# Patient Record
Sex: Female | Born: 1965 | Race: White | Hispanic: No | Marital: Single | Smoking: Current every day smoker
Health system: Southern US, Community
[De-identification: ages and names within clinical notes are randomized; demographics above are authoritative.]

## PROBLEM LIST (undated history)

## (undated) DIAGNOSIS — L0390 Cellulitis, unspecified: Secondary | ICD-10-CM

## (undated) DIAGNOSIS — J189 Pneumonia, unspecified organism: Secondary | ICD-10-CM

## (undated) DIAGNOSIS — Z87442 Personal history of urinary calculi: Secondary | ICD-10-CM

## (undated) DIAGNOSIS — J449 Chronic obstructive pulmonary disease, unspecified: Secondary | ICD-10-CM

## (undated) DIAGNOSIS — M1990 Unspecified osteoarthritis, unspecified site: Secondary | ICD-10-CM

## (undated) DIAGNOSIS — F32A Depression, unspecified: Secondary | ICD-10-CM

## (undated) DIAGNOSIS — F329 Major depressive disorder, single episode, unspecified: Secondary | ICD-10-CM

## (undated) DIAGNOSIS — I10 Essential (primary) hypertension: Secondary | ICD-10-CM

## (undated) HISTORY — PX: OTHER SURGICAL HISTORY: SHX169

## (undated) HISTORY — PX: CLEFT LIP REPAIR: SUR1164

## (undated) HISTORY — PX: FRACTURE SURGERY: SHX138

## (undated) HISTORY — PX: TYMPANOSTOMY TUBE PLACEMENT: SHX32

---
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
## 1998-12-09 DIAGNOSIS — Z9889 Other specified postprocedural states: Secondary | ICD-10-CM

## 1998-12-09 HISTORY — DX: Other specified postprocedural states: Z98.890

## 2004-07-14 ENCOUNTER — Other Ambulatory Visit: Payer: Self-pay

## 2009-01-25 ENCOUNTER — Ambulatory Visit: Payer: Self-pay

## 2009-02-02 ENCOUNTER — Ambulatory Visit: Payer: Self-pay

## 2009-08-04 ENCOUNTER — Ambulatory Visit: Payer: Self-pay

## 2010-01-30 ENCOUNTER — Ambulatory Visit: Payer: Self-pay

## 2011-02-06 ENCOUNTER — Emergency Department: Payer: Self-pay | Admitting: Emergency Medicine

## 2013-03-06 ENCOUNTER — Emergency Department: Payer: Self-pay | Admitting: Emergency Medicine

## 2013-03-06 LAB — CBC WITH DIFFERENTIAL/PLATELET
Basophil %: 1 %
Eosinophil #: 0.2 10*3/uL (ref 0.0–0.7)
Lymphocyte #: 1.6 10*3/uL (ref 1.0–3.6)
Lymphocyte %: 9.3 %
MCH: 31.3 pg (ref 26.0–34.0)
MCHC: 33.4 g/dL (ref 32.0–36.0)
Monocyte #: 0.5 x10 3/mm (ref 0.2–0.9)
Monocyte %: 3.2 %
Neutrophil #: 14.3 10*3/uL — ABNORMAL HIGH (ref 1.4–6.5)
Neutrophil %: 85.1 %
RDW: 13 % (ref 11.5–14.5)

## 2013-03-06 LAB — URINALYSIS, COMPLETE
Bacteria: NONE SEEN
Bilirubin,UR: NEGATIVE
Glucose,UR: NEGATIVE mg/dL (ref 0–75)
Ketone: NEGATIVE
Nitrite: NEGATIVE
Protein: 30
Squamous Epithelial: 1
WBC UR: 13 /HPF (ref 0–5)

## 2013-03-06 LAB — COMPREHENSIVE METABOLIC PANEL
Albumin: 4.1 g/dL (ref 3.4–5.0)
Alkaline Phosphatase: 123 U/L (ref 50–136)
Anion Gap: 4 — ABNORMAL LOW (ref 7–16)
BUN: 16 mg/dL (ref 7–18)
Bilirubin,Total: 0.5 mg/dL (ref 0.2–1.0)
Calcium, Total: 9.1 mg/dL (ref 8.5–10.1)
Co2: 30 mmol/L (ref 21–32)
Creatinine: 0.64 mg/dL (ref 0.60–1.30)
EGFR (African American): >60
Glucose: 113 mg/dL — ABNORMAL HIGH (ref 65–99)
Osmolality: 281 (ref 275–301)
SGPT (ALT): 16 U/L (ref 12–78)
Sodium: 140 mmol/L (ref 136–145)
Total Protein: 7.5 g/dL (ref 6.4–8.2)

## 2013-03-06 LAB — LIPASE, BLOOD: Lipase: 212 U/L (ref 73–393)

## 2013-04-06 ENCOUNTER — Emergency Department: Payer: Self-pay | Admitting: Emergency Medicine

## 2013-04-06 LAB — URINALYSIS, COMPLETE
Glucose,UR: NEGATIVE mg/dL (ref 0–75)
Protein: 100
Specific Gravity: 1.02 (ref 1.003–1.030)
Squamous Epithelial: 2

## 2013-04-06 LAB — BASIC METABOLIC PANEL
Calcium, Total: 9.1 mg/dL (ref 8.5–10.1)
Creatinine: 0.54 mg/dL — ABNORMAL LOW (ref 0.60–1.30)
EGFR (African American): >60
EGFR (Non-African Amer.): >60
Glucose: 75 mg/dL (ref 65–99)

## 2013-04-06 LAB — CBC
HCT: 46.3 % (ref 35.0–47.0)
HGB: 15.8 g/dL (ref 12.0–16.0)
MCHC: 34 g/dL (ref 32.0–36.0)
Platelet: 274 10*3/uL (ref 150–440)

## 2013-04-12 DIAGNOSIS — N200 Calculus of kidney: Secondary | ICD-10-CM

## 2013-04-12 HISTORY — DX: Calculus of kidney: N20.0

## 2013-04-21 ENCOUNTER — Ambulatory Visit: Payer: Self-pay | Admitting: Urology

## 2013-05-05 ENCOUNTER — Ambulatory Visit: Payer: Self-pay

## 2013-05-07 ENCOUNTER — Ambulatory Visit: Payer: Self-pay | Admitting: Urology

## 2013-06-07 ENCOUNTER — Ambulatory Visit: Payer: Self-pay | Admitting: Urology

## 2013-06-16 ENCOUNTER — Ambulatory Visit: Payer: Self-pay

## 2014-07-05 ENCOUNTER — Emergency Department: Payer: Self-pay | Admitting: Internal Medicine

## 2014-07-05 LAB — CBC
HCT: 43 % (ref 35.0–47.0)
HGB: 14.2 g/dL (ref 12.0–16.0)
MCH: 31.5 pg (ref 26.0–34.0)
MCHC: 32.9 g/dL (ref 32.0–36.0)
MCV: 96 fL (ref 80–100)
Platelet: 252 10*3/uL (ref 150–440)
RBC: 4.5 10*6/uL (ref 3.80–5.20)
RDW: 13.9 % (ref 11.5–14.5)
WBC: 8 10*3/uL (ref 3.6–11.0)

## 2014-07-05 LAB — COMPREHENSIVE METABOLIC PANEL
ALK PHOS: 80 U/L
AST: 14 U/L — AB (ref 15–37)
Albumin: 4.2 g/dL (ref 3.4–5.0)
Anion Gap: 7 (ref 7–16)
BILIRUBIN TOTAL: 0.5 mg/dL (ref 0.2–1.0)
BUN: 21 mg/dL — AB (ref 7–18)
CREATININE: 0.91 mg/dL (ref 0.60–1.30)
Calcium, Total: 9.3 mg/dL (ref 8.5–10.1)
Chloride: 105 mmol/L (ref 98–107)
Co2: 27 mmol/L (ref 21–32)
EGFR (Non-African Amer.): >60
GLUCOSE: 92 mg/dL (ref 65–99)
Osmolality: 280 (ref 275–301)
POTASSIUM: 3.8 mmol/L (ref 3.5–5.1)
SGPT (ALT): 18 U/L
Sodium: 139 mmol/L (ref 136–145)
TOTAL PROTEIN: 7.3 g/dL (ref 6.4–8.2)

## 2014-07-05 LAB — URINALYSIS, COMPLETE
BILIRUBIN, UR: NEGATIVE
Glucose,UR: NEGATIVE mg/dL (ref 0–75)
Ketone: NEGATIVE
Nitrite: NEGATIVE
Ph: 5 (ref 4.5–8.0)
Protein: 30
Specific Gravity: 1.026 (ref 1.003–1.030)
Squamous Epithelial: 2
WBC UR: 43 /HPF (ref 0–5)

## 2014-09-03 ENCOUNTER — Inpatient Hospital Stay: Payer: Self-pay | Admitting: Internal Medicine

## 2014-09-03 LAB — CBC WITH DIFFERENTIAL/PLATELET
BASOS PCT: 0.6 %
Basophil #: 0.2 10*3/uL — ABNORMAL HIGH (ref 0.0–0.1)
EOS ABS: 0 10*3/uL (ref 0.0–0.7)
Eosinophil %: 0 %
HCT: 43.2 % (ref 35.0–47.0)
HGB: 14.4 g/dL (ref 12.0–16.0)
Lymphocyte #: 0.3 10*3/uL — ABNORMAL LOW (ref 1.0–3.6)
Lymphocyte %: 1.1 %
MCH: 31.7 pg (ref 26.0–34.0)
MCHC: 33.3 g/dL (ref 32.0–36.0)
MCV: 95 fL (ref 80–100)
MONO ABS: 0.6 x10 3/mm (ref 0.2–0.9)
Monocyte %: 1.8 %
NEUTROS ABS: 29.8 10*3/uL — AB (ref 1.4–6.5)
NEUTROS PCT: 96.5 %
PLATELETS: 226 10*3/uL (ref 150–440)
RBC: 4.54 10*6/uL (ref 3.80–5.20)
RDW: 13.1 % (ref 11.5–14.5)
WBC: 30.9 10*3/uL — AB (ref 3.6–11.0)

## 2014-09-03 LAB — URINALYSIS, COMPLETE
BILIRUBIN, UR: NEGATIVE
Bacteria: NONE SEEN
Glucose,UR: NEGATIVE mg/dL (ref 0–75)
Hyaline Cast: 10
Ketone: NEGATIVE
Nitrite: NEGATIVE
PH: 5 (ref 4.5–8.0)
RBC,UR: 31 /HPF (ref 0–5)
Specific Gravity: 1.024 (ref 1.003–1.030)
Squamous Epithelial: 3

## 2014-09-03 LAB — COMPREHENSIVE METABOLIC PANEL
Albumin: 4.1 g/dL (ref 3.4–5.0)
Alkaline Phosphatase: 82 U/L
Anion Gap: 9 (ref 7–16)
BUN: 23 mg/dL — AB (ref 7–18)
Bilirubin,Total: 1.4 mg/dL — ABNORMAL HIGH (ref 0.2–1.0)
CALCIUM: 8.6 mg/dL (ref 8.5–10.1)
Chloride: 100 mmol/L (ref 98–107)
Co2: 26 mmol/L (ref 21–32)
Creatinine: 1.26 mg/dL (ref 0.60–1.30)
EGFR (Non-African Amer.): 48 — ABNORMAL LOW
GFR CALC AF AMER: 58 — AB
GLUCOSE: 136 mg/dL — AB (ref 65–99)
Osmolality: 276 (ref 275–301)
Potassium: 3.8 mmol/L (ref 3.5–5.1)
SGOT(AST): 17 U/L (ref 15–37)
SGPT (ALT): 20 U/L
SODIUM: 135 mmol/L — AB (ref 136–145)
TOTAL PROTEIN: 7.6 g/dL (ref 6.4–8.2)

## 2014-09-03 LAB — TROPONIN I: Troponin-I: <0.02 ng/mL

## 2014-09-04 LAB — CBC WITH DIFFERENTIAL/PLATELET
Basophil #: 0.1 10*3/uL (ref 0.0–0.1)
Basophil %: 0.4 %
Eosinophil #: 0.1 10*3/uL (ref 0.0–0.7)
Eosinophil %: 0.8 %
HCT: 34.5 % — AB (ref 35.0–47.0)
HGB: 11.4 g/dL — ABNORMAL LOW (ref 12.0–16.0)
LYMPHS ABS: 0.8 10*3/uL — AB (ref 1.0–3.6)
Lymphocyte %: 6.6 %
MCH: 32.1 pg (ref 26.0–34.0)
MCHC: 33.2 g/dL (ref 32.0–36.0)
MCV: 97 fL (ref 80–100)
Monocyte #: 0.3 x10 3/mm (ref 0.2–0.9)
Monocyte %: 2.4 %
Neutrophil #: 11.3 10*3/uL — ABNORMAL HIGH (ref 1.4–6.5)
Neutrophil %: 89.8 %
Platelet: 155 10*3/uL (ref 150–440)
RBC: 3.57 10*6/uL — ABNORMAL LOW (ref 3.80–5.20)
RDW: 13.3 % (ref 11.5–14.5)
WBC: 12.6 10*3/uL — ABNORMAL HIGH (ref 3.6–11.0)

## 2014-09-04 LAB — BASIC METABOLIC PANEL
Anion Gap: 4 — ABNORMAL LOW (ref 7–16)
BUN: 12 mg/dL (ref 7–18)
Calcium, Total: 7.8 mg/dL — ABNORMAL LOW (ref 8.5–10.1)
Chloride: 109 mmol/L — ABNORMAL HIGH (ref 98–107)
Co2: 25 mmol/L (ref 21–32)
Creatinine: 0.7 mg/dL (ref 0.60–1.30)
EGFR (African American): >60
Glucose: 90 mg/dL (ref 65–99)
OSMOLALITY: 275 (ref 275–301)
Potassium: 3.9 mmol/L (ref 3.5–5.1)
Sodium: 138 mmol/L (ref 136–145)

## 2014-09-04 LAB — URINE CULTURE

## 2014-09-05 LAB — VANCOMYCIN, TROUGH: Vancomycin, Trough: 2 ug/mL — ABNORMAL LOW (ref 10–20)

## 2014-09-06 LAB — CBC WITH DIFFERENTIAL/PLATELET
BASOS PCT: 1.1 %
Basophil #: 0.1 10*3/uL (ref 0.0–0.1)
Eosinophil #: 0.3 10*3/uL (ref 0.0–0.7)
Eosinophil %: 4 %
HCT: 35.7 % (ref 35.0–47.0)
HGB: 11.7 g/dL — ABNORMAL LOW (ref 12.0–16.0)
LYMPHS ABS: 1.6 10*3/uL (ref 1.0–3.6)
LYMPHS PCT: 25.5 %
MCH: 31.4 pg (ref 26.0–34.0)
MCHC: 32.9 g/dL (ref 32.0–36.0)
MCV: 96 fL (ref 80–100)
MONOS PCT: 8.2 %
Monocyte #: 0.5 x10 3/mm (ref 0.2–0.9)
NEUTROS PCT: 61.2 %
Neutrophil #: 3.9 10*3/uL (ref 1.4–6.5)
PLATELETS: 184 10*3/uL (ref 150–440)
RBC: 3.73 10*6/uL — ABNORMAL LOW (ref 3.80–5.20)
RDW: 13 % (ref 11.5–14.5)
WBC: 6.4 10*3/uL (ref 3.6–11.0)

## 2014-09-06 LAB — BASIC METABOLIC PANEL
ANION GAP: 4 — AB (ref 7–16)
BUN: 15 mg/dL (ref 7–18)
CALCIUM: 8.2 mg/dL — AB (ref 8.5–10.1)
Chloride: 110 mmol/L — ABNORMAL HIGH (ref 98–107)
Co2: 27 mmol/L (ref 21–32)
Creatinine: 0.63 mg/dL (ref 0.60–1.30)
EGFR (African American): >60
EGFR (Non-African Amer.): >60
Glucose: 56 mg/dL — ABNORMAL LOW (ref 65–99)
Osmolality: 280 (ref 275–301)
Potassium: 3.5 mmol/L (ref 3.5–5.1)
SODIUM: 141 mmol/L (ref 136–145)

## 2014-09-08 LAB — CULTURE, BLOOD (SINGLE)

## 2014-10-10 ENCOUNTER — Emergency Department: Payer: Self-pay | Admitting: Emergency Medicine

## 2015-03-31 NOTE — Op Note (Signed)
PATIENT NAME:  Deanna Ramsey, Deanna Ramsey MR#:  [id number] DATE OF BIRTH:  09-02-1966  DATE OF SURGERY:  04/21/2013  PREOPERATIVE DIAGNOSIS: Right renal pelvic calculus.  POSTOPERATIVE DIAGNOSIS: Right renal pelvic calculus.  PROCEDURES: 1.  Right ureteropyeloscopy, with holmium laser lithotripsy.  2.  Placement, right ureteral stent.   SURGEON: John Giovanni, M.D.   ASSISTANT: None.   ANESTHESIA: General.   INDICATIONS: A 49 year old female who presented to the Emergency Department with right renal colic. CT scan showed ab approximately 15 mm right renal pelvic stone. After discussion of our of treatment options, she initially elected lithotripsy, however due to financial constraints has elected to proceed with ureteroscopy.   DESCRIPTION: She was taken to the cystoscopy suite and placed in supine position. General anesthetic was administered via an endotracheal tube. She was placed in the low lithotomy position and her external genitalia were prepped and draped in the usual fashion. A time-out was performed per protocol, with all in agreement. A 21-French cystoscope sheath with obturator was lubricated and passed without difficulty. Panendoscopy was performed, and the bladder mucosa was normal in appearance, without erythema, solid or papillary lesions. The left ureteral orifice was effluxing clear urine. No efflux was seen from the right ureteral orifice.   A 0.035 guidewire was placed through the cystoscope and into the right ureter and passed up into the right renal pelvis past the stone without difficulty. A second 0.035 guidewire was passed and the cystoscope was removed. A ureteral access sheath would not advance beyond the ureteral orifice, and was buckling. The distal ureter subsequently dilated with over-the-wire ureteral dilators from 8- to 14-French. The ureteral access sheath would still not advance. A flexible ureteroscope was then placed over the wire and advanced up the ureter without  difficulty into the renal pelvis. A crystalline stone was easily identified in the renal pelvis. A 276-micron holmium laser fiber was placed through the ureteroscope. The stone was easily fragmented in multiple small fragments at a power setting of 5 watts. Under direct vision and fluoroscopy, no large fragments were identified. The ureteroscope was removed. A 6-French, 24 cm Microvasive Contour ureteral stent was placed. The proximal end of the stent was well-positioned in the renal pelvis. The distal end of the stent was well-positioned in the bladder under direct vision. A B and O suppository was placed per rectum. She was taken to the PACU in stable condition. There were no complications.   EBL minimal.     ____________________________ Ronda Fairly. Bernardo Heater, MD scs:dm D: 04/22/2013 12:40:00 ET T: 04/22/2013 14:35:48 ET JOB#: [id number]  cc: Nicki Reaper C. Bernardo Heater, MD, <Dictator> Abbie Sons MD ELECTRONICALLY SIGNED 04/23/2013 7:40

## 2015-04-01 NOTE — H&P (Signed)
PATIENT NAME:  Deanna Ramsey, MEINERS MR#:  [id number] DATE OF BIRTH:  [birth year]  DATE OF ADMISSION:  09/03/2014  ADMITTING PHYSICIAN: Gladstone Lighter, MD.    PRIMARY CARE PHYSICIAN: None.   CHIEF COMPLAINT: Pain, fever, and chills.   HISTORY OF PRESENT ILLNESS: Deanna Ramsey is a 49 year old Caucasian female with no significant past medical history who presents from home secondary to fever and chills that started yesterday. The patient said she felt like she wore some tight jeans the day before yesterday and had some itching and tenderness in her pubic area. Yesterday morning she woke up with fever, chills, and noted that she could not walk because of painful lymph nodes in her groin. She noticed last night that she had redness and rash in her pubic area extending into the perineum that she was not able to see, and presented to the hospital this morning. She has a fever of 99.6, elevated lactic, tachycardia, and hypertension, is being admitted for sepsis.   PAST MEDICAL HISTORY: None. No chronic medical problems.   PAST SURGICAL HISTORY:  1.  She had right cervical lymph nodes taken out of uncertain etiology.  2.  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   3.  Cleft lip surgery.  4.  Lithotripsies, a few.   MEDICATIONS AT HOME: None.   ALLERGIES TO MEDICATIONS: SULFA.    SOCIAL HISTORY: Lives at home with her brother-in-law. Smokes about 1 pack per day of cigarettes. Denies any alcohol use. Works at an assisted living facility.   FAMILY HISTORY: Both parents with COPD and they have passed away.    REVIEW OF SYSTEMS:  CONSTITUTIONAL: Positive for fever, fatigue, weakness. No weight loss or weight gain.  EYES: No blurred vision, double vision, inflammation, or glaucoma.  ENT: No tinnitus, ear pain, hearing loss, epistaxis, or discharge.  RESPIRATORY: No cough, wheeze, hemoptysis, or COPD.   CARDIOVASCULAR: No chest pain, orthopnea, edema, arrhythmia,  palpitations, or syncope.  GASTROINTESTINAL: Positive for nausea. No vomiting, diarrhea, abdominal pain, hematemesis, or melena.  GENITOURINARY: Positive for dysuria. No nocturia, renal calculus, frequency or incontinence.  ENDOCRINE: No polyuria, nocturia, thyroid problems, heat or cold intolerance.  HEMATOLOGY: No anemia, easy bruising or bleeding.  SKIN: No acne, rash, or lesions.  MUSCULOSKELETAL: No neck, back, shoulder pain, arthritis, or gout.  NEUROLOGIC: No numbness, weakness, CVA, TIA, or seizures.  PSYCHOLOGICAL: No anxiety, insomnia, depression.   PHYSICAL EXAMINATION: VITAL SIGNS: Temperature 99.6 degrees Fahrenheit, pulse 97, respirations 18, blood pressure 134/77, pulse oximetry 99% on room air.  GENERAL: Well-built, well-nourished female lying in bed in mild acute distress secondary to pain in the groin region.  HEENT: Normocephalic, atraumatic. Pupils equal, round, reacting to light. Anicteric sclerae. Extraocular movements intact.  OROPHARYNX: Clear without erythema, mass, or exudates.  NECK: Supple. No thyromegaly, JVD, or carotid bruits. No lymphadenopathy.  LUNGS: Moving air bilaterally. No wheeze or crackles. No use of accessory muscles for breathing.  CARDIOVASCULAR: S1, S2, regular rate and rhythm. No murmurs, rubs, or gallops.  ABDOMEN: Soft, nontender, nondistended. No hepatosplenomegaly. Normal bowel sounds.  EXTREMITIES: No pedal edema. No clubbing or cyanosis, 2+ dorsalis pedis pulses palpable bilaterally.  SKIN: She has well-defined erythematous rash on the pubic area extending into the lower abdomen, but going more towards the perineum involving her labia going posteriorly. No perianal rash noted.  LYMPHATICS: She has tender inguinal lymphadenopathy. No vaginal discharge noted.  NEUROLOGIC: Cranial nerves intact. No focal motor or sensory deficits.  PSYCHOLOGICAL: The patient is awake, alert, oriented x 3.   LABORATORY DATA: WBC 30.9, hemoglobin 14.4,  hematocrit 43.2, platelet count 226,000.  Sodium 135, potassium 3.8, chloride 100, bicarbonate 26, BUN 23, creatinine 1.2, glucose 136, and calcium of 8.6.   ALT 20, AST 17, alkaline phosphatase 82, total bilirubin 1.4, albumin of 4.1. Troponin is negative.   Chest x-ray, clear lung fields. No acute cardiopulmonary disease.   Urinalysis with 37 WBCs, 2+ leukocyte esterase. Lactic acid slightly elevated at 2.4. CT of the abdomen and pelvis showing skin subcutaneous edema in the anterior pelvic region. No acute abnormality otherwise in the abdomen or pelvis. Multiple cysts in the kidneys. Nonobstructive kidney stones noted.   BRIEF ASSESSMENT AND PLAN: A 49 year old female with no chronic medical problems being admitted for sepsis.  1.  Sepsis, likely from cellulitis of the pubic area extending into the perineum. IV fluids. Blood cultures have been ordered and also start her on IV antibiotics with vancomycin and Zosyn. Urine slightly infected. Follow up cultures on antibiotics.  2.  Tobacco use disorder. Counseled against smoking for 3 minutes. Will start on Nicotrol inhaler.  3.  Deep vein thrombosis prophylaxis with subcutaneous heparin.   CODE STATUS: Full code.   TIME SPENT ON ADMISSION: 50 minutes.    ____________________________ Gladstone Lighter, MD rk:at D: 09/03/2014 12:12:15 ET T: 09/03/2014 12:51:28 ET JOB#: [id number]  cc: Gladstone Lighter, MD, <Dictator> Gladstone Lighter MD ELECTRONICALLY SIGNED 09/08/2014 9:51

## 2015-04-01 NOTE — Discharge Summary (Signed)
PATIENT NAME:  Deanna Ramsey, Deanna Ramsey MR#:  [id number] DATE OF BIRTH:  1966/08/12  DATE OF ADMISSION:  09/03/2014 DATE OF DISCHARGE:  09/06/2014  ADMITTING DIAGNOSES:   1.  Sepsis secondary to cellulitis of the perineal area.  2.  Tobacco use disorder.   DISCHARGE DIAGNOSES:  1.  Sepsis from perineal area cellulitis with edema, significantly improved with IV antibiotics, Zosyn and vancomycin.  2.  Tobacco use disorder. Consultation to quit smoking.   CONSULTATIONS: None.   PROCEDURES: None.   HOSPITAL COURSE: The patient is a 49 year old Caucasian female with no significant past medical history. She came to the ED with a chief complaint of fever, pain and chills. The patient was complaining of itching in the perineal area and was having difficulty while walking. Please see history and physical for details.   The patient was admitted to the hospital with a diagnosis of cellulitis. She was started on IV antibiotics, vancomycin and Zosyn. Cultures were obtained.   HOSPITAL COURSE:  With IV Zosyn and vancomycin her cellulitis significantly improved. Erythema and tenderness improved. Blood cultures were negative and urine cultures were  contaminated. Benadryl was provided for itching. As her clinical situation significantly improved IV antibiotics were switched to p.o. antibiotics and the patient is to discharged home with doxycycline and probiotic.   The patient was counseled to quit smoking and recommended to continue nicotine inhalation device.   Overall her condition was stable condition. Condition at the time of discharge stable.   LABORATORY AND IMAGING STUDIES: WBC 30.9 at the time of admission, 6.4 on September 29, hemoglobin 11.3, hematocrit 35.7. Blood culture no growth x 2. Lactic acid at the time of admission 2.4. BMP: Glucose 56, BUN, creatinine, sodium, and potassium are normal. Anion gap is at 4, calcium 8.2, serum osmolality normal.   Urine culture mixed bacteria which is probably a  contaminant.   MEDICATIONS AT THE TIME OF DISCHARGE: Doxycycline 100 mg p.o. q. 12 hours for 7 days, probiotic 1 capsule p.o. 2 times a day for 10 days, nicotine inhaler  to be continued. Benadryl 25 mg p.o. q. 6 hours as needed for itching, ibuprofen 400 mg p.o. every 6 hours as needed for pain and swelling with food, Colace 100 mg p.o. 2 times a day as needed for constipation.   OUTPATIENT FOLLOWUP: With new primary care physician in 1 week. Return to work in 5 days, Next Monday, October 5.  Work note was given.   ACTIVITY: As tolerated.   DIET: Regular diet.   Plan of care discussed with the patient. She is aware of the plan.   Total time spent on the discharge: 45 minutes.    ____________________________ Nicholes Mango, MD ag:lt D: 09/06/2014 13:12:09 ET T: 09/06/2014 14:20:05 ET JOB#: [id number]  cc: Nicholes Mango, MD, <Dictator> Nicholes Mango MD ELECTRONICALLY SIGNED 09/09/2014 11:17

## 2015-04-15 ENCOUNTER — Inpatient Hospital Stay: Payer: Self-pay

## 2015-04-15 ENCOUNTER — Inpatient Hospital Stay
Admission: EM | Admit: 2015-04-15 | Discharge: 2015-04-18 | DRG: 872 | Disposition: A | Discharge status: Home / Self Care | Payer: Self-pay | Attending: Internal Medicine | Admitting: Internal Medicine

## 2015-04-15 ENCOUNTER — Encounter: Payer: Self-pay | Admitting: Emergency Medicine

## 2015-04-15 DIAGNOSIS — D72829 Elevated white blood cell count, unspecified: Secondary | ICD-10-CM | POA: Diagnosis present

## 2015-04-15 DIAGNOSIS — L03315 Cellulitis of perineum: Secondary | ICD-10-CM

## 2015-04-15 DIAGNOSIS — A400 Sepsis due to streptococcus, group A: Principal | ICD-10-CM | POA: Diagnosis present

## 2015-04-15 DIAGNOSIS — F329 Major depressive disorder, single episode, unspecified: Secondary | ICD-10-CM | POA: Diagnosis present

## 2015-04-15 DIAGNOSIS — F17210 Nicotine dependence, cigarettes, uncomplicated: Secondary | ICD-10-CM | POA: Diagnosis present

## 2015-04-15 DIAGNOSIS — M1990 Unspecified osteoarthritis, unspecified site: Secondary | ICD-10-CM | POA: Diagnosis present

## 2015-04-15 DIAGNOSIS — E876 Hypokalemia: Secondary | ICD-10-CM | POA: Diagnosis present

## 2015-04-15 DIAGNOSIS — L03314 Cellulitis of groin: Secondary | ICD-10-CM | POA: Diagnosis present

## 2015-04-15 DIAGNOSIS — L0390 Cellulitis, unspecified: Secondary | ICD-10-CM | POA: Diagnosis present

## 2015-04-15 DIAGNOSIS — Z882 Allergy status to sulfonamides status: Secondary | ICD-10-CM

## 2015-04-15 HISTORY — DX: Depression, unspecified: F32.A

## 2015-04-15 HISTORY — DX: Major depressive disorder, single episode, unspecified: F32.9

## 2015-04-15 HISTORY — DX: Cellulitis, unspecified: L03.90

## 2015-04-15 HISTORY — DX: Unspecified osteoarthritis, unspecified site: M19.90

## 2015-04-15 LAB — BASIC METABOLIC PANEL
ANION GAP: 9 (ref 5–15)
BUN: 21 mg/dL — ABNORMAL HIGH (ref 6–20)
CHLORIDE: 102 mmol/L (ref 101–111)
CO2: 25 mmol/L (ref 22–32)
CREATININE: 0.77 mg/dL (ref 0.44–1.00)
Calcium: 8.8 mg/dL — ABNORMAL LOW (ref 8.9–10.3)
GFR calc Af Amer: >60 mL/min (ref >60)
GFR calc non Af Amer: >60 mL/min (ref >60)
Glucose, Bld: 96 mg/dL (ref 65–99)
Potassium: 4 mmol/L (ref 3.5–5.1)
SODIUM: 136 mmol/L (ref 135–145)

## 2015-04-15 LAB — CBC
HEMATOCRIT: 45.9 % (ref 35.0–47.0)
HEMOGLOBIN: 15 g/dL (ref 12.0–16.0)
MCH: 31.4 pg (ref 26.0–34.0)
MCHC: 32.8 g/dL (ref 32.0–36.0)
MCV: 95.9 fL (ref 80.0–100.0)
Platelets: 322 10*3/uL (ref 150–440)
RBC: 4.79 MIL/uL (ref 3.80–5.20)
RDW: 12.6 % (ref 11.5–14.5)
WBC: 16.6 10*3/uL — ABNORMAL HIGH (ref 3.6–11.0)

## 2015-04-15 MED ORDER — ONDANSETRON HCL 4 MG PO TABS
4.0000 mg | ORAL_TABLET | Freq: Four times a day (QID) | ORAL | Status: DC | PRN
  Filled 2015-04-15: qty 1

## 2015-04-15 MED ORDER — MORPHINE SULFATE 2 MG/ML IJ SOLN
2.0000 mg | INTRAMUSCULAR | Status: DC | PRN
  Administered 2015-04-16 – 2015-04-18 (×13): 2 mg via INTRAVENOUS
  Filled 2015-04-15 (×13): qty 1

## 2015-04-15 MED ORDER — VANCOMYCIN HCL IN DEXTROSE 1-5 GM/200ML-% IV SOLN
INTRAVENOUS | Status: AC
  Filled 2015-04-15: qty 200

## 2015-04-15 MED ORDER — HEPARIN SODIUM (PORCINE) 5000 UNIT/ML IJ SOLN
5000.0000 [IU] | Freq: Three times a day (TID) | INTRAMUSCULAR | Status: DC
  Administered 2015-04-15 – 2015-04-18 (×8): 5000 [IU] via SUBCUTANEOUS
  Filled 2015-04-15 (×12): qty 1

## 2015-04-15 MED ORDER — OXYCODONE-ACETAMINOPHEN 5-325 MG PO TABS
ORAL_TABLET | ORAL | Status: AC
  Filled 2015-04-15: qty 1

## 2015-04-15 MED ORDER — SODIUM CHLORIDE 0.9 % IV BOLUS (SEPSIS)
1000.0000 mL | Freq: Once | INTRAVENOUS | Status: AC
  Administered 2015-04-15: 1000 mL via INTRAVENOUS

## 2015-04-15 MED ORDER — ONDANSETRON HCL 4 MG/2ML IJ SOLN
4.0000 mg | Freq: Once | INTRAMUSCULAR | Status: AC
  Administered 2015-04-15: 4 mg via INTRAVENOUS

## 2015-04-15 MED ORDER — MORPHINE SULFATE 2 MG/ML IJ SOLN
2.0000 mg | Freq: Once | INTRAMUSCULAR | Status: AC
  Administered 2015-04-15: 2 mg via INTRAVENOUS

## 2015-04-15 MED ORDER — VANCOMYCIN HCL IN DEXTROSE 1-5 GM/200ML-% IV SOLN
1000.0000 mg | Freq: Once | INTRAVENOUS | Status: AC
  Administered 2015-04-15: 1000 mg via INTRAVENOUS

## 2015-04-15 MED ORDER — ONDANSETRON HCL 4 MG/2ML IJ SOLN
4.0000 mg | Freq: Four times a day (QID) | INTRAMUSCULAR | Status: DC | PRN
  Administered 2015-04-16 – 2015-04-17 (×3): 4 mg via INTRAVENOUS
  Filled 2015-04-15 (×3): qty 2

## 2015-04-15 MED ORDER — IOHEXOL 300 MG/ML  SOLN
100.0000 mL | Freq: Once | INTRAMUSCULAR | Status: AC | PRN
  Administered 2015-04-15: 100 mL via INTRAVENOUS

## 2015-04-15 MED ORDER — SODIUM CHLORIDE 0.9 % IV SOLN
INTRAVENOUS | Status: DC
  Administered 2015-04-15 – 2015-04-16 (×2): via INTRAVENOUS
  Administered 2015-04-16: 1000 mL via INTRAVENOUS
  Administered 2015-04-17 – 2015-04-18 (×3): via INTRAVENOUS

## 2015-04-15 MED ORDER — PIPERACILLIN-TAZOBACTAM 3.375 G IVPB
3.3750 g | Freq: Once | INTRAVENOUS | Status: AC
  Administered 2015-04-15: 3.375 g via INTRAVENOUS

## 2015-04-15 MED ORDER — ACETAMINOPHEN 325 MG PO TABS
650.0000 mg | ORAL_TABLET | Freq: Four times a day (QID) | ORAL | Status: DC | PRN
  Administered 2015-04-17 – 2015-04-18 (×2): 650 mg via ORAL
  Filled 2015-04-15 (×2): qty 2

## 2015-04-15 MED ORDER — PIPERACILLIN-TAZOBACTAM 3.375 G IVPB
INTRAVENOUS | Status: AC
  Filled 2015-04-15: qty 50

## 2015-04-15 MED ORDER — ACETAMINOPHEN 650 MG RE SUPP
650.0000 mg | Freq: Four times a day (QID) | RECTAL | Status: DC | PRN
  Filled 2015-04-15: qty 1

## 2015-04-15 MED ORDER — ONDANSETRON HCL 4 MG/2ML IJ SOLN
INTRAMUSCULAR | Status: AC
  Filled 2015-04-15: qty 2

## 2015-04-15 MED ORDER — OXYCODONE-ACETAMINOPHEN 5-325 MG PO TABS
1.0000 | ORAL_TABLET | Freq: Once | ORAL | Status: AC
  Administered 2015-04-15: 1 via ORAL

## 2015-04-15 MED ORDER — VANCOMYCIN HCL IN DEXTROSE 1-5 GM/200ML-% IV SOLN: 1000.0000 mg | INTRAVENOUS | Status: DC

## 2015-04-15 MED ORDER — MORPHINE SULFATE 2 MG/ML IJ SOLN
INTRAMUSCULAR | Status: AC
  Filled 2015-04-15: qty 1

## 2015-04-15 NOTE — ED Notes (Signed)
Redness over mons pubis and perineal area, states was hospitalized in September for the same

## 2015-04-15 NOTE — ED Provider Notes (Signed)
[facility] Emergency Department Provider Note  ____________________________________________  Time seen: 8:15 PM  I have reviewed the triage vital signs and the nursing notes.   HISTORY  Chief Complaint Cellulitis      HPI Deanna Ramsey is a 49 y.o. female presents with "cellulitis on pelvis. Patient states redness pain noted suprapubic and extending down towards the vagina. Fever at home. Of note patient recalls similar incident of same was diagnosis cellulitis to progress to sepsis. Pain at present 10 out of 10.     Past Medical History  Diagnosis Date  . Depression   . Cellulitis     Patient Active Problem List   Diagnosis Date Noted  . Panniculitis 04/15/2015    No past surgical history on file.  No current outpatient prescriptions on file.  Allergies Sulfa antibiotics  No family history on file.  Social History History  Substance Use Topics  . Smoking status: Current Every Day Smoker -- 1.00 packs/day    Types: Cigarettes  . Smokeless tobacco: Not on file  . Alcohol Use: No    Review of Systems  Constitutional: Negative for fever. Eyes: Negative for visual changes. ENT: Negative for sore throat. Cardiovascular: Negative for chest pain. Respiratory: Negative for shortness of breath. Gastrointestinal: Negative for abdominal pain, vomiting and diarrhea. Genitourinary: Negative for dysuria. Musculoskeletal: Negative for back pain. Skin: Negative for rash. Neurological: Negative for headaches, focal weakness or numbness. Psychiatric: Unremarkable  10-point ROS otherwise negative.  ____________________________________________   PHYSICAL EXAM:  VITAL SIGNS: ED Triage Vitals  Enc Vitals Group     BP 04/15/15 1624 162/90 mmHg     Pulse Rate 04/15/15 1624 98     Resp 04/15/15 1624 19     Temp 04/15/15 1624 100.8 F (38.2 C)     Temp Source 04/15/15 1624 Oral     SpO2 04/15/15 1624 98 %     Weight 04/15/15 1624 108 lb  (48.988 kg)     Height 04/15/15 1624 5\' 5"  (1.651 m)     Head Cir --      Peak Flow --      Pain Score 04/15/15 1626 8     Pain Loc --      Pain Edu? --      Excl. in [place]? --      Constitutional: Alert and oriented. Well appearing and in no distress. Eyes: Conjunctivae are normal. PERRL. Normal extraocular movements. ENT   Head: Normocephalic and atraumatic.   Nose: No congestion/rhinnorhea.   Mouth/Throat: Mucous membranes are moist.   Neck: No stridor. Hematological/Lymphatic/Immunilogical: No cervical lymphadenopathy. Cardiovascular: Normal rate, regular rhythm. Normal and symmetric distal pulses are present in all extremities. No murmurs, rubs, or gallops. Respiratory: Normal respiratory effort without tachypnea nor retractions. Breath sounds are clear and equal bilaterally. No wheezes/rales/rhonchi. Gastrointestinal: Soft and nontender. No distention. There is no CVA tenderness. Genitourinary: deferred Musculoskeletal: Nontender with normal range of motion in all extremities. No joint effusions.  No lower extremity tenderness nor edema. Neurologic:  Normal speech and language. No gross focal neurologic deficits are appreciated. Speech is normal.  Skin:  Cellulitis suprapubic just inferior to the umbilicus extending down to the perineum Psychiatric: Mood and affect are normal. Speech and behavior are normal. Patient exhibits appropriate insight and judgment.  ____________________________________________    LABS (pertinent positives/negatives)  Labs Reviewed  CBC - Abnormal; Notable for the following:    WBC 16.6 (*)    All other components within normal limits  BASIC METABOLIC PANEL - Abnormal; Notable for the following:    BUN 21 (*)    Calcium 8.8 (*)    All other components within normal limits  CULTURE, BLOOD (ROUTINE X 2)  CULTURE, BLOOD (ROUTINE X 2)  CBC  CREATININE, SERUM  BASIC METABOLIC PANEL  CBC      ____________________________________________   EKG  None performed  ____________________________________________    RADIOLOGY  Performed  ____________________________________________  Critical care note: 30 minutes  ____________________________________________   INITIAL IMPRESSION / ASSESSMENT AND PLAN / ED COURSE  Pertinent labs & imaging results that were available during my care of the patient were reviewed by me and considered in my medical decision making (see chart for details).  Given history and physical exam consistent with cellulitis of the perineum IV vancomycin 1 g and IV Zosyn 3.375 mg given patient noted to be tachycardic and febrile. Concerns for SIRS ____________________________________________   FINAL CLINICAL IMPRESSION(S) / ED DIAGNOSES  Final diagnoses:  Cellulitis of perineum      Gregor Hams, MD 04/19/15 [phone]

## 2015-04-15 NOTE — H&P (Signed)
[place] [facility] [place] NAME: Deanna Ramsey    MR#:  [id number]  DATE OF BIRTH:  02/25/1966   DATE OF ADMISSION:  04/15/2015  PRIMARY CARE PHYSICIAN: No primary care provider on file.   REQUESTING/REFERRING PHYSICIAN: Owens Shark  CHIEF COMPLAINT:   Chief Complaint  Patient presents with  . Cellulitis    cellulitis groin area noted this am, history of sepsis with same type of symptoms 9/16    HISTORY OF PRESENT ILLNESS:  Deanna Ramsey  is a 49 y.o. female without significant past medical history presenting with skin redness. She describes one day duration of redness around her vagina moving proximally, warm to touch, painful, described as only "pain" intensity 10 of 10 nonradiating, worsened with movements, no relieving factors. Also has subjective fevers chills and nausea without vomiting. Emergency department course noted to have elevated white count as well as febrile  PAST MEDICAL HISTORY:   Past Medical History  Diagnosis Date  . Depression   . Cellulitis     PAST SURGICAL HISTORY:  History reviewed. No pertinent past surgical history.  SOCIAL HISTORY:   History  Substance Use Topics  . Smoking status: Current Every Day Smoker -- 1.00 packs/day    Types: Cigarettes  . Smokeless tobacco: Not on file  . Alcohol Use: No    FAMILY HISTORY:   Family History  Problem Relation Age of Onset  . COPD Other     DRUG ALLERGIES:   Allergies  Allergen Reactions  . Sulfa Antibiotics Nausea And Vomiting    REVIEW OF SYSTEMS:  REVIEW OF SYSTEMS:  CONSTITUTIONAL:Positive fors, chills, fatigue, weakness.  EYES: Denies blurred vision, double vision, or eye pain.  EARS, NOSE, THROAT: Denies tinnitus, ear pain, hearing loss.  RESPIRATORY: denies cough, shortness of breath, wheezing  CARDIOVASCULAR: Denies chest pain, palpitations, edema.  GASTROINTESTINAL:Positive nausea,  denies vomiting, diarrhea,positive for  abdominal pain as  described above  GENITOURINARY: Denies dysuria, hematuria. No vaginal discharge  ENDOCRINE: Denies nocturia or thyroid problems. HEMATOLOGIC AND LYMPHATIC: Denies easy bruising or bleeding.  SKIN:Positive for erythematous lesion as stated above no further skin changes Musculoskeletal: Denies pain in neck, back, shoulder, knees, hips, or further arthritic symptoms.  NEUROLOGIC: Denies paralysis, paresthesias.  PSYCHIATRIC: Denies anxiety or depressive symptoms. Otherwise full review of systems performed by me is negative.   MEDICATIONS AT HOME:   Prior to Admission medications   Not on File      VITAL SIGNS:  Blood pressure 109/52, pulse 87, temperature 100.3 F (37.9 C), temperature source Oral, resp. rate 20, height 5\' 5"  (1.651 m), weight 108 lb (48.988 kg), SpO2 97 %.  PHYSICAL EXAMINATION:  VITAL SIGNS: Filed Vitals:   04/15/15 2012  BP: 109/52  Pulse: 87  Temp:   Resp: 49   GENERAL:48 y.o.female currently in no acute distress.  HEAD: Normocephalic, atraumatic.  EYES: Pupils equal, round, reactive to light. Extraocular muscles intact. No scleral icterus.  MOUTH:Dry mucosall membrane. Dentition poor. No abscess noted.  EAR, NOSE, THROAT: Clear without exudates. No external lesions.  NECK: Supple. No thyromegaly. No nodules. No JVD.  PULMONARY: Clear to ascultation, without wheeze rails or rhonci. No use of accessory muscles, Good respiratory effort. good air entry bilaterally CHEST: Nontender to palpation.  CARDIOVASCULAR: S1 and S2. Regular rate and rhythm. No murmurs, rubs, or gallops. No edema. Pedal pulses 2+ bilaterally.  GASTROINTESTINAL: Soft, nontender, nondistended. No masses. Positive bowel sounds. No hepatosplenomegaly.  MUSCULOSKELETAL: No swelling,  clubbing, or edema. Range of motion full in all extremities.  NEUROLOGIC: Cranial nerves II through XII are intact. No gross focal neurological deficits. Sensation intact. Reflexes intact.  SKIN:Erythematous  lesion starting at vagina moving proximally to suprapubic region warm to touch tenderness on palpation associated painful femoral lymphadenopathy otherwise  Skin warm and dry. Turgor intact.  PSYCHIATRIC: Mood, affect within normal limits. The patient is awake, alert and oriented x 3. Insight, judgment intact.    LABORATORY PANEL:   CBC  Recent Labs Lab 04/15/15 1640  WBC 16.6*  HGB 15.0  HCT 45.9  PLT 322   ------------------------------------------------------------------------------------------------------------------  Chemistries   Recent Labs Lab 04/15/15 1640  NA 136  K 4.0  CL 102  CO2 25  GLUCOSE 96  BUN 21*  CREATININE 0.77  CALCIUM 8.8*   ------------------------------------------------------------------------------------------------------------------  Cardiac Enzymes No results for input(s): TROPONINI in the last 168 hours. ------------------------------------------------------------------------------------------------------------------  RADIOLOGY:  No results found.  EKG:  No orders found for this or any previous visit.  IMPRESSION AND PLAN:   49 year old Caucasian female without significant past medical history presenting with a one-day duration of skin redness and pain.  1. Cellulitis, suprapubic: Received vancomycin as well as Zosyn by emergency department staff, continue vancomycin coverage adjust antibiotics according to culture data when it returns, provide pain medication as required, IV fluid hydration to keep mean arterial pressure greater than 65. 2. Venous thromboembolism prophylactic: Heparin subcutaneous    All the records are reviewed and case discussed with ED provider. Management plans discussed with the patient, family and they are in agreement.  CODE STATUS: Full  TOTAL TIME TAKING CARE OF THIS PATIENT: 35 minutes.    Dezaray Shibuya,  Karenann Cai.D on 04/15/2015 at 8:59 PM  Between 7am to 6pm - Pager - [phone]  After 6pm: House  Pager: - [phone]  [facility]  Office  [phone]  CC: Primary care physician; No primary care provider on file.

## 2015-04-15 NOTE — ED Notes (Signed)
MD Brown at bedside.

## 2015-04-15 NOTE — ED Notes (Signed)
Pt requesting additional zofran, given per protocol order.

## 2015-04-16 ENCOUNTER — Encounter: Payer: Self-pay | Admitting: *Deleted

## 2015-04-16 LAB — CBC
HEMATOCRIT: 36.9 % (ref 35.0–47.0)
HEMOGLOBIN: 12.5 g/dL (ref 12.0–16.0)
MCH: 32.4 pg (ref 26.0–34.0)
MCHC: 34 g/dL (ref 32.0–36.0)
MCV: 95.3 fL (ref 80.0–100.0)
Platelets: 215 10*3/uL (ref 150–440)
RBC: 3.87 MIL/uL (ref 3.80–5.20)
RDW: 12.8 % (ref 11.5–14.5)
WBC: 27.1 10*3/uL — AB (ref 3.6–11.0)

## 2015-04-16 LAB — BASIC METABOLIC PANEL
ANION GAP: 6 (ref 5–15)
BUN: 14 mg/dL (ref 6–20)
CO2: 23 mmol/L (ref 22–32)
CREATININE: 0.64 mg/dL (ref 0.44–1.00)
Calcium: 7.8 mg/dL — ABNORMAL LOW (ref 8.9–10.3)
Chloride: 107 mmol/L (ref 101–111)
GFR calc Af Amer: >60 mL/min (ref >60)
GFR calc non Af Amer: >60 mL/min (ref >60)
GLUCOSE: 89 mg/dL (ref 65–99)
Potassium: 3.4 mmol/L — ABNORMAL LOW (ref 3.5–5.1)
Sodium: 136 mmol/L (ref 135–145)

## 2015-04-16 MED ORDER — NICOTINE 10 MG IN INHA
1.0000 | RESPIRATORY_TRACT | Status: DC | PRN
  Administered 2015-04-16 – 2015-04-18 (×6): 1 via RESPIRATORY_TRACT
  Filled 2015-04-16 (×7): qty 33

## 2015-04-16 MED ORDER — VANCOMYCIN HCL IN DEXTROSE 1-5 GM/200ML-% IV SOLN
1000.0000 mg | INTRAVENOUS | Status: DC
Start: 2015-04-16 — End: 2015-04-17
  Administered 2015-04-16 – 2015-04-17 (×2): 1000 mg via INTRAVENOUS
  Filled 2015-04-16 (×3): qty 200

## 2015-04-16 MED ORDER — POTASSIUM CHLORIDE 20 MEQ/15ML (10%) PO SOLN
40.0000 meq | Freq: Once | ORAL | Status: AC
  Administered 2015-04-16: 40 meq via ORAL
  Filled 2015-04-16 (×2): qty 30

## 2015-04-16 NOTE — Progress Notes (Signed)
Patient a&o, vss. Pain controlled with medication. Groin reddened and painful. IV fluids infusing. Ambulatory up to bathroom independently. No complaints at this time. Plane to be on IV antibiotics for 2-3 days, then d/c home on PO antibiotics accordingo Dr. Gardiner Coins note.

## 2015-04-16 NOTE — Progress Notes (Signed)
Pt complains of pain in her groin due to cellulitis. Has been started on IV ABX, and medicated twice this shift with morphine. Pt also complained of nausea earlier in the shift and in the ED. Needs assist with simple tasks such as changing in to hospital gown from street clothes and going to the bathroom. May need to assess for weakness. WBC very elevated this AM, slightly febrile.

## 2015-04-16 NOTE — Progress Notes (Signed)
[place] at [place] NAME: Deanna Ramsey    MR#:  [id number]  DATE OF BIRTH:  November 06, 1966  SUBJECTIVE:  Patient here with cellulitis groin area. She has had this in February  REVIEW OF SYSTEMS:    Review of Systems  Constitutional: Positive for fever. Negative for chills, weight loss and malaise/fatigue.  Eyes: Negative for blurred vision.  Cardiovascular: Negative for chest pain and palpitations.  Gastrointestinal: Negative for nausea, vomiting and abdominal pain.  Genitourinary: Negative for dysuria.  Musculoskeletal: Negative for myalgias.  Skin:       Patient has celllulitis which is red, hot tender along perineum and vagina extending to buttocks  Neurological: Positive for headaches. Negative for tingling.  Psychiatric/Behavioral: Negative for depression.  All other systems reviewed and are negative.   Tolerating Diet:yes      DRUG ALLERGIES:   Allergies  Allergen Reactions  . Sulfa Antibiotics Nausea And Vomiting    VITALS:  Blood pressure 110/59, pulse 81, temperature 101 F (38.3 C), temperature source Oral, resp. rate 18, height 5' 5.5" (1.664 m), weight 49.244 kg (108 lb 9 oz), SpO2 97 %.  PHYSICAL EXAMINATION:   Physical Exam  Constitutional: She is oriented to person, place, and time and well-developed, well-nourished, and in no distress.  HENT:  Head: Normocephalic and atraumatic.  Eyes: Pupils are equal, round, and reactive to light.  Neck: Normal range of motion.  Cardiovascular: Normal rate, regular rhythm and normal heart sounds.   No murmur heard. Pulmonary/Chest: Breath sounds normal. No respiratory distress. She has no wheezes.  Musculoskeletal: Normal range of motion. She exhibits no edema or tenderness.  Neurological: She is alert and oriented to person, place, and time.  Skin: Skin is dry. There is erythema.  Large area of cellulitis extends along perineum, buttocks red hot tender to lower  abdomen  Psychiatric: Affect normal.      LABORATORY PANEL:   CBC  Recent Labs Lab 04/16/15 0400  WBC 27.1*  HGB 12.5  HCT 36.9  PLT 215   ------------------------------------------------------------------------------------------------------------------  Chemistries   Recent Labs Lab 04/16/15 0400  NA 136  K 3.4*  CL 107  CO2 23  GLUCOSE 89  BUN 14  CREATININE 0.64  CALCIUM 7.8*   ------------------------------------------------------------------------------------------------------------------  Cardiac Enzymes No results for input(s): TROPONINI in the last 168 hours. ------------------------------------------------------------------------------------------------------------------  RADIOLOGY:  Ct Pelvis W Contrast  04/15/2015   CLINICAL DATA:  Redness and swelling in BILATERAL inguinal and pubic regions beginning this morning, similar symptoms in September 2015, groin cellulitis, history of sepsis and smoking  EXAM: CT PELVIS WITH CONTRAST  TECHNIQUE: Multidetector CT imaging of the pelvis was performed using the standard protocol following the bolus administration of intravenous contrast. Sagittal and coronal MPR images reconstructed from axial data set.  CONTRAST:  146mL OMNIPAQUE IOHEXOL 300 MG/ML SOLN IV. Oral contrast was not administered.  COMPARISON:  09/03/2014  FINDINGS: Pelvic bowel loops unremarkable.  Tiny renal calculus and small cyst at inferior pole RIGHT kidney.  Gallbladder appears distended.  Scattered atherosclerotic calcifications.  Unremarkable bladder and distal ureters.  No intrapelvic mass or adenopathy.  Normal size inguinal lymph nodes bilaterally.  Minimal scattered stranding of subcutaneous fat at the inguinal regions bilaterally, nonspecific.  Again demonstrated intramuscular lipoma 5.1 x 3.1 x 7.4 cm of the proximal RIGHT sartorius muscle.  No acute osseous findings.  IMPRESSION: Tiny RIGHT renal calculus and small cyst at inferior pole RIGHT  kidney. New line no acute  intrapelvic abnormalities.  Normal size inguinal lymph nodes bilaterally with minimal stranding of subcutaneous fat; no evidence of abscess or enlarged nodes.  Intramuscular lipoma RIGHT sartorius muscle.   Electronically Signed   By: Lavonia Dana M.D.   On: 04/15/2015 21:43     ASSESSMENT AND PLAN:   49 year old Caucasian female without significant past medical history presenting with a one-day duration of skin redness and pain.  1. Cellulitis, suprapubic: Received vancomycin as well as Zosyn by emergency department staff We have continued these antibiotics for now. Continue pain control and supportive care. I expect she will need IV Antibiotics for 2-3 days then many be discharged on oral medications. CT did not show abscess. However, if the cellulitis does not improve and patient continues to have pain and marked area of erythema, which should consult surgery.  Blood cultures are negative to date.  2. Hypokalemia: I will replete and repeat BMP in a.m.  3. Leukocytosis: It is noted the patient's white blood cell count is 27.1 increased from admission WBC of 16.6. She is on broad-spectrum antibiotics. We will follow up on blood cultures repeat a CBC in the a.m. I will continue with current antibiotics.    . Management plans discussed with the patient and she is in agreement.  CODE STATUS: Full 35  TOTAL TIME TAKING CARE OF THIS PATIENT: 35 minutes.   POSSIBLE D/C IN 3  DAYS, DEPENDING ON CLINICAL CONDITION.   Linnet Bottari M.D on 04/16/2015 at 11:06 AM  Between 7am to 6pm - Pager - [phone] After 6pm go to [email or web address] - password [email or web address] [facility]  [place] [facility]  Office  [phone]  CC: Primary care physician; No primary care provider on file.

## 2015-04-16 NOTE — Progress Notes (Signed)
ANTIBIOTIC CONSULT NOTE - INITIAL  Pharmacy Consult for vancomycin dosing  Indication: cellulitis  Allergies  Allergen Reactions  . Sulfa Antibiotics Nausea And Vomiting    Patient Measurements: Height: 5' 5.5" (166.4 cm) Weight: 108 lb 9 oz (49.244 kg) IBW/kg (Calculated) : 58.15 Adjusted Body Weight: 49.2  Vital Signs: Temp: 102.2 F (39 C) (05/07 2237) Temp Source: Oral (05/07 2237) BP: 135/66 mmHg (05/07 2237) Pulse Rate: 89 (05/07 2237) Intake/Output from previous day:   Intake/Output from this shift:    Labs:  Recent Labs  04/15/15 1640  WBC 16.6*  HGB 15.0  PLT 322  CREATININE 0.77   Estimated Creatinine Clearance: 66.8 mL/min (by C-G formula based on Cr of 0.77). No results for input(s): VANCOTROUGH, VANCOPEAK, VANCORANDOM, GENTTROUGH, GENTPEAK, GENTRANDOM, TOBRATROUGH, TOBRAPEAK, TOBRARND, AMIKACINPEAK, AMIKACINTROU, AMIKACIN in the last 72 hours.   Microbiology: No results found for this or any previous visit (from the past 720 hour(s)).  Medical History: Past Medical History  Diagnosis Date  . Depression   . Cellulitis   . Arthritis     Medications:   Assessment:   Goal of Therapy:  Vancomycin trough level 15-20 mcg/ml  Plan:  Blood cx pending TBW 49.2kg IBW 57kg  DW 49.2kg  Vd 34L kei 0.06 hr-1  t1/2 12 hours 1 gram given in ED. Stacked dose of 1 gram ordered for 10:00 and then q 18 hours. Level ordered 15:30, 5/10 before 5th dose. Goal trough 15-20 for r/o bacteremia.  Cashe Gatt S 04/16/2015,1:06 AM

## 2015-04-17 LAB — BASIC METABOLIC PANEL
Anion gap: 4 — ABNORMAL LOW (ref 5–15)
BUN: 12 mg/dL (ref 6–20)
CO2: 26 mmol/L (ref 22–32)
Calcium: 7.8 mg/dL — ABNORMAL LOW (ref 8.9–10.3)
Chloride: 108 mmol/L (ref 101–111)
Creatinine, Ser: 0.62 mg/dL (ref 0.44–1.00)
GFR calc Af Amer: >60 mL/min (ref >60)
GFR calc non Af Amer: >60 mL/min (ref >60)
GLUCOSE: 96 mg/dL (ref 65–99)
POTASSIUM: 4 mmol/L (ref 3.5–5.1)
SODIUM: 138 mmol/L (ref 135–145)

## 2015-04-17 LAB — CBC
HEMATOCRIT: 35.7 % (ref 35.0–47.0)
Hemoglobin: 11.6 g/dL — ABNORMAL LOW (ref 12.0–16.0)
MCH: 31.3 pg (ref 26.0–34.0)
MCHC: 32.6 g/dL (ref 32.0–36.0)
MCV: 95.9 fL (ref 80.0–100.0)
Platelets: 172 10*3/uL (ref 150–440)
RBC: 3.72 MIL/uL — ABNORMAL LOW (ref 3.80–5.20)
RDW: 12.8 % (ref 11.5–14.5)
WBC: 13.8 10*3/uL — AB (ref 3.6–11.0)

## 2015-04-17 MED ORDER — PNEUMOCOCCAL VAC POLYVALENT 25 MCG/0.5ML IJ INJ
0.5000 mL | INJECTION | INTRAMUSCULAR | Status: AC
  Administered 2015-04-18: 0.5 mL via INTRAMUSCULAR
  Filled 2015-04-17: qty 0.5

## 2015-04-17 MED ORDER — CLINDAMYCIN PHOSPHATE 600 MG/50ML IV SOLN
600.0000 mg | Freq: Three times a day (TID) | INTRAVENOUS | Status: DC
  Administered 2015-04-17 – 2015-04-18 (×3): 600 mg via INTRAVENOUS
  Filled 2015-04-17 (×9): qty 50

## 2015-04-17 NOTE — Plan of Care (Signed)
Problem: Phase I Progression Outcomes Goal: Wound assessment- dressing change as appropriate Outcome: Progressing New IV ABX started - Cleocin IV

## 2015-04-17 NOTE — Progress Notes (Signed)
[place] at [place] NAME: Deanna Ramsey    MR#:  [id number]  DATE OF BIRTH:  [birth year]  SUBJECTIVE:  Continues with redness in the perineal /vaginal area. Low grade fever this am.  REVIEW OF SYSTEMS:   Review of Systems  Constitutional: Positive for fever, chills and malaise/fatigue. Negative for weight loss.  HENT: Negative for ear pain and hearing loss.   Respiratory: Negative for cough, hemoptysis and sputum production.   Cardiovascular: Positive for chest pain. Negative for palpitations and orthopnea.  Gastrointestinal: Negative for heartburn, nausea, vomiting and abdominal pain.  Genitourinary: Negative for dysuria, urgency and frequency.  Musculoskeletal: Negative for myalgias, back pain and neck pain.  Skin: Positive for itching and rash.  Neurological: Negative for sensory change, speech change, focal weakness and headaches.  Endo/Heme/Allergies: Does not bruise/bleed easily.  Psychiatric/Behavioral: Positive for depression. The patient is not nervous/anxious.    Nutrition: PO diet Tolerating PT: does not need Tolerating diet: yes  DRUG ALLERGIES:   Allergies  Allergen Reactions  . Sulfa Antibiotics Nausea And Vomiting    VITALS:  Blood pressure 122/80, pulse 76, temperature 100.7 F (38.2 C), temperature source Oral, resp. rate 18, height 5' 5.5" (1.664 m), weight 49.244 kg (108 lb 9 oz), SpO2 100 %.  PHYSICAL EXAMINATION:  GENERAL:  49 y.o.-year-old patient lying in the bed with no acute distress.  EYES: Pupils equal, round, reactive to light and accommodation. No scleral icterus. Extraocular muscles intact.  HEENT: Head atraumatic, normocephalic. Oropharynx and nasopharynx clear.  NECK:  Supple, no jugular venous distention. No thyroid enlargement, no tenderness.  LUNGS: Normal breath sounds bilaterally, no wheezing, rales,rhonchi or crepitation. No use of accessory muscles of respiration.  CARDIOVASCULAR: S1, S2  normal. No murmurs, rubs, or gallops.  ABDOMEN: Soft, nontender, nondistended. Bowel sounds present. No organomegaly or mass.  EXTREMITIES: No pedal edema, cyanosis, or clubbing.  NEUROLOGIC: Cranial nerves II through XII are intact. Muscle strength 5/5 in all extremities. Sensation intact. Gait not checked.  PSYCHIATRIC: The patient is alert and oriented x 3.  SKIN: cellulitis with eyrthema around the perineal are extending to the suprapubic area. Tender LN's bilateral groin +  LABORATORY PANEL:   CBC  Recent Labs Lab 04/16/15 0400 04/17/15 0339  WBC 27.1* 13.8*  HGB 12.5 11.6*  HCT 36.9 35.7  PLT 215 172   ------------------------------------------------------------------------------------------------------------------  Chemistries   Recent Labs Lab 04/16/15 0400 04/17/15 0339  NA 136 138  K 3.4* 4.0  CL 107 108  CO2 23 26  GLUCOSE 89 96  BUN 14 12  CREATININE 0.64 0.62  CALCIUM 7.8* 7.8*    RADIOLOGY:  Ct Pelvis W Contrast  04/15/2015   CLINICAL DATA:  Redness and swelling in BILATERAL inguinal and pubic regions beginning this morning, similar symptoms in September 2015, groin cellulitis, history of sepsis and smoking  EXAM: CT PELVIS WITH CONTRAST  TECHNIQUE: Multidetector CT imaging of the pelvis was performed using the standard protocol following the bolus administration of intravenous contrast. Sagittal and coronal MPR images reconstructed from axial data set.  CONTRAST:  165mL OMNIPAQUE IOHEXOL 300 MG/ML SOLN IV. Oral contrast was not administered.  COMPARISON:  09/03/2014  FINDINGS: Pelvic bowel loops unremarkable.  Tiny renal calculus and small cyst at inferior pole RIGHT kidney.  Gallbladder appears distended.  Scattered atherosclerotic calcifications.  Unremarkable bladder and distal ureters.  No intrapelvic mass or adenopathy.  Normal size inguinal lymph nodes bilaterally.  Minimal scattered stranding of subcutaneous  fat at the inguinal regions bilaterally,  nonspecific.  Again demonstrated intramuscular lipoma 5.1 x 3.1 x 7.4 cm of the proximal RIGHT sartorius muscle.  No acute osseous findings.  IMPRESSION: Tiny RIGHT renal calculus and small cyst at inferior pole RIGHT kidney. New line no acute intrapelvic abnormalities.  Normal size inguinal lymph nodes bilaterally with minimal stranding of subcutaneous fat; no evidence of abscess or enlarged nodes.  Intramuscular lipoma RIGHT sartorius muscle.   Electronically Signed   By: Lavonia Dana M.D.   On: 04/15/2015 21:43     ASSESSMENT AND PLAN:  49 year old Caucasian female without significant past medical history presenting with a one-day duration of skin redness and pain.  1.Streptococcus pyogenes Sepsis due to Cellulitis,Pernial area was on IV Vancomycin and Zosyn -->change to IV clindamycin -repeat BC today to ensure clearing of infection -CT did not show abscess.  -BC from admission 2/2 Streto pyogenes -d/w to avoid resusing razor and maintain appropriate hygiene.  2. Hypokalemia: replete  3. Leukocytosis: WBC 16.1---.27K-->13.9 -cont to monitor    All the records are reviewed and case discussed with Care Management/Social Workerr. Management plans discussed with the patient, family and they are in agreement.  CODE STATUS: full  TOTAL TIME TAKING CARE OF THIS PATIENT: 49 minutes.   POSSIBLE D/C IN 1-2DAYS, DEPENDING ON CLINICAL CONDITION.   Deanna Ramsey M.D on 04/17/2015 at 10:26 AM  Between 7am to 6pm - Pager - [phone]  After 6pm go to [email or web address] - password [email or web address] [facility]  [place] [facility]  Office  [phone]  CC: Primary care physician; No primary care provider on file.

## 2015-04-17 NOTE — Care Management Note (Signed)
Case Management Note  Patient Details  Name: Deanna Ramsey MRN: [id number] Date of Birth: 24-May-1966  Subjective/Objective:                   Patient resting in bed comfortably; eager to get back to work. Had questions related to hospital bill. She goes to [facility] and states that "they will assist her with medications at discharge". Patient is self-pay- no health insurance.  Action/Plan: Patient denies RNCM needs.  Expected Discharge Date:  04/17/15               Expected Discharge Plan:     In-House Referral:     Discharge planning Services  CM Consult, [facility]  Post Acute Care Choice:    Choice offered to:     DME Arranged:    DME Agency:     HH Arranged:  NA HH Agency:     Status of Service:     Medicare Important Message Given:    Date Medicare IM Given:    Medicare IM give by:    Date Additional Medicare IM Given:    Additional Medicare Important Message give by:     If discussed at [place] of Stay Meetings, dates discussed:    Additional Comments:  Marshell Garfinkel, RN 04/17/2015, 10:32 AM

## 2015-04-17 NOTE — Progress Notes (Signed)
Chaplain meets with patient provides prayer, encouragement and emotional support.  Loralyn Freshwater D. Alroy Dust Monday 04-17-2015   04/17/15 1300  Clinical Encounter Type  Visited With Patient  Visit Type Initial  Referral From Chaplain  Consult/Referral To Chaplain  Spiritual Encounters  Spiritual Needs Prayer;Emotional;Other (Comment) (Patient plans to return to her local church when well)  Stress Factors  Patient Stress Factors Health changes (no one to care for pet in the home when she is away)  Family Stress Factors Lack of caregivers

## 2015-04-17 NOTE — Progress Notes (Addendum)
Dr.Shah paged, waiting for callback-to inform of temp- 100.7, MD callback, updated with patients temp of 100.7, order received to adm Tylenol.

## 2015-04-17 NOTE — Progress Notes (Signed)
Resting in bed, ambulates to the bedroom on own ,temp of 100.7 this am,reassessed temp- 98.8,  pain meds given as needed- with relief, tolerating heart diet well, nausea med given x1 with relief. New IV abx started today- Cleocin, discontinued vancomycin.

## 2015-04-18 MED ORDER — HYDROCODONE-ACETAMINOPHEN 5-325 MG PO TABS: 1.0000 | ORAL_TABLET | Freq: Four times a day (QID) | ORAL | Status: DC | PRN

## 2015-04-18 MED ORDER — DIPHENHYDRAMINE HCL 25 MG PO CAPS
ORAL_CAPSULE | ORAL | Status: AC
  Administered 2015-04-18: 25 mg
  Filled 2015-04-18: qty 1

## 2015-04-18 MED ORDER — DIPHENHYDRAMINE HCL 25 MG PO CAPS: 25.0000 mg | ORAL_CAPSULE | Freq: Four times a day (QID) | ORAL | Status: DC | PRN

## 2015-04-18 MED ORDER — DIPHENHYDRAMINE HCL 25 MG PO CAPS
25.0000 mg | ORAL_CAPSULE | Freq: Four times a day (QID) | ORAL | Status: DC | PRN
Start: 2015-04-18 — End: 2018-02-17

## 2015-04-18 MED ORDER — CLINDAMYCIN HCL 300 MG PO CAPS: 600.0000 mg | ORAL_CAPSULE | Freq: Three times a day (TID) | ORAL | Status: DC

## 2015-04-18 NOTE — Progress Notes (Signed)
[place]    Deanna Ramsey was admitted to the Hospital on 04/15/2015 and Discharged  04/18/2015 and should be excused from work/school .  for 5 days starting 04/15/2015 , may return to work/school without any restrictions on Monday may 16th  Call [name] MD, [facility]  [phone] with questions.  [name] M.D on 04/18/2015,at 10:46 AM

## 2015-04-18 NOTE — Progress Notes (Signed)
Pt seen by Dr Posey Pronto for discharge iv d/c x2 sites, discharge instructions given  Friend at bedside

## 2015-04-18 NOTE — Discharge Summary (Signed)
[place] at [place] NAME: Deanna Ramsey    MR#:  [id number]  DATE OF BIRTH:  1966/07/03  DATE OF ADMISSION:  04/15/2015 ADMITTING PHYSICIAN: Lytle Butte, MD  DATE OF DISCHARGE: 04/18/2015  PRIMARY CARE PHYSICIAN: [facility]   ADMISSION DIAGNOSIS:  Cellulitis of perineum [T24.580]  DISCHARGE DIAGNOSIS:  Streptococcus pyogenes Sepsis Cellulitis-Perineum  SECONDARY DIAGNOSIS:   Past Medical History  Diagnosis Date  . Depression   . Cellulitis   . Arthritis     HOSPITAL COURSE:  49 year old Caucasian female without significant past medical history presenting with a one-day duration of skin redness and pain.  1.Streptococcus pyogenes Sepsis due to Cellulitis,Pernial area was on IV Vancomycin and Zosyn -->changed to IV clindamycin -repeat BC neg per micro lab -CT did not show abscess.  -BC from admission 2/2 Streto pyogenes -d/w to avoid resusing razor and maintain appropriate hygiene.  2. Hypokalemia: replete  3. Leukocytosis: WBC 16.1---.27K-->13.9 Pt remains afebrile  Requesting to go home   DISCHARGE CONDITIONS:   fair  CONSULTS OBTAINED:   NoNE  DRUG ALLERGIES:   Allergies  Allergen Reactions  . Sulfa Antibiotics Nausea And Vomiting    DISCHARGE MEDICATIONS:   Current Discharge Medication List    START taking these medications   Details  clindamycin (CLEOCIN) 300 MG capsule Take 2 capsules (600 mg total) by mouth 3 (three) times daily. Qty: 18 capsule, Refills: 0    diphenhydrAMINE (BENADRYL) 25 mg capsule Take 1 capsule (25 mg total) by mouth every 6 (six) hours as needed for itching. Qty: 30 capsule, Refills: 0    HYDROcodone-acetaminophen (NORCO/VICODIN) 5-325 MG per tablet Take 1 tablet by mouth every 6 (six) hours as needed for moderate pain. Qty: 30 tablet, Refills: 0         DISCHARGE INSTRUCTIONS:   Regular Diet Stop smoking  If you experience worsening of your  admission symptoms, develop shortness of breath, life threatening emergency, suicidal or homicidal thoughts you must seek medical attention immediately by calling 911 or calling your MD immediately  if symptoms less severe.  You Must read complete instructions/literature along with all the possible adverse reactions/side effects for all the Medicines you take and that have been prescribed to you. Take any new Medicines after you have completely understood and accept all the possible adverse reactions/side effects.   Please note  You were cared for by a hospitalist during your hospital stay. If you have any questions about your discharge medications or the care you received while you were in the hospital after you are discharged, you can call the unit and asked to speak with the hospitalist on call if the hospitalist that took care of you is not available. Once you are discharged, your primary care physician will handle any further medical issues. Please note that NO REFILLS for any discharge medications will be authorized once you are discharged, as it is imperative that you return to your primary care physician (or establish a relationship with a primary care physician if you do not have one) for your aftercare needs so that they can reassess your need for medications and monitor your lab values.    Today   SUBJECTIVE  Doing well. Rash improving. No fever. Some local pain and itching    VITAL SIGNS:  Blood pressure 137/74, pulse 63, temperature 97.9 F (36.6 C), temperature source Oral, resp. rate 18, height 5' 5.5" (1.664 m), weight 49.244 kg (108 lb 9 oz),  SpO2 100 %.  I/O:   Intake/Output Summary (Last 24 hours) at 04/18/15 1035 Last data filed at 04/18/15 0900  Gross per 24 hour  Intake   2850 ml  Output   1100 ml  Net   1750 ml    PHYSICAL EXAMINATION:  GENERAL:  49 y.o.-year-old patient lying in the bed with no acute distress.  EYES: Pupils equal, round, reactive to light and  accommodation. No scleral icterus. Extraocular muscles intact.  HEENT: Head atraumatic, normocephalic. Oropharynx and nasopharynx clear.  NECK:  Supple, no jugular venous distention. No thyroid enlargement, no tenderness.  LUNGS: Normal breath sounds bilaterally, no wheezing, rales,rhonchi or crepitation. No use of accessory muscles of respiration.  CARDIOVASCULAR: S1, S2 normal. No murmurs, rubs, or gallops.  ABDOMEN: Soft, non-tender, non-distended. Bowel sounds present. No organomegaly or mass.  EXTREMITIES: No pedal edema, cyanosis, or clubbing.  NEUROLOGIC: Cranial nerves II through XII are intact. Muscle strength 5/5 in all extremities. Sensation intact. Gait not checked.  PSYCHIATRIC: The patient is alert and oriented x 3.  SKIN: erythema improving  DATA REVIEW:   CBC  Recent Labs Lab 04/17/15 0339  WBC 13.8*  HGB 11.6*  HCT 35.7  PLT 172    Chemistries   Recent Labs Lab 04/17/15 0339  NA 138  K 4.0  CL 108  CO2 26  GLUCOSE 96  BUN 12  CREATININE 0.62  CALCIUM 7.8*    Cardiac Enzymes No results for input(s): TROPONINI in the last 168 hours.  Microbiology Results  Results for orders placed or performed during the hospital encounter of 04/15/15  Blood culture (routine x 2)     Status: None (Preliminary result)   Collection Time: 04/15/15  6:20 PM  Result Value Ref Range Status   Specimen Description BLOOD  Final   Special Requests NONE  Final   Culture   Final    STREPTOCOCCUS PYOGENES AEROBIC BOTTLE ONLY CRITICAL RESULT CALLED TO, READ BACK BY AND VERIFIED WITH: ANNA [place] ON 04/16/15 AT 0945 BY JEF SUSCEPTIBILITIES TO FOLLOW There is no known Penicillin Resistant Beta Streptococcus in the [place] For patients that are Penicillin-allergic, Erythromycin is 85-94% susceptible, and Clindamycin is 80% susceptible.  Contact Microbiology within 7 days if  sensitivity testing is required.      Report Status PENDING  Incomplete  Blood culture (routine x 2)      Status: None (Preliminary result)   Collection Time: 04/15/15  6:35 PM  Result Value Ref Range Status   Specimen Description BLOOD  Final   Special Requests NONE  Final   Culture   Final    STREPTOCOCCUS PYOGENES ANAEROBIC BOTTLE ONLY There is no known Penicillin Resistant Beta Streptococcus in the [place] For patients that are Penicillin-allergic, Erythromycin is 85-94% susceptible, and Clindamycin is 80% susceptible.  Contact Microbiology within 7 days if sensitivity testing is  required.   CRITICAL RESULT CALLED TO, READ BACK BY AND VERIFIED WITH: ANNA CAMPBELL ON 04/16/15 AT 0945 BY JEF REFER TO OTHER SET FOR ORGANISM SENSITIVITIES    Report Status PENDING  Incomplete    RADIOLOGY:  No results found.  EKG:  No orders found for this or any previous visit.    Management plans discussed with the patient, family and they are in agreement.  CODE STATUS:     Code Status Orders        Start     Ordered   04/15/15 2035  Full code   Continuous     04/15/15  2035      TOTAL TIME TAKING CARE OF THIS PATIENT: 35 min.    [place] Evitt Ramsey on 04/18/2015 at 10:35 AM  Between 7am to 6pm - Pager - [phone] After 6pm go to [email or web address] - password [email or web address] [facility]  [place] [facility]  Office  [phone]  CC: Primary care physician; No primary care provider on file.

## 2015-04-20 LAB — CULTURE, BLOOD (ROUTINE X 2)

## 2015-04-22 LAB — CULTURE, BLOOD (SINGLE): Culture: NO GROWTH

## 2015-09-04 ENCOUNTER — Ambulatory Visit: Payer: Self-pay

## 2016-02-28 ENCOUNTER — Ambulatory Visit
Admission: RE | Admit: 2016-02-28 | Discharge: 2016-02-28 | Disposition: A | Discharge status: Home / Self Care | Payer: Self-pay | Source: Ambulatory Visit | Attending: Oncology | Admitting: Oncology

## 2016-02-28 ENCOUNTER — Ambulatory Visit: Payer: Self-pay | Attending: Oncology

## 2016-02-28 VITALS — BP 159/99 | HR 72 | Temp 98.2°F | Resp 12 | Ht 65.75 in | Wt 105.5 lb

## 2016-02-28 DIAGNOSIS — Z0000 Encounter for general adult medical examination without abnormal findings: Secondary | ICD-10-CM

## 2016-02-28 NOTE — Progress Notes (Signed)
Subjective:     Patient ID: Deanna Ramsey, female   DOB: 1966/04/15, 50 y.o.   MRN: [id number]  HPI   Review of Systems     Objective:   Physical Exam  Pulmonary/Chest: Right breast exhibits no inverted nipple, no mass, no nipple discharge, no skin change and no tenderness. Left breast exhibits no inverted nipple, no mass, no nipple discharge, no skin change and no tenderness. Breasts are symmetrical.  Genitourinary: No labial fusion. There is no rash, tenderness, lesion or injury on the right labia. There is no rash, tenderness, lesion or injury on the left labia. Uterus is not deviated, not enlarged, not fixed and not tender. Cervix exhibits no motion tenderness, no discharge and no friability. Right adnexum displays no mass, no tenderness and no fullness. Left adnexum displays no mass, no tenderness and no fullness. No erythema, tenderness or bleeding in the vagina. No foreign body around the vagina. No signs of injury around the vagina. No vaginal discharge found.       Assessment:     50 year old patient presents for BCCCP clinic visitPatient screened, and meets BCCCP eligibility.  Patient does not have insurance, Medicare or Medicaid.  Handout given on Affordable Care Act.Instructed patient on breast self-exam using teach back methodCBE unremarkable .  Pelvic exam normal.  Patient reports vaginal dryness, and discomfort. Reinforced information given by Tanya Nones RN on use and application of vaginal moisturizers, and lubricants.    Plan:     Sent for bilateral screening mammogram.

## 2016-03-05 NOTE — Progress Notes (Signed)
Letter mailed from [facility] to notify of normal mammogram results.  Patient to return in one year for annual screening.  Copy to [facility]. 

## 2017-03-05 ENCOUNTER — Ambulatory Visit: Payer: Self-pay

## 2017-03-12 ENCOUNTER — Encounter (INDEPENDENT_AMBULATORY_CARE_PROVIDER_SITE_OTHER): Payer: Self-pay

## 2017-03-12 ENCOUNTER — Ambulatory Visit: Payer: Self-pay | Attending: Oncology

## 2017-03-12 ENCOUNTER — Ambulatory Visit
Admission: RE | Admit: 2017-03-12 | Discharge: 2017-03-12 | Disposition: A | Discharge status: Home / Self Care | Payer: Self-pay | Source: Ambulatory Visit | Attending: Oncology | Admitting: Oncology

## 2017-03-12 VITALS — BP 146/79 | HR 70 | Temp 94.0°F | Resp 18 | Ht 65.0 in | Wt 104.0 lb

## 2017-03-12 DIAGNOSIS — Z0000 Encounter for general adult medical examination without abnormal findings: Secondary | ICD-10-CM

## 2017-03-12 NOTE — Progress Notes (Signed)
Subjective:     Patient ID: Deanna Ramsey, female   DOB: 1966-02-13, 51 y.o.   MRN: [id number]  HPI   Review of Systems     Objective:   Physical Exam  Pulmonary/Chest: Right breast exhibits no inverted nipple, no mass, no nipple discharge, no skin change and no tenderness. Left breast exhibits no inverted nipple, no mass, no nipple discharge, no skin change and no tenderness. Breasts are symmetrical.       Assessment:     51 year old patient presents for [facility] clinic visit.  Patient screened, and meets BCCCP eligibility.  Patient does not have insurance, Medicare or Medicaid.  Handout given on Affordable Care Act. Instructed patient on breast self-exam using teach back method.  CBE unremarkable.  No mass or lump palpated.  Refuses pelvic exam.  Patient had a negative/negative pap in 2014.  She understands she will be due for her pap smear in 2019.    Plan:     Sent for bilateral screening mammogram.

## 2017-03-18 NOTE — Progress Notes (Unsigned)
Letter mailed from [facility] to notify of normal mammogram results.  Patient to return in one year for annual screening.  Copy to [facility]. 

## 2018-02-17 ENCOUNTER — Emergency Department: Payer: Self-pay

## 2018-02-17 ENCOUNTER — Other Ambulatory Visit: Payer: Self-pay

## 2018-02-17 ENCOUNTER — Inpatient Hospital Stay
Admission: EM | Admit: 2018-02-17 | Discharge: 2018-02-18 | DRG: 871 | Disposition: A | Discharge status: Home / Self Care | Payer: Self-pay | Attending: Internal Medicine | Admitting: Internal Medicine

## 2018-02-17 DIAGNOSIS — F329 Major depressive disorder, single episode, unspecified: Secondary | ICD-10-CM | POA: Diagnosis present

## 2018-02-17 DIAGNOSIS — Z825 Family history of asthma and other chronic lower respiratory diseases: Secondary | ICD-10-CM

## 2018-02-17 DIAGNOSIS — J189 Pneumonia, unspecified organism: Secondary | ICD-10-CM | POA: Diagnosis present

## 2018-02-17 DIAGNOSIS — R079 Chest pain, unspecified: Secondary | ICD-10-CM

## 2018-02-17 DIAGNOSIS — A419 Sepsis, unspecified organism: Principal | ICD-10-CM | POA: Diagnosis present

## 2018-02-17 DIAGNOSIS — R000 Tachycardia, unspecified: Secondary | ICD-10-CM

## 2018-02-17 DIAGNOSIS — Z881 Allergy status to other antibiotic agents status: Secondary | ICD-10-CM

## 2018-02-17 DIAGNOSIS — Z716 Tobacco abuse counseling: Secondary | ICD-10-CM

## 2018-02-17 DIAGNOSIS — I10 Essential (primary) hypertension: Secondary | ICD-10-CM | POA: Diagnosis present

## 2018-02-17 DIAGNOSIS — I700 Atherosclerosis of aorta: Secondary | ICD-10-CM | POA: Diagnosis present

## 2018-02-17 DIAGNOSIS — F17210 Nicotine dependence, cigarettes, uncomplicated: Secondary | ICD-10-CM | POA: Diagnosis present

## 2018-02-17 DIAGNOSIS — Z79899 Other long term (current) drug therapy: Secondary | ICD-10-CM

## 2018-02-17 DIAGNOSIS — Z8249 Family history of ischemic heart disease and other diseases of the circulatory system: Secondary | ICD-10-CM

## 2018-02-17 DIAGNOSIS — J441 Chronic obstructive pulmonary disease with (acute) exacerbation: Secondary | ICD-10-CM | POA: Diagnosis present

## 2018-02-17 DIAGNOSIS — J440 Chronic obstructive pulmonary disease with acute lower respiratory infection: Secondary | ICD-10-CM | POA: Diagnosis present

## 2018-02-17 HISTORY — DX: Essential (primary) hypertension: I10

## 2018-02-17 LAB — BASIC METABOLIC PANEL
Anion gap: 9 (ref 5–15)
BUN: 15 mg/dL (ref 6–20)
CHLORIDE: 105 mmol/L (ref 101–111)
CO2: 23 mmol/L (ref 22–32)
CREATININE: 0.7 mg/dL (ref 0.44–1.00)
Calcium: 9 mg/dL (ref 8.9–10.3)
GFR calc non Af Amer: >60 mL/min (ref >60)
Glucose, Bld: 100 mg/dL — ABNORMAL HIGH (ref 65–99)
POTASSIUM: 3.9 mmol/L (ref 3.5–5.1)
Sodium: 137 mmol/L (ref 135–145)

## 2018-02-17 LAB — CBC
HEMATOCRIT: 41.6 % (ref 35.0–47.0)
Hemoglobin: 13.7 g/dL (ref 12.0–16.0)
MCH: 31.4 pg (ref 26.0–34.0)
MCHC: 33 g/dL (ref 32.0–36.0)
MCV: 95 fL (ref 80.0–100.0)
PLATELETS: 398 10*3/uL (ref 150–440)
RBC: 4.38 MIL/uL (ref 3.80–5.20)
RDW: 13.6 % (ref 11.5–14.5)
WBC: 12.1 10*3/uL — ABNORMAL HIGH (ref 3.6–11.0)

## 2018-02-17 LAB — POCT PREGNANCY, URINE: Preg Test, Ur: NEGATIVE

## 2018-02-17 LAB — URINALYSIS, COMPLETE (UACMP) WITH MICROSCOPIC
Bacteria, UA: NONE SEEN
Bilirubin Urine: NEGATIVE
GLUCOSE, UA: NEGATIVE mg/dL
Ketones, ur: NEGATIVE mg/dL
Nitrite: POSITIVE — AB
Protein, ur: NEGATIVE mg/dL
SPECIFIC GRAVITY, URINE: 1.012 (ref 1.005–1.030)
pH: 6 (ref 5.0–8.0)

## 2018-02-17 LAB — INFLUENZA PANEL BY PCR (TYPE A & B)
INFLBPCR: NEGATIVE
Influenza A By PCR: NEGATIVE

## 2018-02-17 LAB — TROPONIN I: Troponin I: <0.03 ng/mL (ref <0.03)

## 2018-02-17 LAB — PROCALCITONIN: Procalcitonin: <0.1 ng/mL

## 2018-02-17 MED ORDER — LEVOFLOXACIN IN D5W 750 MG/150ML IV SOLN
750.0000 mg | Freq: Every day | INTRAVENOUS | Status: DC
  Administered 2018-02-18: 750 mg via INTRAVENOUS
  Filled 2018-02-17: qty 150

## 2018-02-17 MED ORDER — NICOTINE 21 MG/24HR TD PT24
21.0000 mg | MEDICATED_PATCH | Freq: Every day | TRANSDERMAL | Status: DC
  Administered 2018-02-17 – 2018-02-18 (×2): 21 mg via TRANSDERMAL
  Filled 2018-02-17 (×2): qty 1

## 2018-02-17 MED ORDER — SODIUM CHLORIDE 0.9 % IV SOLN
Freq: Once | INTRAVENOUS | Status: AC
  Administered 2018-02-17: 10:00:00 via INTRAVENOUS

## 2018-02-17 MED ORDER — DILTIAZEM HCL 25 MG/5ML IV SOLN
10.0000 mg | Freq: Once | INTRAVENOUS | Status: DC
  Filled 2018-02-17: qty 5

## 2018-02-17 MED ORDER — LISINOPRIL 20 MG PO TABS
20.0000 mg | ORAL_TABLET | Freq: Every day | ORAL | Status: DC
  Administered 2018-02-18: 20 mg via ORAL
  Filled 2018-02-17: qty 1

## 2018-02-17 MED ORDER — IOPAMIDOL (ISOVUE-370) INJECTION 76%
60.0000 mL | Freq: Once | INTRAVENOUS | Status: AC | PRN
  Administered 2018-02-17: 60 mL via INTRAVENOUS

## 2018-02-17 MED ORDER — HYDROCODONE-ACETAMINOPHEN 5-325 MG PO TABS
1.0000 | ORAL_TABLET | ORAL | Status: DC | PRN
  Administered 2018-02-17 – 2018-02-18 (×2): 1 via ORAL
  Filled 2018-02-17 (×2): qty 1

## 2018-02-17 MED ORDER — ONDANSETRON HCL 4 MG/2ML IJ SOLN: 4.0000 mg | Freq: Four times a day (QID) | INTRAMUSCULAR | Status: DC | PRN

## 2018-02-17 MED ORDER — SODIUM CHLORIDE 0.9 % IV SOLN
INTRAVENOUS | Status: DC
  Administered 2018-02-17 – 2018-02-18 (×3): via INTRAVENOUS

## 2018-02-17 MED ORDER — METHYLPREDNISOLONE SODIUM SUCC 40 MG IJ SOLR
40.0000 mg | Freq: Every day | INTRAMUSCULAR | Status: DC
  Administered 2018-02-18: 40 mg via INTRAVENOUS
  Filled 2018-02-17: qty 1

## 2018-02-17 MED ORDER — LORAZEPAM 2 MG/ML IJ SOLN
1.0000 mg | Freq: Once | INTRAMUSCULAR | Status: AC
  Administered 2018-02-17: 1 mg via INTRAVENOUS
  Filled 2018-02-17: qty 1

## 2018-02-17 MED ORDER — GUAIFENESIN ER 600 MG PO TB12
600.0000 mg | ORAL_TABLET | Freq: Two times a day (BID) | ORAL | Status: DC
  Administered 2018-02-17 – 2018-02-18 (×2): 600 mg via ORAL
  Filled 2018-02-17 (×2): qty 1

## 2018-02-17 MED ORDER — IPRATROPIUM-ALBUTEROL 0.5-2.5 (3) MG/3ML IN SOLN
3.0000 mL | Freq: Four times a day (QID) | RESPIRATORY_TRACT | Status: DC
  Administered 2018-02-17 – 2018-02-18 (×4): 3 mL via RESPIRATORY_TRACT
  Filled 2018-02-17 (×4): qty 3

## 2018-02-17 MED ORDER — LEVOFLOXACIN IN D5W 750 MG/150ML IV SOLN
750.0000 mg | Freq: Once | INTRAVENOUS | Status: AC
  Administered 2018-02-17: 750 mg via INTRAVENOUS
  Filled 2018-02-17: qty 150

## 2018-02-17 MED ORDER — KETOROLAC TROMETHAMINE 15 MG/ML IJ SOLN
15.0000 mg | Freq: Four times a day (QID) | INTRAMUSCULAR | Status: DC | PRN
  Administered 2018-02-17 (×2): 15 mg via INTRAVENOUS
  Filled 2018-02-17 (×2): qty 1

## 2018-02-17 MED ORDER — ACETAMINOPHEN 325 MG PO TABS
650.0000 mg | ORAL_TABLET | Freq: Four times a day (QID) | ORAL | Status: DC | PRN
  Administered 2018-02-18: 650 mg via ORAL
  Filled 2018-02-17: qty 2

## 2018-02-17 MED ORDER — ENOXAPARIN SODIUM 40 MG/0.4ML ~~LOC~~ SOLN
40.0000 mg | SUBCUTANEOUS | Status: DC
  Administered 2018-02-17: 40 mg via SUBCUTANEOUS
  Filled 2018-02-17: qty 0.4

## 2018-02-17 MED ORDER — ONDANSETRON HCL 4 MG PO TABS: 4.0000 mg | ORAL_TABLET | Freq: Four times a day (QID) | ORAL | Status: DC | PRN

## 2018-02-17 MED ORDER — METHYLPREDNISOLONE SODIUM SUCC 125 MG IJ SOLR
125.0000 mg | Freq: Once | INTRAMUSCULAR | Status: AC
  Administered 2018-02-17: 125 mg via INTRAVENOUS
  Filled 2018-02-17: qty 2

## 2018-02-17 MED ORDER — BUDESONIDE 0.5 MG/2ML IN SUSP
0.5000 mg | Freq: Two times a day (BID) | RESPIRATORY_TRACT | Status: DC
  Administered 2018-02-17 – 2018-02-18 (×2): 0.5 mg via RESPIRATORY_TRACT
  Filled 2018-02-17 (×2): qty 2

## 2018-02-17 MED ORDER — ACETAMINOPHEN 650 MG RE SUPP: 650.0000 mg | Freq: Four times a day (QID) | RECTAL | Status: DC | PRN

## 2018-02-17 NOTE — ED Notes (Signed)
Pt transported at this time

## 2018-02-17 NOTE — H&P (Addendum)
[place] [facility] [place] NAME: Deanna Ramsey    MR#:  [id number]  DATE OF BIRTH:  [birth year]  DATE OF ADMISSION:  02/17/2018  PRIMARY CARE PHYSICIAN: Dr. Rebeca Alert at the [facility]  REQUESTING/REFERRING PHYSICIAN: Dr. Lenise Arena  CHIEF COMPLAINT:   Chief Complaint  Patient presents with  . Chest Pain    HISTORY OF PRESENT ILLNESS:  Deanna Ramsey  is a 52 y.o. female with a known history of hypertension presents to the ER with chest pain and cough going on since Friday.  She states the chest pain is sharp in bilateral ribs.  It is worse when she takes a deep breath.  Mostly hurts when she is breathing.  She is unable to take a deep breath.  In the ER, a CT scan was negative for pulmonary embolism but did show multifocal pneumonia.  The patient also had a significant tachycardia.  Hospitalist services were contacted for further evaluation.   PAST MEDICAL HISTORY:   Past Medical History:  Diagnosis Date  . Arthritis   . Cellulitis   . Depression   . Hypertension     PAST SURGICAL HISTORY:   Past Surgical History:  Procedure Laterality Date  . arm surgery    . CLEFT LIP REPAIR    . kidney stones    . lymphnode neck      SOCIAL HISTORY:   Social History   Tobacco Use  . Smoking status: Current Every Day Smoker    Packs/day: 1.00    Types: Cigarettes  . Smokeless tobacco: Never Used  Substance Use Topics  . Alcohol use: No    FAMILY HISTORY:   Family History  Problem Relation Age of Onset  . COPD Other   . COPD Mother   . Hypertension Mother   . COPD Father     DRUG ALLERGIES:   Allergies  Allergen Reactions  . Sulfa Antibiotics Nausea And Vomiting    REVIEW OF SYSTEMS:  CONSTITUTIONAL: No fever.  Positive for chills and sweats.  Positive for weight loss.  Positive for fatigue.  EYES: No blurred or double vision. wears glasses.   EARS, NOSE, AND THROAT: No tinnitus or ear pain. No sore throat.   Positive for runny nose and hearing loss. RESPIRATORY: Positive for cough, shortness of breath, and wheezing.  No hemoptysis.  CARDIOVASCULAR: Positive for chest pain, no orthopnea, edema.  GASTROINTESTINAL: No nausea, patient had some vomiting.  No diarrhea or abdominal pain. No blood in bowel movements.  GENITOURINARY: Positive for dysuria. No hematuria.  ENDOCRINE: No polyuria, nocturia,  HEMATOLOGY: No anemia, easy bruising or bleeding SKIN: No rash or lesion. MUSCULOSKELETAL: No joint pain or arthritis.  Positive for rib pain NEUROLOGIC: No tingling, numbness, weakness.  PSYCHIATRY: No anxiety or depression.   MEDICATIONS AT HOME:   Prior to Admission medications   Medication Sig Start Date End Date Taking? Authorizing Provider  guaiFENesin (MUCINEX) 600 MG 12 hr tablet Take 600 mg by mouth 2 (two) times daily.   Yes [provider]  lisinopril (PRINIVIL,ZESTRIL) 20 MG tablet Take 20 mg by mouth daily.   Yes [provider]      VITAL SIGNS:  Blood pressure (!) 122/91, pulse (!) 128, temperature 98.1 F (36.7 C), temperature source Oral, resp. rate (!) 22, height 5\' 5"  (1.651 m), weight 45.4 kg (100 lb), last menstrual period 02/28/2012, SpO2 100 %.  PHYSICAL EXAMINATION:  GENERAL:  52 y.o.-year-old patient lying in the  bed with no acute distress.  EYES: Pupils equal, round, reactive to light and accommodation. No scleral icterus. Extraocular muscles intact.  HEENT: Head atraumatic, normocephalic. Oropharynx and nasopharynx clear.  NECK:  Supple, no jugular venous distention. No thyroid enlargement, no tenderness.  LUNGS: Decreased breath sounds bilaterally,  some expiratory first EKG shows a wheezing, rales,rhonchi or crepitation. No use of accessory muscles of respiration.  CARDIOVASCULAR: S1, S2 normal. No murmurs, rubs, or gallops.  ABDOMEN: Soft, nontender, nondistended. Bowel sounds present. No organomegaly or mass.  EXTREMITIES: No pedal edema, cyanosis,  or clubbing.  NEUROLOGIC: Cranial nerves II through XII are intact. Muscle strength 5/5 in all extremities. Sensation intact. Gait not checked.  PSYCHIATRIC: The patient is alert and oriented x 3.  SKIN: No rash, lesion, or ulcer.   LABORATORY PANEL:   CBC Recent Labs  Lab 02/17/18 0925  WBC 12.1*  HGB 13.7  HCT 41.6  PLT 398   ------------------------------------------------------------------------------------------------------------------  Chemistries  Recent Labs  Lab 02/17/18 0925  NA 137  K 3.9  CL 105  CO2 23  GLUCOSE 100*  BUN 15  CREATININE 0.70  CALCIUM 9.0   ------------------------------------------------------------------------------------------------------------------  Cardiac Enzymes Recent Labs  Lab 02/17/18 0925  TROPONINI <0.03   ------------------------------------------------------------------------------------------------------------------  RADIOLOGY:  Dg Chest 2 View  Result Date: 02/17/2018 CLINICAL DATA:  Chest pain since 02/13/2018.  History of COPD. EXAM: CHEST - 2 VIEW COMPARISON:  PA and lateral chest 10/10/2014. FINDINGS: The chest is hyperexpanded but the lungs are clear. Heart size is normal. No pneumothorax or pleural fluid. Aortic atherosclerosis is identified. No acute bony abnormality. IMPRESSION: No acute disease. COPD. Atherosclerosis. Electronically Signed   By: Inge Rise M.D.   On: 02/17/2018 09:44   Ct Angio Chest Pe W And/or Wo Contrast  Result Date: 02/17/2018 CLINICAL DATA:  Left-sided chest pain for the past 3 days. EXAM: CT ANGIOGRAPHY CHEST WITH CONTRAST TECHNIQUE: Multidetector CT imaging of the chest was performed using the standard protocol during bolus administration of intravenous contrast. Multiplanar CT image reconstructions and MIPs were obtained to evaluate the vascular anatomy. CONTRAST:  15mL ISOVUE-370 IOPAMIDOL (ISOVUE-370) INJECTION 76% COMPARISON:  Chest x-ray from same day. FINDINGS: Cardiovascular:  Satisfactory opacification of the pulmonary arteries to the segmental level. No evidence of pulmonary embolism. Normal heart size. No pericardial effusion. Normal caliber thoracic aorta. Mild atherosclerotic vascular calcification of the aortic arch. Mediastinum/Nodes: No enlarged mediastinal, hilar, or axillary lymph nodes. Thyroid gland, trachea, and esophagus demonstrate no significant findings. Lungs/Pleura: Mild upper lobe predominant centrilobular emphysema. Scarring in the right upper lobe. There are a few patchy consolidative opacities in the right upper lobe, right middle lobe, lingula, and right lower lobe. Faint centrilobular nodularity in the peripheral left upper lobe. No pleural effusion or pneumothorax. No suspicious pulmonary nodule. Upper Abdomen: No acute abnormality. Musculoskeletal: No chest wall abnormality. No acute or significant osseous findings. Review of the MIP images confirms the above findings. IMPRESSION: 1.  No evidence of pulmonary embolism. 2. A few patchy consolidative opacities involving the right lung and lingula, suspicious for multifocal pneumonia. Giving smoking history, follow-up noncontrast chest CT in 3 months is recommended to document resolution. 3.  Emphysema (ICD10-J43.9). 4.  Aortic atherosclerosis (ICD10-I70.0). Electronically Signed   By: Titus Dubin M.D.   On: 02/17/2018 10:49    EKG:   First EKG wide complex tachycardia 133 bpm Second EKG shows a sinus rhythm 88 bpm  IMPRESSION AND PLAN:   1.  Clinical sepsis with multifocal pneumonia, tachycardia  and leukocytosis.  ER physician prescribed Levaquin.  I will get a influenza swab, sputum culture and send off a pro-calcitonin.  Continue Levaquin at this time. 2.  Pleuritic chest pain and COPD exacerbation start Solu-Medrol 125 mg once and 40 mg IV daily.  Start budesonide and DuoNeb nebulizer solution. 3.  Essential hypertension on lisinopril 4.  Tobacco abuse.  Smoking cessation counseling done 4  minutes by me.  Nicotine patch ordered. 5. abnormal initial EKG and tachycardia- likely from respiratory issues. Monitor on telemetry.  I have personally reviewed the laboratory data EKG and chest x-ray and CT scan of the chest.  Management plans discussed with the patient, and she is in agreement.  CODE STATUS: Full code  TOTAL TIME TAKING CARE OF THIS PATIENT: 50 minutes.    Loletha Grayer M.D on 02/17/2018 at 12:20 PM  Between 7am to 6pm - Pager - [phone]  After 6pm call admission pager [phone]  [facility]  [phone]  CC: Primary care physician;  Dr. Rebeca Alert

## 2018-02-17 NOTE — ED Provider Notes (Addendum)
[facility] Emergency Department Provider Note       Time seen: ----------------------------------------- 9:32 AM on 02/17/2018 -----------------------------------------   I have reviewed the triage vital signs and the nursing notes.  HISTORY   Chief Complaint Chest Pain    HPI Deanna Ramsey is a 52 y.o. female with a history of arthritis, cellulitis and depression who presents to the ED for his pain shortness of breath.  Patient describes that started on Friday which she had pain with breathing under both ribs, worse on the left side.  She recently has had some cough with sputum production.  She has not noted that she has had a fast heart rate that she was found to have here on arrival.  She denies fevers or chills, pain is currently 5 out of 10 particular on the left side of her chest that is worse with coughing.  Past Medical History:  Diagnosis Date  . Arthritis   . Cellulitis   . Depression     Patient Active Problem List   Diagnosis Date Noted  . Cellulitis 04/15/2015    No past surgical history on file.  Allergies Sulfa antibiotics  Social History Social History   Tobacco Use  . Smoking status: Current Every Day Smoker    Packs/day: 1.00    Types: Cigarettes  Substance Use Topics  . Alcohol use: No  . Drug use: No    Review of Systems Constitutional: Negative for fever. Cardiovascular: Positive for chest pain  Respiratory: Negative for shortness of breath.  Positive for cough with sputum production Gastrointestinal: Negative for abdominal pain, vomiting and diarrhea. Musculoskeletal: Negative for back pain. Skin: Negative for rash. Neurological: Negative for headaches, focal weakness or numbness.  All systems negative/normal/unremarkable except as stated in the HPI  ____________________________________________   PHYSICAL EXAM:  VITAL SIGNS: ED Triage Vitals  Enc Vitals Group     BP 02/17/18 0912 (!) 122/91     Pulse  Rate 02/17/18 0912 (!) 148     Resp 02/17/18 0912 18     Temp 02/17/18 0912 98.1 F (36.7 C)     Temp Source 02/17/18 0912 Oral     SpO2 02/17/18 0912 99 %     Weight 02/17/18 0913 100 lb (45.4 kg)     Height 02/17/18 0913 5\' 5"  (1.651 m)     Head Circumference --      Peak Flow --      Pain Score 02/17/18 0912 7     Pain Loc --      Pain Edu? --      Excl. in [employer]? --    Constitutional: Alert and oriented.  Thin in appearance, mild distress Eyes: Conjunctivae are normal. Normal extraocular movements. ENT   Head: Normocephalic and atraumatic.   Nose: No congestion/rhinnorhea.   Mouth/Throat: Mucous membranes are moist.   Neck: No stridor. Cardiovascular: Rapid rate, regular rhythm. No murmurs, rubs, or gallops. Respiratory: Mild tachypnea, breath sounds are clear and equal bilaterally.  Scattered rhonchi Gastrointestinal: Soft and nontender. Normal bowel sounds Musculoskeletal: Nontender with normal range of motion in extremities. No lower extremity tenderness nor edema. Neurologic:  Normal speech and language. No gross focal neurologic deficits are appreciated.  Skin:  Skin is warm, dry and intact. No rash noted. Psychiatric: Mood and affect are normal. Speech and behavior are normal.  ____________________________________________  EKG: Interpreted by me.  Tachycardia with a rate of 133 bpm, potentially retro-conducted P waves, normal QT, normal axis Repeat EKG  interpreted by me, sinus rhythm rate 88 bpm, normal PR interval, normal QRS size, normal QT, normal axis ____________________________________________  ED COURSE:  As part of my medical decision making, I reviewed the following data within the [place] History obtained from family if available, nursing notes, old chart and ekg, as well as notes from prior ED visits. Patient presented for chest pain and difficulty breathing with tachycardia, we will assess with labs and imaging as indicated at  this time.   Procedures ____________________________________________   LABS (pertinent positives/negatives)  Labs Reviewed  BASIC METABOLIC PANEL - Abnormal; Notable for the following components:      Result Value   Glucose, Bld 100 (*)    All other components within normal limits  CBC - Abnormal; Notable for the following components:   WBC 12.1 (*)    All other components within normal limits  TROPONIN I  POC URINE PREG, ED  POCT PREGNANCY, URINE    RADIOLOGY Images were viewed by me Chest x-ray IMPRESSION: No acute disease.  COPD.  Atherosclerosis. Ct chest IMPRESSION: 1. No evidence of pulmonary embolism. 2. A few patchy consolidative opacities involving the right lung and lingula, suspicious for multifocal pneumonia. Giving smoking history, follow-up noncontrast chest CT in 3 months is recommended to document resolution. 3. Emphysema (ICD10-J43.9). 4. Aortic atherosclerosis (ICD10-I70.0). ____________________________________________  DIFFERENTIAL DIAGNOSIS   Pneumonia, pneumothorax, PE, lung cancer, tachydysrhythmia, muscle strain  FINAL ASSESSMENT AND PLAN  Tachycardia, chest pain, multifocal pneumonia   Plan: The patient had presented for pleuritic chest pain. Patient's labs revealed mild leukocytosis but was otherwise unremarkable. Patient's imaging did reveal a multifocal pneumonia.  She was given IV Levaquin and I had ordered IV Cardizem for her tachycardia but she subsequently converted to a normal sinus rhythm.  Due to her tachy-dysrhythmia and pneumonia I will discussed with the hospitalist for observation.   Laurence Aly, MD   Note: This note was generated in part or whole with voice recognition software. Voice recognition is usually quite accurate but there are transcription errors that can and very often do occur. I apologize for any typographical errors that were not detected and corrected.     Earleen Newport, MD 02/17/18  1132    Earleen Newport, MD 02/17/18 [phone]

## 2018-02-17 NOTE — Progress Notes (Signed)
ANTIBIOTIC CONSULT NOTE   Pharmacy Consult for Levaquin Indication: pneumonia and sepsis  Allergies  Allergen Reactions  . Sulfa Antibiotics Nausea And Vomiting    Patient Measurements: Height: 5\' 5"  (165.1 cm) Weight: 100 lb (45.4 kg) IBW/kg (Calculated) : 57  Vital Signs: Temp: 98.1 F (36.7 C) (03/12 0912) Temp Source: Oral (03/12 0912) BP: 125/102 (03/12 1230) Pulse Rate: 128 (03/12 1015) Intake/Output from previous day: No intake/output data recorded. Intake/Output from this shift: No intake/output data recorded.  Labs: Recent Labs    02/17/18 0925  WBC 12.1*  HGB 13.7  PLT 398  CREATININE 0.70   Estimated Creatinine Clearance: 59.6 mL/min (by C-G formula based on SCr of 0.7 mg/dL). No results for input(s): VANCOTROUGH, VANCOPEAK, VANCORANDOM, GENTTROUGH, GENTPEAK, GENTRANDOM, TOBRATROUGH, TOBRAPEAK, TOBRARND, AMIKACINPEAK, AMIKACINTROU, AMIKACIN in the last 72 hours.   Microbiology: No results found for this or any previous visit (from the past 720 hour(s)).  Medical History: Past Medical History:  Diagnosis Date  . Arthritis   . Cellulitis   . Depression   . Hypertension     Assessment: 52 y/o F admitted with PNA and sepsis.   Plan:  Levaquin 750 mg IV q 24 hours.   Ulice Dash D 02/17/2018,12:32 PM

## 2018-02-17 NOTE — ED Notes (Signed)
ED Provider at bedside. 

## 2018-02-18 LAB — BASIC METABOLIC PANEL
ANION GAP: 10 (ref 5–15)
BUN: 23 mg/dL — ABNORMAL HIGH (ref 6–20)
CHLORIDE: 108 mmol/L (ref 101–111)
CO2: 21 mmol/L — AB (ref 22–32)
Calcium: 8.7 mg/dL — ABNORMAL LOW (ref 8.9–10.3)
Creatinine, Ser: 0.86 mg/dL (ref 0.44–1.00)
GFR calc Af Amer: >60 mL/min (ref >60)
GFR calc non Af Amer: >60 mL/min (ref >60)
GLUCOSE: 141 mg/dL — AB (ref 65–99)
POTASSIUM: 4.2 mmol/L (ref 3.5–5.1)
Sodium: 139 mmol/L (ref 135–145)

## 2018-02-18 LAB — CBC
HEMATOCRIT: 34.1 % — AB (ref 35.0–47.0)
HEMOGLOBIN: 11.2 g/dL — AB (ref 12.0–16.0)
MCH: 31.3 pg (ref 26.0–34.0)
MCHC: 33 g/dL (ref 32.0–36.0)
MCV: 94.8 fL (ref 80.0–100.0)
Platelets: 310 10*3/uL (ref 150–440)
RBC: 3.59 MIL/uL — ABNORMAL LOW (ref 3.80–5.20)
RDW: 13.4 % (ref 11.5–14.5)
WBC: 8.4 10*3/uL (ref 3.6–11.0)

## 2018-02-18 LAB — PROCALCITONIN: Procalcitonin: <0.1 ng/mL

## 2018-02-18 MED ORDER — ENSURE ENLIVE PO LIQD
237.0000 mL | Freq: Two times a day (BID) | ORAL | Status: DC
  Administered 2018-02-18: 237 mL via ORAL

## 2018-02-18 MED ORDER — NICOTINE 21 MG/24HR TD PT24: 21.0000 mg | MEDICATED_PATCH | Freq: Every day | TRANSDERMAL | 0 refills | Status: DC

## 2018-02-18 MED ORDER — ENSURE ENLIVE PO LIQD: 237.0000 mL | Freq: Two times a day (BID) | ORAL | 12 refills | Status: DC

## 2018-02-18 MED ORDER — LEVOFLOXACIN 750 MG PO TABS: 750.0000 mg | ORAL_TABLET | Freq: Every day | ORAL | 0 refills | Status: DC

## 2018-02-18 NOTE — Discharge Summary (Signed)
[facility] NAME: Deanna Ramsey    MR#:  [id number]  DATE OF BIRTH:  05-21-1966  DATE OF ADMISSION:  02/17/2018 ADMITTING PHYSICIAN: Loletha Grayer, MD  DATE OF DISCHARGE: 02/18/2018  PRIMARY CARE PHYSICIAN: [facility] [place]    ADMISSION DIAGNOSIS:  Tachycardia [R00.0] Nonspecific chest pain [R07.9] Multifocal pneumonia [J18.9]  DISCHARGE DIAGNOSIS:  Active Problems:   Sepsis ([place])   SECONDARY DIAGNOSIS:   Past Medical History:  Diagnosis Date  . Arthritis   . Cellulitis   . Depression   . Hypertension     HOSPITAL COURSE:   52 year old female with history of essential hypertension and tobacco dependence who presented initially with chest pain.  1. Sepsis: Patient presents with tachycardia and leukocytosis present sepsis is due to multifocal pneumonia. Sepsis is resolved.  2. Pneumonia, community-acquired: This is the etiology of her chest pain. CT chest was negative for PE. She is doing quite well today. She will be discharged on oral Levaquin.  3. Essential hypertension: Continue lisinopril  4.Tobacco dependence: Patient is encouraged to quit smoking. Counseling was provided for 4 minutes. She would like nicotine patch upon discharge  DISCHARGE CONDITIONS AND DIET:   Stable for discharge on heart healthy diet  CONSULTS OBTAINED:    DRUG ALLERGIES:   Allergies  Allergen Reactions  . Sulfa Antibiotics Nausea And Vomiting    DISCHARGE MEDICATIONS:   Allergies as of 02/18/2018      Reactions   Sulfa Antibiotics Nausea And Vomiting      Medication List    TAKE these medications   feeding supplement (ENSURE ENLIVE) Liqd Take 237 mLs by mouth 2 (two) times daily between meals.   guaiFENesin 600 MG 12 hr tablet Commonly known as:  MUCINEX Take 600 mg by mouth 2 (two) times daily.   levofloxacin 750 MG tablet Commonly known as:  LEVAQUIN Take 1 tablet (750 mg total) by mouth  daily.   lisinopril 20 MG tablet Commonly known as:  PRINIVIL,ZESTRIL Take 20 mg by mouth daily.   nicotine 21 mg/24hr patch Commonly known as:  NICODERM CQ - dosed in mg/24 hours Place 1 patch (21 mg total) onto the skin daily. Start taking on:  02/19/2018         Today   CHIEF COMPLAINT:  Patient doing very well this morning.   VITAL SIGNS:  Blood pressure (!) 145/69, pulse 83, temperature 97.7 F (36.5 C), temperature source Oral, resp. rate 16, height 5\' 5"  (1.651 m), weight 45.4 kg (100 lb), last menstrual period 02/28/2012, SpO2 100 %.   REVIEW OF SYSTEMS:  Review of Systems  Constitutional: Negative.  Negative for chills, fever and malaise/fatigue.  HENT: Negative.  Negative for ear discharge, ear pain, hearing loss, nosebleeds and sore throat.   Eyes: Negative.  Negative for blurred vision and pain.  Respiratory: Negative.  Negative for cough, hemoptysis, shortness of breath and wheezing.   Cardiovascular: Negative.  Negative for chest pain, palpitations and leg swelling.  Gastrointestinal: Negative.  Negative for abdominal pain, blood in stool, diarrhea, nausea and vomiting.  Genitourinary: Negative.  Negative for dysuria.  Musculoskeletal: Negative.  Negative for back pain.  Skin: Negative.   Neurological: Negative for dizziness, tremors, speech change, focal weakness, seizures and headaches.  Endo/Heme/Allergies: Negative.  Does not bruise/bleed easily.  Psychiatric/Behavioral: Negative.  Negative for depression, hallucinations and suicidal ideas.     PHYSICAL EXAMINATION:  GENERAL:  52 y.o.-year-old patient lying in  the bed with no acute distress.  NECK:  Supple, no jugular venous distention. No thyroid enlargement, no tenderness.  LUNGS: Normal breath sounds bilaterally, no wheezing, rales,rhonchi  No use of accessory muscles of respiration.  CARDIOVASCULAR: S1, S2 normal. No murmurs, rubs, or gallops.  ABDOMEN: Soft, non-tender, non-distended. Bowel  sounds present. No organomegaly or mass.  EXTREMITIES: No pedal edema, cyanosis, or clubbing.  PSYCHIATRIC: The patient is alert and oriented x 3.  SKIN: No obvious rash, lesion, or ulcer.   DATA REVIEW:   CBC Recent Labs  Lab 02/18/18 0411  WBC 8.4  HGB 11.2*  HCT 34.1*  PLT 310    Chemistries  Recent Labs  Lab 02/18/18 0411  NA 139  K 4.2  CL 108  CO2 21*  GLUCOSE 141*  BUN 23*  CREATININE 0.86  CALCIUM 8.7*    Cardiac Enzymes Recent Labs  Lab 02/17/18 0925  TROPONINI <0.03    Microbiology Results  @MICRORSLT48 @  RADIOLOGY:  Dg Chest 2 View  Result Date: 02/17/2018 CLINICAL DATA:  Chest pain since 02/13/2018.  History of COPD. EXAM: CHEST - 2 VIEW COMPARISON:  PA and lateral chest 10/10/2014. FINDINGS: The chest is hyperexpanded but the lungs are clear. Heart size is normal. No pneumothorax or pleural fluid. Aortic atherosclerosis is identified. No acute bony abnormality. IMPRESSION: No acute disease. COPD. Atherosclerosis. Electronically Signed   By: Inge Rise M.D.   On: 02/17/2018 09:44   Ct Angio Chest Pe W And/or Wo Contrast  Result Date: 02/17/2018 CLINICAL DATA:  Left-sided chest pain for the past 3 days. EXAM: CT ANGIOGRAPHY CHEST WITH CONTRAST TECHNIQUE: Multidetector CT imaging of the chest was performed using the standard protocol during bolus administration of intravenous contrast. Multiplanar CT image reconstructions and MIPs were obtained to evaluate the vascular anatomy. CONTRAST:  14mL ISOVUE-370 IOPAMIDOL (ISOVUE-370) INJECTION 76% COMPARISON:  Chest x-ray from same day. FINDINGS: Cardiovascular: Satisfactory opacification of the pulmonary arteries to the segmental level. No evidence of pulmonary embolism. Normal heart size. No pericardial effusion. Normal caliber thoracic aorta. Mild atherosclerotic vascular calcification of the aortic arch. Mediastinum/Nodes: No enlarged mediastinal, hilar, or axillary lymph nodes. Thyroid gland, trachea, and  esophagus demonstrate no significant findings. Lungs/Pleura: Mild upper lobe predominant centrilobular emphysema. Scarring in the right upper lobe. There are a few patchy consolidative opacities in the right upper lobe, right middle lobe, lingula, and right lower lobe. Faint centrilobular nodularity in the peripheral left upper lobe. No pleural effusion or pneumothorax. No suspicious pulmonary nodule. Upper Abdomen: No acute abnormality. Musculoskeletal: No chest wall abnormality. No acute or significant osseous findings. Review of the MIP images confirms the above findings. IMPRESSION: 1.  No evidence of pulmonary embolism. 2. A few patchy consolidative opacities involving the right lung and lingula, suspicious for multifocal pneumonia. Giving smoking history, follow-up noncontrast chest CT in 3 months is recommended to document resolution. 3.  Emphysema (ICD10-J43.9). 4.  Aortic atherosclerosis (ICD10-I70.0). Electronically Signed   By: Titus Dubin M.D.   On: 02/17/2018 10:49      Allergies as of 02/18/2018      Reactions   Sulfa Antibiotics Nausea And Vomiting      Medication List    TAKE these medications   feeding supplement (ENSURE ENLIVE) Liqd Take 237 mLs by mouth 2 (two) times daily between meals.   guaiFENesin 600 MG 12 hr tablet Commonly known as:  MUCINEX Take 600 mg by mouth 2 (two) times daily.   levofloxacin 750 MG tablet Commonly known  asMack Hook Take 1 tablet (750 mg total) by mouth daily.   lisinopril 20 MG tablet Commonly known as:  PRINIVIL,ZESTRIL Take 20 mg by mouth daily.   nicotine 21 mg/24hr patch Commonly known as:  NICODERM CQ - dosed in mg/24 hours Place 1 patch (21 mg total) onto the skin daily. Start taking on:  02/19/2018         Management plans discussed with the patient and she is in agreement. Stable for discharge home  Patient should follow up with pcp  CODE STATUS:     Code Status Orders  (From admission, onward)        Start      Ordered   02/17/18 1218  Full code  Continuous     02/17/18 1218    Code Status History    Date Active Date Inactive Code Status Order ID Comments User Context   04/15/2015 20:35 04/18/2015 14:44 Full Code [id number]  Hower, Aaron Mose, MD ED    Advance Directive Documentation     Most Recent Value  Type of Advance Directive  Healthcare Power of Attorney  Pre-existing out of facility DNR order (yellow form or pink MOST form)  No data  "MOST" Form in Place?  No data      TOTAL TIME TAKING CARE OF THIS PATIENT: 38 minutes.    Note: This dictation was prepared with Dragon dictation along with smaller phrase technology. Any transcriptional errors that result from this process are unintentional.  Marylon Verno M.D on 02/18/2018 at 10:40 AM  Between 7am to 6pm - Pager - [phone] After 6pm go to [email or web address] - password [email or web address] [facility]  Office  [phone]  CC: Primary care physician; [facility] [place]

## 2018-02-18 NOTE — Progress Notes (Signed)
Initial Nutrition Assessment  DOCUMENTATION CODES:   Non-severe (moderate) malnutrition in context of chronic illness  INTERVENTION:   Ensure Enlive po BID, each supplement provides 350 kcal and 20 grams of protein  Liberalize diet  NUTRITION DIAGNOSIS:   Moderate Malnutrition related to chronic illness(COPD) as evidenced by moderate fat depletion, moderate muscle depletion.  GOAL:   Patient will meet greater than or equal to 90% of their needs  MONITOR:   PO intake, Supplement acceptance, Labs, Weight trends, I & O's  REASON FOR ASSESSMENT:   Other (Comment)(low BMI)    ASSESSMENT:   52 y/o female admitted with COPD exacerbation and PNA   Met with pt in room today. Pt reports good appetite and oral intake pta. Pt ate 100% of her breakfast this morning. Pt reports that she does not drink any supplements at home. Pt reports a 30lb weight loss over the past few years but per chart, pt has weighed between 100-110lbs since 2014. RD discussed with pt the importance of adequate protein intake with COPD. Pt is willing to try chocolate Ensure in hospital. RD will order supplements and liberalize diet as a heart healthy diet restricts protein also.   Medications reviewed and include: lovenox, solu-medrol, nicotine, NaCl @50ml /hr  Labs reviewed: BUN 23(H), Ca 8.7(L)  NUTRITION - FOCUSED PHYSICAL EXAM:    Most Recent Value  Orbital Region  Mild depletion  Upper Arm Region  Moderate depletion  Thoracic and Lumbar Region  Moderate depletion  Buccal Region  No depletion  Temple Region  Mild depletion  Clavicle Bone Region  Moderate depletion  Clavicle and Acromion Bone Region  Moderate depletion  Scapular Bone Region  Moderate depletion  Dorsal Hand  Moderate depletion  Patellar Region  Unable to assess  Anterior Thigh Region  Unable to assess  Posterior Calf Region  Unable to assess  Edema (RD Assessment)  None  Hair  Reviewed  Eyes  Reviewed  Mouth  Reviewed  Skin   Reviewed  Nails  Reviewed       Diet Order:  Diet regular Room service appropriate? Yes; Fluid consistency: Thin  EDUCATION NEEDS:   Education needs have been addressed  Skin:  Reviewed RN Assessment  Last BM:  3/12  Height:   Ht Readings from Last 1 Encounters:  02/17/18 5' 5"  (1.651 m)    Weight:   Wt Readings from Last 1 Encounters:  02/17/18 100 lb (45.4 kg)    Ideal Body Weight:  56.8 kg  BMI:  Body mass index is 16.64 kg/m.  Estimated Nutritional Needs:   Kcal:  1400-1600kcal/day   Protein:  68-77g/day   Fluid:  >1.2L/day   Koleen Distance MS, RD, LDN Pager #[phone] After Hours Pager: [phone]

## 2018-02-18 NOTE — Progress Notes (Signed)
Notified Dr. Jannifer Franklin of patient still in pain after receiving toradol. Norco q6hrs ordered at this time.

## 2018-02-18 NOTE — Progress Notes (Signed)
Pt discharged per MD order. IV removed. Prescription given to pt with coupon for prescription. Discharge instructions reviewed with pt. Pt declined wheelchair and chose to walk downstairs to her car.

## 2018-02-18 NOTE — Care Management (Signed)
Patient discharged home today.  Follows up at [facility], and normally uses their pharmacy.  Patient to discharge on oral Levaquin.  Requests for coupon to be printed for [facility].  Coupon printed from [email or web address]/. Out of pocket cost $16.12.  Patient denies issues obtaining medications. RNCM signing off.

## 2018-02-19 LAB — URINE CULTURE
Culture: >=100000 — AB
SPECIAL REQUESTS: NORMAL

## 2018-02-19 LAB — HIV ANTIBODY (ROUTINE TESTING W REFLEX): HIV SCREEN 4TH GENERATION: NONREACTIVE

## 2018-05-27 ENCOUNTER — Emergency Department: Payer: Self-pay

## 2018-05-27 ENCOUNTER — Other Ambulatory Visit: Payer: Self-pay

## 2018-05-27 ENCOUNTER — Emergency Department
Admission: EM | Admit: 2018-05-27 | Discharge: 2018-05-27 | Disposition: A | Discharge status: Home / Self Care | Payer: Self-pay | Attending: Student in an Organized Health Care Education/Training Program | Admitting: Student in an Organized Health Care Education/Training Program

## 2018-05-27 DIAGNOSIS — J449 Chronic obstructive pulmonary disease, unspecified: Secondary | ICD-10-CM | POA: Insufficient documentation

## 2018-05-27 DIAGNOSIS — Z79899 Other long term (current) drug therapy: Secondary | ICD-10-CM | POA: Insufficient documentation

## 2018-05-27 DIAGNOSIS — F17210 Nicotine dependence, cigarettes, uncomplicated: Secondary | ICD-10-CM | POA: Insufficient documentation

## 2018-05-27 DIAGNOSIS — J209 Acute bronchitis, unspecified: Secondary | ICD-10-CM | POA: Insufficient documentation

## 2018-05-27 LAB — BASIC METABOLIC PANEL
Anion gap: 10 (ref 5–15)
BUN: 19 mg/dL (ref 6–20)
CO2: 22 mmol/L (ref 22–32)
CREATININE: 0.92 mg/dL (ref 0.44–1.00)
Calcium: 9.1 mg/dL (ref 8.9–10.3)
Chloride: 106 mmol/L (ref 101–111)
GFR calc Af Amer: >60 mL/min (ref >60)
GLUCOSE: 107 mg/dL — AB (ref 65–99)
POTASSIUM: 4.2 mmol/L (ref 3.5–5.1)
Sodium: 138 mmol/L (ref 135–145)

## 2018-05-27 LAB — CBC
HEMATOCRIT: 41.4 % (ref 35.0–47.0)
Hemoglobin: 13.9 g/dL (ref 12.0–16.0)
MCH: 31.8 pg (ref 26.0–34.0)
MCHC: 33.5 g/dL (ref 32.0–36.0)
MCV: 95 fL (ref 80.0–100.0)
PLATELETS: 317 10*3/uL (ref 150–440)
RBC: 4.36 MIL/uL (ref 3.80–5.20)
RDW: 13.6 % (ref 11.5–14.5)
WBC: 15.7 10*3/uL — ABNORMAL HIGH (ref 3.6–11.0)

## 2018-05-27 LAB — TROPONIN I: Troponin I: <0.03 ng/mL (ref <0.03)

## 2018-05-27 MED ORDER — ALBUTEROL SULFATE HFA 108 (90 BASE) MCG/ACT IN AERS: 2.0000 | INHALATION_SPRAY | Freq: Four times a day (QID) | RESPIRATORY_TRACT | 2 refills | Status: DC | PRN

## 2018-05-27 MED ORDER — PREDNISONE 20 MG PO TABS
60.0000 mg | ORAL_TABLET | Freq: Once | ORAL | Status: AC
  Administered 2018-05-27: 60 mg via ORAL
  Filled 2018-05-27: qty 3

## 2018-05-27 MED ORDER — DOXYCYCLINE HYCLATE 100 MG PO TABS
100.0000 mg | ORAL_TABLET | Freq: Once | ORAL | Status: AC
  Administered 2018-05-27: 100 mg via ORAL
  Filled 2018-05-27: qty 1

## 2018-05-27 MED ORDER — DOXYCYCLINE HYCLATE 100 MG PO TABS: 100.0000 mg | ORAL_TABLET | Freq: Two times a day (BID) | ORAL | 0 refills | Status: AC

## 2018-05-27 MED ORDER — IPRATROPIUM-ALBUTEROL 0.5-2.5 (3) MG/3ML IN SOLN
3.0000 mL | Freq: Once | RESPIRATORY_TRACT | Status: AC
  Administered 2018-05-27: 3 mL via RESPIRATORY_TRACT
  Filled 2018-05-27: qty 3

## 2018-05-27 MED ORDER — PREDNISONE 20 MG PO TABS: 40.0000 mg | ORAL_TABLET | Freq: Every day | ORAL | 0 refills | Status: AC

## 2018-05-27 MED ORDER — FLUCONAZOLE 150 MG PO TABS: 150.0000 mg | ORAL_TABLET | Freq: Once | ORAL | 0 refills | Status: AC

## 2018-05-27 NOTE — ED Notes (Signed)
Pt c/o a cough with green colored sputum for the past 3 days, states she had pneumonia in march and just never seemed to fully recover from that .

## 2018-05-27 NOTE — ED Triage Notes (Signed)
Chest pain and nonproductive cough X 3 days. Hx of pneumonia is March, patient is concerned that she has same type of symptoms.  Pt alert and oriented X4, active, cooperative, pt in NAD. RR even and unlabored, color WNL.

## 2018-05-27 NOTE — ED Provider Notes (Signed)
[facility] Emergency Department Provider Note    First MD Initiated Contact with Patient 05/27/18 1437     (approximate)  I have reviewed the triage vital signs and the nursing notes.   HISTORY  Chief Complaint Chest Pain    HPI Deanna Ramsey is a 52 y.o. female with a history of COPD with 1 pack/day smoking history presents to the ER with 3 days of worsening productive cough with green-tinged sputum.  No measured fevers worsening shortness of breath but states it feels similar to previous episode of pneumonia back in March.  Denies any nausea or vomiting.  No chest pain.  Does feel a rattle in her chest when she takes a deep breath and has been feeling some generalized malaise.  Denies any flank pain.  No abdominal pain.  No lower extremity swelling.  States this does feel somewhat similar to previous episodes of COPD exacerbation.  She was supposed to be given a follow-up with pulmonology but never did this due to her work schedule.  Is also requesting work note to be off until Friday.    Past Medical History:  Diagnosis Date  . Arthritis   . Cellulitis   . Depression   . Hypertension    Family History  Problem Relation Age of Onset  . COPD Other   . COPD Mother   . Hypertension Mother   . COPD Father    Past Surgical History:  Procedure Laterality Date  . arm surgery    . CLEFT LIP REPAIR    . kidney stones    . lymphnode neck     Patient Active Problem List   Diagnosis Date Noted  . Sepsis ([place]) 02/17/2018  . Cellulitis 04/15/2015      Prior to Admission medications   Medication Sig Start Date End Date Taking? Authorizing Provider  feeding supplement, ENSURE ENLIVE, (ENSURE ENLIVE) LIQD Take 237 mLs by mouth 2 (two) times daily between meals. 02/18/18   Bettey Costa, MD  guaiFENesin (MUCINEX) 600 MG 12 hr tablet Take 600 mg by mouth 2 (two) times daily.    [provider]  levofloxacin (LEVAQUIN) 750 MG tablet Take 1 tablet (750  mg total) by mouth daily. 02/18/18   Bettey Costa, MD  lisinopril (PRINIVIL,ZESTRIL) 20 MG tablet Take 20 mg by mouth daily.    [provider]  nicotine (NICODERM CQ - DOSED IN MG/24 HOURS) 21 mg/24hr patch Place 1 patch (21 mg total) onto the skin daily. 02/19/18   Bettey Costa, MD    Allergies Sulfa antibiotics    Social History Social History   Tobacco Use  . Smoking status: Current Every Day Smoker    Packs/day: 1.00    Types: Cigarettes  . Smokeless tobacco: Never Used  Substance Use Topics  . Alcohol use: No  . Drug use: No    Review of Systems Patient denies headaches, rhinorrhea, blurry vision, numbness, shortness of breath, chest pain, edema, cough, abdominal pain, nausea, vomiting, diarrhea, dysuria, fevers, rashes or hallucinations unless otherwise stated above in HPI. ____________________________________________   PHYSICAL EXAM:  VITAL SIGNS: Vitals:   05/27/18 1245  BP: (!) 132/91  Pulse: 93  Resp: 20  Temp: 99.6 F (37.6 C)  SpO2: 100%    Constitutional: Alert and oriented.  Eyes: Conjunctivae are normal.  Head: Atraumatic. Nose: No congestion/rhinnorhea. Mouth/Throat: Mucous membranes are moist.   Neck: No stridor. Painless ROM.  Cardiovascular: Normal rate, regular rhythm. Grossly normal heart sounds.  Good peripheral circulation. Respiratory: Normal respiratory effort.  No retractions. Lungs with coarse tubular breathsounds in bilateral bases but R>L Gastrointestinal: Soft and nontender. No distention. No abdominal bruits. No CVA tenderness. Genitourinary:  Musculoskeletal: No lower extremity tenderness nor edema.  No joint effusions. Neurologic:  Normal speech and language. No gross focal neurologic deficits are appreciated. No facial droop Skin:  Skin is warm, dry and intact. No rash noted. Psychiatric: Mood and affect are normal. Speech and behavior are normal.  ____________________________________________   LABS (all labs ordered  are listed, but only abnormal results are displayed)  Results for orders placed or performed during the hospital encounter of 05/27/18 (from the past 24 hour(s))  Basic metabolic panel     Status: Abnormal   Collection Time: 05/27/18 12:47 PM  Result Value Ref Range   Sodium 138 135 - 145 mmol/L   Potassium 4.2 3.5 - 5.1 mmol/L   Chloride 106 101 - 111 mmol/L   CO2 22 22 - 32 mmol/L   Glucose, Bld 107 (H) 65 - 99 mg/dL   BUN 19 6 - 20 mg/dL   Creatinine, Ser 0.92 0.44 - 1.00 mg/dL   Calcium 9.1 8.9 - 10.3 mg/dL   GFR calc non Af Amer >60 >60 mL/min   GFR calc Af Amer >60 >60 mL/min   Anion gap 10 5 - 15  CBC     Status: Abnormal   Collection Time: 05/27/18 12:47 PM  Result Value Ref Range   WBC 15.7 (H) 3.6 - 11.0 K/uL   RBC 4.36 3.80 - 5.20 MIL/uL   Hemoglobin 13.9 12.0 - 16.0 g/dL   HCT 41.4 35.0 - 47.0 %   MCV 95.0 80.0 - 100.0 fL   MCH 31.8 26.0 - 34.0 pg   MCHC 33.5 32.0 - 36.0 g/dL   RDW 13.6 11.5 - 14.5 %   Platelets 317 150 - 440 K/uL  Troponin I     Status: None   Collection Time: 05/27/18 12:47 PM  Result Value Ref Range   Troponin I <0.03 <0.03 ng/mL   ____________________________________________  EKG My review and personal interpretation at Time: 12:37   Indication: sob  Rate: 95  Rhythm: sinus Axis: normal Other: normal intervals, nonspecific st abn, no stemi ____________________________________________  RADIOLOGY  I personally reviewed all radiographic images ordered to evaluate for the above acute complaints and reviewed radiology reports and findings.  These findings were personally discussed with the patient.  Please see medical record for radiology report.  ____________________________________________   PROCEDURES  Procedure(s) performed:  Procedures    Critical Care performed: no ____________________________________________   INITIAL IMPRESSION / ASSESSMENT AND PLAN / ED COURSE  Pertinent labs & imaging results that were available  during my care of the patient were reviewed by me and considered in my medical decision making (see chart for details).   DDX: Asthma, copd, CHF, pna, ptx, malignancy, Pe, anemia   Deanna Ramsey is a 52 y.o. who presents to the ED with symptoms as described above for the past 3 days.  Patient afebrile with no hypoxia and normotensive.  Breath sounds on presentation concerning for acute bronchitis.  Given her productive phlegm with underlying COPD will be started on steroids as well as nebulizer treatment and discharged home with bronchodilators as well as antibiotics.  This does not seem clinically consistent with PE given her lack of tachycardia or hypoxia with abnormal breath sounds.  No evidence of consolidation or effusion on chest x-ray.  Not  clinically consistent with ACS or dissection.  Have discussed with the patient and available family all diagnostics and treatments performed thus far and all questions were answered to the best of my ability. The patient demonstrates understanding and agreement with plan.       As part of my medical decision making, I reviewed the following data within the [place] notes reviewed and incorporated, Labs reviewed, notes from prior ED visits.   ____________________________________________   FINAL CLINICAL IMPRESSION(S) / ED DIAGNOSES  Final diagnoses:  Acute bronchitis, unspecified organism      NEW MEDICATIONS STARTED DURING THIS VISIT:  New Prescriptions   No medications on file     Note:  This document was prepared using Dragon voice recognition software and may include unintentional dictation errors.    Merlyn Lot, MD 05/27/18 [id number]

## 2018-07-01 ENCOUNTER — Emergency Department: Payer: Self-pay

## 2018-07-01 ENCOUNTER — Emergency Department
Admission: EM | Admit: 2018-07-01 | Discharge: 2018-07-01 | Disposition: A | Discharge status: Home / Self Care | Payer: Self-pay | Attending: Emergency Medicine | Admitting: Emergency Medicine

## 2018-07-01 ENCOUNTER — Other Ambulatory Visit: Payer: Self-pay

## 2018-07-01 ENCOUNTER — Encounter: Payer: Self-pay | Admitting: Emergency Medicine

## 2018-07-01 DIAGNOSIS — R0602 Shortness of breath: Secondary | ICD-10-CM | POA: Insufficient documentation

## 2018-07-01 DIAGNOSIS — Z5321 Procedure and treatment not carried out due to patient leaving prior to being seen by health care provider: Secondary | ICD-10-CM | POA: Insufficient documentation

## 2018-07-01 DIAGNOSIS — M542 Cervicalgia: Secondary | ICD-10-CM | POA: Insufficient documentation

## 2018-07-01 MED ORDER — ALBUTEROL SULFATE (2.5 MG/3ML) 0.083% IN NEBU: 5.0000 mg | INHALATION_SOLUTION | Freq: Once | RESPIRATORY_TRACT | Status: DC

## 2018-07-01 NOTE — ED Notes (Addendum)
While gathering supplies to start protocols on pt she states that if she is going to have to wait in the lobby she is leaving. Pt encouraged to stay and informed that the staff in this ED are wanting to help her and although there is not a room to place her in at this moment we would start with her blood work and xray. Pt denies wanting any help and states she wants to go to [facility] in the morning. If pain worsens she will return or go to [facility]. Pt encouraged again to stay but pt refused.

## 2018-07-01 NOTE — ED Triage Notes (Signed)
Pt in via [facility] from home with complaints of head cold and neck pain since Sunday.  Pt recently on antibiotics for bronchitis. Pt appears uncomfortable in triage, labored breathing noted.

## 2018-07-02 ENCOUNTER — Encounter: Payer: Self-pay | Admitting: Emergency Medicine

## 2018-07-02 ENCOUNTER — Emergency Department: Payer: Self-pay

## 2018-07-02 ENCOUNTER — Inpatient Hospital Stay
Admission: EM | Admit: 2018-07-02 | Discharge: 2018-07-04 | DRG: 872 | Disposition: A | Discharge status: Home / Self Care | Payer: Self-pay | Attending: Internal Medicine | Admitting: Internal Medicine

## 2018-07-02 ENCOUNTER — Other Ambulatory Visit: Payer: Self-pay

## 2018-07-02 DIAGNOSIS — E43 Unspecified severe protein-calorie malnutrition: Secondary | ICD-10-CM

## 2018-07-02 DIAGNOSIS — J36 Peritonsillar abscess: Secondary | ICD-10-CM

## 2018-07-02 DIAGNOSIS — J42 Unspecified chronic bronchitis: Secondary | ICD-10-CM | POA: Diagnosis present

## 2018-07-02 DIAGNOSIS — F17210 Nicotine dependence, cigarettes, uncomplicated: Secondary | ICD-10-CM | POA: Diagnosis present

## 2018-07-02 DIAGNOSIS — Z882 Allergy status to sulfonamides status: Secondary | ICD-10-CM

## 2018-07-02 DIAGNOSIS — M1990 Unspecified osteoarthritis, unspecified site: Secondary | ICD-10-CM | POA: Diagnosis present

## 2018-07-02 DIAGNOSIS — Z825 Family history of asthma and other chronic lower respiratory diseases: Secondary | ICD-10-CM

## 2018-07-02 DIAGNOSIS — L040 Acute lymphadenitis of face, head and neck: Secondary | ICD-10-CM | POA: Diagnosis present

## 2018-07-02 DIAGNOSIS — I10 Essential (primary) hypertension: Secondary | ICD-10-CM | POA: Diagnosis present

## 2018-07-02 DIAGNOSIS — Z8249 Family history of ischemic heart disease and other diseases of the circulatory system: Secondary | ICD-10-CM

## 2018-07-02 DIAGNOSIS — B370 Candidal stomatitis: Secondary | ICD-10-CM | POA: Diagnosis present

## 2018-07-02 DIAGNOSIS — J391 Other abscess of pharynx: Secondary | ICD-10-CM | POA: Diagnosis present

## 2018-07-02 DIAGNOSIS — Z87442 Personal history of urinary calculi: Secondary | ICD-10-CM

## 2018-07-02 DIAGNOSIS — A419 Sepsis, unspecified organism: Principal | ICD-10-CM | POA: Diagnosis present

## 2018-07-02 LAB — CBC
HCT: 34.3 % — ABNORMAL LOW (ref 35.0–47.0)
HEMOGLOBIN: 11.7 g/dL — AB (ref 12.0–16.0)
MCH: 32.2 pg (ref 26.0–34.0)
MCHC: 34.2 g/dL (ref 32.0–36.0)
MCV: 94.3 fL (ref 80.0–100.0)
PLATELETS: 231 10*3/uL (ref 150–440)
RBC: 3.64 MIL/uL — AB (ref 3.80–5.20)
RDW: 13.6 % (ref 11.5–14.5)
WBC: 18 10*3/uL — AB (ref 3.6–11.0)

## 2018-07-02 LAB — TSH: TSH: 0.903 u[IU]/mL (ref 0.350–4.500)

## 2018-07-02 LAB — COMPREHENSIVE METABOLIC PANEL
ALK PHOS: 97 U/L (ref 38–126)
ALT: 17 U/L (ref 0–44)
AST: 23 U/L (ref 15–41)
Albumin: 3.1 g/dL — ABNORMAL LOW (ref 3.5–5.0)
Anion gap: 9 (ref 5–15)
BUN: 25 mg/dL — AB (ref 6–20)
CO2: 22 mmol/L (ref 22–32)
CREATININE: 0.91 mg/dL (ref 0.44–1.00)
Calcium: 8.6 mg/dL — ABNORMAL LOW (ref 8.9–10.3)
Chloride: 105 mmol/L (ref 98–111)
GFR calc Af Amer: >60 mL/min (ref >60)
Glucose, Bld: 124 mg/dL — ABNORMAL HIGH (ref 70–99)
Potassium: 3.3 mmol/L — ABNORMAL LOW (ref 3.5–5.1)
SODIUM: 136 mmol/L (ref 135–145)
TOTAL PROTEIN: 6.9 g/dL (ref 6.5–8.1)
Total Bilirubin: 0.5 mg/dL (ref 0.3–1.2)

## 2018-07-02 LAB — GROUP A STREP BY PCR: Group A Strep by PCR: DETECTED — AB

## 2018-07-02 LAB — LACTIC ACID, PLASMA: LACTIC ACID, VENOUS: 0.4 mmol/L — AB (ref 0.5–1.9)

## 2018-07-02 LAB — HEMOGLOBIN A1C
Hgb A1c MFr Bld: 5.8 % — ABNORMAL HIGH (ref 4.8–5.6)
Mean Plasma Glucose: 119.76 mg/dL

## 2018-07-02 MED ORDER — LISINOPRIL 20 MG PO TABS
20.0000 mg | ORAL_TABLET | Freq: Every day | ORAL | Status: DC
  Administered 2018-07-02 – 2018-07-04 (×3): 20 mg via ORAL
  Filled 2018-07-02 (×3): qty 1

## 2018-07-02 MED ORDER — NICOTINE 21 MG/24HR TD PT24
21.0000 mg | MEDICATED_PATCH | Freq: Every day | TRANSDERMAL | Status: DC
  Administered 2018-07-02 – 2018-07-04 (×3): 21 mg via TRANSDERMAL
  Filled 2018-07-02 (×3): qty 1

## 2018-07-02 MED ORDER — DEXAMETHASONE SODIUM PHOSPHATE 4 MG/ML IJ SOLN
8.0000 mg | Freq: Once | INTRAMUSCULAR | Status: AC
  Administered 2018-07-02: 8 mg via INTRAVENOUS
  Filled 2018-07-02: qty 2

## 2018-07-02 MED ORDER — GUAIFENESIN 100 MG/5ML PO SOLN
5.0000 mL | Freq: Four times a day (QID) | ORAL | Status: DC | PRN
  Administered 2018-07-03 – 2018-07-04 (×3): 100 mg via ORAL
  Filled 2018-07-02 (×4): qty 5

## 2018-07-02 MED ORDER — MORPHINE SULFATE (PF) 2 MG/ML IV SOLN
2.0000 mg | Freq: Once | INTRAVENOUS | Status: AC
  Administered 2018-07-02: 2 mg via INTRAVENOUS
  Filled 2018-07-02: qty 1

## 2018-07-02 MED ORDER — ZOLPIDEM TARTRATE 5 MG PO TABS
5.0000 mg | ORAL_TABLET | Freq: Every evening | ORAL | Status: DC | PRN
  Administered 2018-07-02 – 2018-07-03 (×2): 5 mg via ORAL
  Filled 2018-07-02 (×2): qty 1

## 2018-07-02 MED ORDER — CEFAZOLIN SODIUM-DEXTROSE 1-4 GM/50ML-% IV SOLN
1.0000 g | Freq: Once | INTRAVENOUS | Status: AC
  Administered 2018-07-02: 1 g via INTRAVENOUS
  Filled 2018-07-02: qty 50

## 2018-07-02 MED ORDER — IOPAMIDOL (ISOVUE-300) INJECTION 61%
100.0000 mL | Freq: Once | INTRAVENOUS | Status: AC | PRN
  Administered 2018-07-02: 100 mL via INTRAVENOUS

## 2018-07-02 MED ORDER — IBUPROFEN 400 MG PO TABS
600.0000 mg | ORAL_TABLET | Freq: Four times a day (QID) | ORAL | Status: DC | PRN
  Administered 2018-07-02 – 2018-07-04 (×4): 600 mg via ORAL
  Filled 2018-07-02 (×4): qty 2

## 2018-07-02 MED ORDER — DOCUSATE SODIUM 100 MG PO CAPS
100.0000 mg | ORAL_CAPSULE | Freq: Two times a day (BID) | ORAL | Status: DC
  Administered 2018-07-02 – 2018-07-03 (×4): 100 mg via ORAL
  Filled 2018-07-02 (×5): qty 1

## 2018-07-02 MED ORDER — ONDANSETRON HCL 4 MG PO TABS: 4.0000 mg | ORAL_TABLET | Freq: Four times a day (QID) | ORAL | Status: DC | PRN

## 2018-07-02 MED ORDER — DEXAMETHASONE SODIUM PHOSPHATE 10 MG/ML IJ SOLN
10.0000 mg | Freq: Once | INTRAMUSCULAR | Status: AC
  Administered 2018-07-02: 10 mg via INTRAVENOUS
  Filled 2018-07-02: qty 1

## 2018-07-02 MED ORDER — METHYLPREDNISOLONE SODIUM SUCC 125 MG IJ SOLR
125.0000 mg | Freq: Once | INTRAMUSCULAR | Status: AC
  Administered 2018-07-02: 125 mg via INTRAVENOUS
  Filled 2018-07-02: qty 2

## 2018-07-02 MED ORDER — ADULT MULTIVITAMIN W/MINERALS CH
1.0000 | ORAL_TABLET | Freq: Every day | ORAL | Status: DC
  Administered 2018-07-02 – 2018-07-04 (×3): 1 via ORAL
  Filled 2018-07-02 (×3): qty 1

## 2018-07-02 MED ORDER — ONDANSETRON HCL 4 MG/2ML IJ SOLN
4.0000 mg | Freq: Once | INTRAMUSCULAR | Status: AC
  Administered 2018-07-02: 4 mg via INTRAVENOUS
  Filled 2018-07-02: qty 2

## 2018-07-02 MED ORDER — CLINDAMYCIN PHOSPHATE 900 MG/50ML IV SOLN
900.0000 mg | Freq: Once | INTRAVENOUS | Status: AC
  Administered 2018-07-02: 900 mg via INTRAVENOUS
  Filled 2018-07-02: qty 50

## 2018-07-02 MED ORDER — CYCLOBENZAPRINE HCL 10 MG PO TABS
10.0000 mg | ORAL_TABLET | Freq: Three times a day (TID) | ORAL | Status: DC | PRN
  Administered 2018-07-02 – 2018-07-04 (×2): 10 mg via ORAL
  Filled 2018-07-02 (×2): qty 1

## 2018-07-02 MED ORDER — SODIUM CHLORIDE 0.9 % IV SOLN
3.0000 g | Freq: Four times a day (QID) | INTRAVENOUS | Status: DC
  Administered 2018-07-02 – 2018-07-04 (×9): 3 g via INTRAVENOUS
  Filled 2018-07-02 (×14): qty 3

## 2018-07-02 MED ORDER — ONDANSETRON HCL 4 MG/2ML IJ SOLN: 4.0000 mg | Freq: Four times a day (QID) | INTRAMUSCULAR | Status: DC | PRN

## 2018-07-02 MED ORDER — IPRATROPIUM-ALBUTEROL 0.5-2.5 (3) MG/3ML IN SOLN
3.0000 mL | Freq: Once | RESPIRATORY_TRACT | Status: AC
  Administered 2018-07-02: 3 mL via RESPIRATORY_TRACT
  Filled 2018-07-02: qty 3

## 2018-07-02 MED ORDER — LACTATED RINGERS IV SOLN
INTRAVENOUS | Status: DC
  Administered 2018-07-02 – 2018-07-04 (×6): via INTRAVENOUS

## 2018-07-02 MED ORDER — ACETAMINOPHEN 325 MG PO TABS
650.0000 mg | ORAL_TABLET | Freq: Four times a day (QID) | ORAL | Status: DC | PRN
  Administered 2018-07-02 – 2018-07-04 (×5): 650 mg via ORAL
  Filled 2018-07-02 (×5): qty 2

## 2018-07-02 MED ORDER — DEXAMETHASONE SODIUM PHOSPHATE 4 MG/ML IJ SOLN
4.0000 mg | Freq: Two times a day (BID) | INTRAMUSCULAR | Status: DC
  Administered 2018-07-03 – 2018-07-04 (×3): 4 mg via INTRAVENOUS
  Filled 2018-07-02 (×4): qty 1

## 2018-07-02 MED ORDER — ACETAMINOPHEN 650 MG RE SUPP: 650.0000 mg | Freq: Four times a day (QID) | RECTAL | Status: DC | PRN

## 2018-07-02 MED ORDER — ENSURE ENLIVE PO LIQD
237.0000 mL | Freq: Two times a day (BID) | ORAL | Status: DC
  Administered 2018-07-02 – 2018-07-03 (×2): 237 mL via ORAL

## 2018-07-02 NOTE — Consult Note (Signed)
Deanna Ramsey, Deanna Ramsey [id number] 1966/05/24 Hillary Bow, MD  Reason for Consult: Evaluate for nasopharyngeal abscess  HPI: Patient is a 52 year old white female who had some sore throat starting 4 days ago but otherwise has had mild sore throat feeling like there is some swelling in her throat.  She still been eating but presented to the ER for evaluation of her throat.  A strep test was positive.  She has some exudate.  She had a CT scan that showed evidence of lucencies in the nasopharynx with suggestion of possible abscess.  Assessment is made of her.  Allergies:  Allergies  Allergen Reactions  . Sulfa Antibiotics Nausea And Vomiting    ROS: Review of systems normal other than 12 systems except per HPI.  PMH:  Past Medical History:  Diagnosis Date  . Arthritis   . Cellulitis   . Depression   . Hypertension     FH:  Family History  Problem Relation Age of Onset  . COPD Other   . COPD Mother   . Hypertension Mother   . COPD Father     SH:  Social History   Socioeconomic History  . Marital status: Single    Spouse name: Not on file  . Number of children: Not on file  . Years of education: Not on file  . Highest education level: Not on file  Occupational History  . Not on file  Social Needs  . Financial resource strain: Not on file  . Food insecurity:    Worry: Not on file    Inability: Not on file  . Transportation needs:    Medical: Not on file    Non-medical: Not on file  Tobacco Use  . Smoking status: Current Every Day Smoker    Packs/day: 1.00    Types: Cigarettes  . Smokeless tobacco: Never Used  Substance and Sexual Activity  . Alcohol use: No  . Drug use: No  . Sexual activity: Not on file  Lifestyle  . Physical activity:    Days per week: Not on file    Minutes per session: Not on file  . Stress: Not on file  Relationships  . Social connections:    Talks on phone: Not on file    Gets together: Not on file    Attends religious service: Not on file     Active member of club or organization: Not on file    Attends meetings of clubs or organizations: Not on file    Relationship status: Not on file  . Intimate partner violence:    Fear of current or ex partner: Not on file    Emotionally abused: Not on file    Physically abused: Not on file    Forced sexual activity: Not on file  Other Topics Concern  . Not on file  Social History Narrative  . Not on file    PSH:  Past Surgical History:  Procedure Laterality Date  . arm surgery    . CLEFT LIP REPAIR    . kidney stones    . lymphnode neck      Physical  Exam: Thin white female with CN 2-12 grossly intact and symmetric. Oral cavity shows some fullness at the palate and its a little red, but no ulcerations or exudate right now.  Lips, gums normal with no masses or lesions. Skin warm and dry. Nasal cavity without polyps or purulence. External nose and ears without masses or lesions.  Neck supple with no masses or  lesions. No lymphadenopathy palpated. Thyroid normal with no masses.   A/P: I reviewed her CT in detail which does show lucencies in the nasopharynx with some swelling around it but currently she has no pain and is not toxic.  I wonder if this may be HIV related and an HIV test has been ordered.  This could be lymphoepithelial cysts that now has a secondary strep infection in her throat.  Because she is not toxic and can swallow I think using IV antibiotics for now to watch her and see if this resolves on its own would be appropriate.  She can  start an oral diet today and I will check her again tonight to make sure she continues to improve.  We will see if the HIV results are back yet at that time.   Elon Alas Irma Roulhac 07/02/2018 6:58 AM

## 2018-07-02 NOTE — ED Notes (Signed)
Pt to ct at this time.

## 2018-07-02 NOTE — Progress Notes (Signed)
Initial Nutrition Assessment  DOCUMENTATION CODES:   Severe malnutrition in context of chronic illness  INTERVENTION:   Ensure Enlive po BID, each supplement provides 350 kcal and 20 grams of protein  MVI daily  Magic cup TID with meals, each supplement provides 290 kcal and 9 grams of protein  NUTRITION DIAGNOSIS:   Severe Malnutrition related to chronic illness(COPD) as evidenced by severe fat depletion, severe muscle depletion.  GOAL:   Patient will meet greater than or equal to 90% of their needs  MONITOR:   PO intake, Supplement acceptance, Labs, Weight trends, Skin, I & O's  REASON FOR ASSESSMENT:   Other (Comment)(low BMI)    ASSESSMENT:   52 y/o female with h/o COPD admitted for strept throat and possible abscess    Met with pt in room today. RD familiar with this pt from previous admits. Pt is generally a good eater at baseline. Pt reports poor appetite and oral intake for 3 days pta r/t neck pain. Pt reports her appetite is good today and she is eating 100% of meals. Pt likes Ensure and drinks these at home but not regullarly. Per chart, pt is weight stable. RD will add supplements and MVI to help pt meet her estimated needs.     Medications reviewed and include: colace, nicotine, unasyn, LRS @125ml /hr  Labs reviewed: K 3.3(L), BUN 25(H) Wbc- 18.0(H)  NUTRITION - FOCUSED PHYSICAL EXAM:    Most Recent Value  Orbital Region  Mild depletion  Upper Arm Region  Severe depletion  Thoracic and Lumbar Region  Severe depletion  Buccal Region  Moderate depletion  Temple Region  Mild depletion  Clavicle Bone Region  Severe depletion  Clavicle and Acromion Bone Region  Severe depletion  Scapular Bone Region  Severe depletion  Dorsal Hand  Severe depletion  Patellar Region  Severe depletion  Anterior Thigh Region  Severe depletion  Posterior Calf Region  Severe depletion  Edema (RD Assessment)  None  Hair  Reviewed  Eyes  Reviewed  Mouth  Reviewed  Skin   Reviewed  Nails  Reviewed     Diet Order:   Diet Order           Diet regular Room service appropriate? Yes; Fluid consistency: Thin  Diet effective now         EDUCATION NEEDS:   Education needs have been addressed  Skin:  Skin Assessment: Reviewed RN Assessment  Last BM:  7/24  Height:   Ht Readings from Last 1 Encounters:  07/02/18 5' 5"  (1.651 m)    Weight:   Wt Readings from Last 1 Encounters:  07/02/18 101 lb 1.6 oz (45.9 kg)    Ideal Body Weight:  56.8 kg  BMI:  Body mass index is 16.82 kg/m.  Estimated Nutritional Needs:   Kcal:  1400-1600kcal/day   Protein:  69-78g/day   Fluid:  >1.4L/day   Koleen Distance MS, RD, LDN Pager #- [phone] Office#- [phone] After Hours Pager: [phone]

## 2018-07-02 NOTE — Progress Notes (Signed)
6:10 pm 07/02/2018  S: Patient complains only of neck pain when she is leaning forward or putting her head backwards.  She is swallowing okay fine.  The neck pain is been there for about 36 hours on and off.  She is occasionally spitting out some yellowish mucus.  She is breathing okay through her nose and not feeling short of breath at all. O: Afebrile and vital signs stable.  Intraorally her palate shows little redness and irritation mucosa over the uvula and lower soft palate but there is no swelling here in the posterior pharynx looks clear.  I do not see any redness or purulence there.  The tongue is mobile and moves well.  Her neck has some enlarged submandibular glands but no other palpable lymph nodes on either side of the neck.  She is had an elevated white blood cell count but her blood cultures are negative so far.  Her HIV testing is negative. Her CT scan showed what look like abscesses in the adenoid tissue and just some swelling going down the posterior pharyngeal wall.  Assessment: She is not clinically toxic but certainly could have some infection in her nasopharynx and extending down her posterior retropharynx giving her the neck pain.  She is on appropriate treatment with Augmentin and I do not think she needs any surgery at this point.  She is spitting out purulent mucus which looks like things are spontaneously drained and she should be getting better.  She only had one dose of Decadron previously and needs to be on a steroid taper currently.  She still needs 24 to 48 hours of IV antibiotics to make sure she is improving.  If she should spike a fever or worsen then she would need to be taken to the operating room to evaluate these areas and potentially drain an abscess if we could find it.  I discussed this with the patient and she does not want any surgery if at all possible and is much happier treating this medically to see if we can get it resolved.  Plan: Will put her on a  prednisone taper and have her continue to spit everything out from her throat.  She can use ibuprofen as needed.  I will sign out to Dr. Flonnie Overman will be covering ENT for the upcoming weekend beginning tonight.

## 2018-07-02 NOTE — Progress Notes (Signed)
CODE SEPSIS - PHARMACY COMMUNICATION  **Broad Spectrum Antibiotics should be administered within 1 hour of Sepsis diagnosis**  Time Code Sepsis Called/Page Received: 07/25 0339  Antibiotics Ordered: 07/25 0339  Time of 1st antibiotic administration: 07/25 0354  Additional action taken by pharmacy:   If necessary, Name of Provider/Nurse Contacted:     Eloise Harman ,PharmD Clinical Pharmacist  07/02/2018  3:56 AM

## 2018-07-02 NOTE — ED Triage Notes (Addendum)
Patient ambulatory to triage with steady gait, without difficulty or distress noted; pt reports here earlier but left; st prod cough green sputum and sinus congestion , HA nd neck spasms with fever since Sunday

## 2018-07-02 NOTE — H&P (Signed)
Deanna Ramsey is an 52 y.o. female.   Chief Complaint: Neck pain HPI: The patient with past medical history of hypertension and recent pneumonia and remote history of epiglottitis presents to the emergency department complaining of neck pain for the last 4 days.  She also complains of headache and cough.  Due to significant swelling in her neck the emergency department obtained a CT scan which showed oropharyngeal abscesses and swelling of airway tissue which prompted the emergency department staff to call the hospitalist service for admission.  Past Medical History:  Diagnosis Date  . Arthritis   . Cellulitis   . Depression   . Hypertension     Past Surgical History:  Procedure Laterality Date  . arm surgery    . CLEFT LIP REPAIR    . kidney stones    . lymphnode neck      Family History  Problem Relation Age of Onset  . COPD Other   . COPD Mother   . Hypertension Mother   . COPD Father    Social History:  reports that she has been smoking cigarettes.  She has been smoking about 1.00 pack per day. She has never used smokeless tobacco. She reports that she does not drink alcohol or use drugs.  Allergies:  Allergies  Allergen Reactions  . Sulfa Antibiotics Nausea And Vomiting    Medications Prior to Admission  Medication Sig Dispense Refill  . albuterol (PROVENTIL HFA;VENTOLIN HFA) 108 (90 Base) MCG/ACT inhaler Inhale 2 puffs into the lungs every 6 (six) hours as needed for wheezing or shortness of breath. 1 Inhaler 2  . guaiFENesin (MUCINEX) 600 MG 12 hr tablet Take 600 mg by mouth 2 (two) times daily.    Marland Kitchen lisinopril (PRINIVIL,ZESTRIL) 20 MG tablet Take 20 mg by mouth daily.    . nicotine (NICODERM CQ - DOSED IN MG/24 HOURS) 21 mg/24hr patch Place 1 patch (21 mg total) onto the skin daily. 28 patch 0  . feeding supplement, ENSURE ENLIVE, (ENSURE ENLIVE) LIQD Take 237 mLs by mouth 2 (two) times daily between meals. 237 mL 12  . levofloxacin (LEVAQUIN) 750 MG tablet Take 1  tablet (750 mg total) by mouth daily. (Patient not taking: Reported on 07/02/2018) 5 tablet 0    Results for orders placed or performed during the hospital encounter of 07/02/18 (from the past 48 hour(s))  Group A Strep by PCR     Status: Abnormal   Collection Time: 07/02/18  1:51 AM  Result Value Ref Range   Group A Strep by PCR DETECTED (A) NOT DETECTED    Comment: Performed at [facility], [address] [place], [place] [address]  CBC     Status: Abnormal   Collection Time: 07/02/18  1:51 AM  Result Value Ref Range   WBC 18.0 (H) 3.6 - 11.0 K/uL   RBC 3.64 (L) 3.80 - 5.20 MIL/uL   Hemoglobin 11.7 (L) 12.0 - 16.0 g/dL   HCT 34.3 (L) 35.0 - 47.0 %   MCV 94.3 80.0 - 100.0 fL   MCH 32.2 26.0 - 34.0 pg   MCHC 34.2 32.0 - 36.0 g/dL   RDW 13.6 11.5 - 14.5 %   Platelets 231 150 - 440 K/uL    Comment: Performed at [facility], [address], [place], [place] [address]  Comprehensive metabolic panel     Status: Abnormal   Collection Time: 07/02/18  1:51 AM  Result Value Ref Range   Sodium 136 135 -  145 mmol/L   Potassium 3.3 (L) 3.5 - 5.1 mmol/L   Chloride 105 98 - 111 mmol/L   CO2 22 22 - 32 mmol/L   Glucose, Bld 124 (H) 70 - 99 mg/dL   BUN 25 (H) 6 - 20 mg/dL   Creatinine, Ser 0.91 0.44 - 1.00 mg/dL   Calcium 8.6 (L) 8.9 - 10.3 mg/dL   Total Protein 6.9 6.5 - 8.1 g/dL   Albumin 3.1 (L) 3.5 - 5.0 g/dL   AST 23 15 - 41 U/L   ALT 17 0 - 44 U/L   Alkaline Phosphatase 97 38 - 126 U/L   Total Bilirubin 0.5 0.3 - 1.2 mg/dL   GFR calc non Af Amer >60 >60 mL/min   GFR calc Af Amer >60 >60 mL/min    Comment: (NOTE) The eGFR has been calculated using the CKD EPI equation. This calculation has not been validated in all clinical situations. eGFR's persistently <60 mL/min signify possible Chronic Kidney Disease.    Anion gap 9 5 - 15    Comment: Performed at [facility], [address] [place], [place] [address]  Blood Culture (routine x 2)      Status: None (Preliminary result)   Collection Time: 07/02/18  4:10 AM  Result Value Ref Range   Specimen Description BLOOD BLOOD LEFT FOREARM    Special Requests      BOTTLES DRAWN AEROBIC AND ANAEROBIC Blood Culture results may not be optimal due to an inadequate volume of blood received in culture bottles   Culture      NO GROWTH < 12 HOURS Performed at [facility], [address] [place], [place] [address]    Report Status PENDING   Blood Culture (routine x 2)     Status: None (Preliminary result)   Collection Time: 07/02/18  4:10 AM  Result Value Ref Range   Specimen Description BLOOD BLOOD RIGHT FOREARM    Special Requests      BOTTLES DRAWN AEROBIC AND ANAEROBIC Blood Culture adequate volume   Culture      NO GROWTH < 12 HOURS Performed at [facility], [address], [place], [place] [address]    Report Status PENDING   Lactic acid, plasma     Status: Abnormal   Collection Time: 07/02/18  4:16 AM  Result Value Ref Range   Lactic Acid, Venous 0.4 (L) 0.5 - 1.9 mmol/L    Comment: Performed at [facility], [address] [place], [place] [address]   Dg Chest 2 View  Result Date: 07/02/2018 CLINICAL DATA:  Productive cough fever EXAM: CHEST - 2 VIEW COMPARISON:  05/27/2018, CT chest 02/17/2018 FINDINGS: Hyperinflation with bronchitic changes at the lower lobes. No focal airspace disease or effusion. Stable cardiomediastinal silhouette. No pneumothorax. Mild aortic atherosclerosis. IMPRESSION: No active cardiopulmonary disease. Hyperinflation with bronchitic changes. Electronically Signed   By: Donavan Foil M.D.   On: 07/02/2018 01:16   Ct Soft Tissue Neck W Contrast  Result Date: 07/02/2018 CLINICAL DATA:  Sore throat and stridor.  Cough and headache.  Fever EXAM: CT NECK WITH CONTRAST TECHNIQUE: Multidetector CT imaging of the neck was performed using the standard protocol following the bolus administration of intravenous contrast. CONTRAST:   14m ISOVUE-300 IOPAMIDOL (ISOVUE-300) INJECTION 61% COMPARISON:  None. FINDINGS: PHARYNX AND LARYNX: --Nasopharynx: The adenoid tonsils are enlarged with multiple internal fluid collections, the largest of which measures 2.1 x 1.1 cm. --Oral cavity and oropharynx: Palatine tonsils are only mildly enlarged,  but there is marked enlargement of the lingual tonsils, filling the vallecula. --Hypopharynx: There is narrowing of the piriform sinuses and hypopharyngeal airway. Epiglottis is normal. --Larynx: There is severe edema of the aryepiglottic folds, arytenoid muscles and the inferior pharyngeal constrictor muscles with moderate mass effect on the supraglottic airway. --Retropharyngeal space: Retropharyngeal effusion extends from the level of the oropharynx to the level of the vocal cords. No mediastinal extension. SALIVARY GLANDS: --Parotid: No mass lesion or inflammation. No sialolithiasis or ductal dilatation. --Submandibular: Symmetric without inflammation. No sialolithiasis or ductal dilatation. --Sublingual: Normal. No ranula or other visible lesion of the base of tongue and floor of mouth. THYROID: Normal. LYMPH NODES: Numerous bilateral hyperattenuating and large lymph nodes at cervical levels 2, 3 and 5. VASCULAR: Major cervical vessels are patent. LIMITED INTRACRANIAL: Normal. VISUALIZED ORBITS: Normal. MASTOIDS AND VISUALIZED PARANASAL SINUSES: No fluid levels or advanced mucosal thickening. No mastoid effusion. SKELETON: No bony spinal canal stenosis. No lytic or blastic lesions. OTHER: None. IMPRESSION: 1. Severe tonsillopharyngitis with multiloculated adenoid peritonsillar abscess, with largest fluid pocket measuring up to 2.1 x 1.1 cm. The lingual and palatine tonsils are also moderately enlarged. 2. Severe edema of the aryepiglottic folds and the arytenoid and pharyngeal constrictor muscles with associated moderate-to-severe narrowing of the supraglottic airway. 3. Retropharyngeal effusion extending  from the oropharynx to the level of the true vocal cords. No mediastinal extension. 4. Multilevel reactive cervical lymphadenopathy. These results were called by telephone at the time of interpretation on 07/02/2018 at 3:34 am to Dr. Marjean Donna , who verbally acknowledged these results. Electronically Signed   By: Ulyses Jarred M.D.   On: 07/02/2018 03:36   Ct Chest W Contrast  Result Date: 07/02/2018 CLINICAL DATA:  Green sputum and sinus congestion EXAM: CT CHEST WITH CONTRAST TECHNIQUE: Multidetector CT imaging of the chest was performed during intravenous contrast administration. CONTRAST:  178m ISOVUE-300 IOPAMIDOL (ISOVUE-300) INJECTION 61% COMPARISON:  Chest x-ray 07/02/2018, CT chest 02/17/2018 FINDINGS: Cardiovascular: Nonaneurysmal aorta. Mild aortic atherosclerosis. Normal heart size. No pericardial effusion Mediastinum/Nodes: Midline trachea. No thyroid mass. No significant adenopathy. Esophagus within normal limits Lungs/Pleura: Mild emphysema. Stable scarring at the apex of the right upper lobe. No acute consolidation, pleural effusion or pneumothorax. Upper Abdomen: No acute abnormality. Cyst in the upper pole of the left kidney Musculoskeletal: Old left rib fractures. No acute or suspicious abnormality. IMPRESSION: 1. No acute pulmonary infiltrate is visualized. Mild scarring in the apical portion of the right upper lobe 2. Mild emphysema Aortic Atherosclerosis (ICD10-I70.0) and Emphysema (ICD10-J43.9). Electronically Signed   By: KDonavan FoilM.D.   On: 07/02/2018 03:17    Review of Systems  Constitutional: Negative for chills and fever.  HENT: Negative for sore throat and tinnitus.   Eyes: Negative for blurred vision and redness.  Respiratory: Negative for cough and shortness of breath.   Cardiovascular: Negative for chest pain, palpitations, orthopnea and PND.  Gastrointestinal: Negative for abdominal pain, diarrhea, nausea and vomiting.  Genitourinary: Negative for dysuria,  frequency and urgency.  Musculoskeletal: Negative for joint pain and myalgias.  Skin: Negative for rash.       No lesions  Neurological: Positive for headaches. Negative for speech change, focal weakness and weakness.  Endo/Heme/Allergies: Does not bruise/bleed easily.       No temperature intolerance  Psychiatric/Behavioral: Negative for depression and suicidal ideas.    Blood pressure 137/72, pulse 75, temperature 98.8 F (37.1 C), temperature source Oral, resp. rate 20, height 5' 5" (1.651 m), weight 45.4  kg (100 lb), last menstrual period 02/28/2012, SpO2 100 %. Physical Exam  Vitals reviewed. Constitutional: She is oriented to person, place, and time. She appears well-developed. She appears cachectic.  HENT:  Head: Normocephalic and atraumatic.  Eyes: Pupils are equal, round, and reactive to light. Conjunctivae and EOM are normal.  Neck: Normal range of motion. Neck supple. No JVD present. No tracheal deviation present. No thyromegaly present.  Cardiovascular: Normal rate, regular rhythm and normal heart sounds. Exam reveals no gallop and no friction rub.  No murmur heard. Respiratory: Effort normal and breath sounds normal.  GI: Soft. Bowel sounds are normal. She exhibits no distension. There is no tenderness.  Genitourinary:  Genitourinary Comments: Deferred  Lymphadenopathy:    She has cervical adenopathy.  Neurological: She is alert and oriented to person, place, and time. No cranial nerve deficit. She exhibits normal muscle tone.  Skin: Skin is warm and dry.  Psychiatric: She has a normal mood and affect. Judgment and thought content normal.     Assessment/Plan This is a 52 year old female admitted for pharyngeal abscesses. 1.  Pharyngeal abscesses: Multiple abscesses with edema of constrictor muscles as well as the supraglottic airway. There is associated multilevel cervical lymphadenopathy.  There is retropharyngeal effusion without extension into the mediastinum.   Started on Unasyn.  The patient has a history of tracheostomy following epiglottitis.  Consult ENT.  Check for immunosuppression. No signs or symptoms of sepsis at this time. 2.  Hypertension: Controlled; continue lisinopril 3.  Chronic bronchitis: Associated with smoking.  The patient also recently had multifocal pneumonia which is also supportive and concerning for immunosuppression. 4.  Tobacco abuse: Ongoing; nicotine patch 5.  DVT prophylaxis: SCDs 6.  GI prophylaxis: None The patient is a full code.  Time spent on admission orders and patient care approximately 45 minutes  Harrie Foreman, MD 07/02/2018, 6:43 AM

## 2018-07-02 NOTE — Progress Notes (Signed)
Pharmacy Antibiotic Note  Deanna Ramsey is a 52 y.o. female admitted on 07/02/2018 with oral abscesses.  Pharmacy has been consulted for Unasyn dosing.  Plan: Unasyn 3 grams q 6 hours ordered  Height: 5\' 5"  (165.1 cm) Weight: 100 lb (45.4 kg) IBW/kg (Calculated) : 57  Temp (24hrs), Avg:100.1 F (37.8 C), Min:100 F (37.8 C), Max:100.1 F (37.8 C)  Recent Labs  Lab 07/02/18 0151  WBC 18.0*  CREATININE 0.91    Estimated Creatinine Clearance: 52.4 mL/min (by C-G formula based on SCr of 0.91 mg/dL).    Allergies  Allergen Reactions  . Sulfa Antibiotics Nausea And Vomiting    Antimicrobials this admission: Clindamycin and ancef x1 7/25; Unasyn 7/25  >>    >>   Dose adjustments this admission:   Microbiology results: 7/25 BCx: pending 7/25 Group A strep (+)  Thank you for allowing pharmacy to be a part of this patient's care.  Arieliz Latino S 07/02/2018 4:41 AM

## 2018-07-02 NOTE — ED Notes (Signed)
Pt transferred to room 223 at this time .

## 2018-07-03 LAB — CBC WITH DIFFERENTIAL/PLATELET
BASOS PCT: 1 %
Basophils Absolute: 0.1 10*3/uL (ref 0–0.1)
EOS ABS: 0 10*3/uL (ref 0–0.7)
Eosinophils Relative: 0 %
HEMATOCRIT: 32.9 % — AB (ref 35.0–47.0)
Hemoglobin: 11 g/dL — ABNORMAL LOW (ref 12.0–16.0)
Lymphocytes Relative: 5 %
Lymphs Abs: 1 10*3/uL (ref 1.0–3.6)
MCH: 31.3 pg (ref 26.0–34.0)
MCHC: 33.4 g/dL (ref 32.0–36.0)
MCV: 93.7 fL (ref 80.0–100.0)
MONO ABS: 1.1 10*3/uL — AB (ref 0.2–0.9)
MONOS PCT: 5 %
NEUTROS ABS: 20.6 10*3/uL — AB (ref 1.4–6.5)
Neutrophils Relative %: 89 %
Platelets: 267 10*3/uL (ref 150–440)
RBC: 3.51 MIL/uL — ABNORMAL LOW (ref 3.80–5.20)
RDW: 13.9 % (ref 11.5–14.5)
WBC: 22.9 10*3/uL — ABNORMAL HIGH (ref 3.6–11.0)

## 2018-07-03 LAB — BASIC METABOLIC PANEL
Anion gap: 6 (ref 5–15)
BUN: 21 mg/dL — ABNORMAL HIGH (ref 6–20)
CALCIUM: 8.6 mg/dL — AB (ref 8.9–10.3)
CO2: 27 mmol/L (ref 22–32)
CREATININE: 0.65 mg/dL (ref 0.44–1.00)
Chloride: 106 mmol/L (ref 98–111)
GFR calc Af Amer: >60 mL/min (ref >60)
GFR calc non Af Amer: >60 mL/min (ref >60)
GLUCOSE: 159 mg/dL — AB (ref 70–99)
Potassium: 3.7 mmol/L (ref 3.5–5.1)
Sodium: 139 mmol/L (ref 135–145)

## 2018-07-03 LAB — HIV ANTIBODY (ROUTINE TESTING W REFLEX): HIV Screen 4th Generation wRfx: NONREACTIVE

## 2018-07-03 MED ORDER — FLUCONAZOLE 100 MG PO TABS
100.0000 mg | ORAL_TABLET | Freq: Every day | ORAL | Status: DC
  Administered 2018-07-04 (×2): 100 mg via ORAL
  Filled 2018-07-03 (×3): qty 1

## 2018-07-03 NOTE — Progress Notes (Signed)
[place] [facility] [place] NAME: Deanna Ramsey    MR#:  [id number]  DATE OF BIRTH:  [birth year]  SUBJECTIVE:  CHIEF COMPLAINT:   Chief Complaint  Patient presents with  . Headache  . Nasal Congestion   Patient able to tolerate food well.  Minimal dysphagia and neck pain.  Afebrile today.  REVIEW OF SYSTEMS:    Review of Systems  Constitutional: Positive for chills and malaise/fatigue. Negative for fever and weight loss.  HENT: Negative for hearing loss and nosebleeds.   Eyes: Negative for blurred vision, double vision and pain.  Respiratory: Positive for cough. Negative for hemoptysis, sputum production, shortness of breath and wheezing.   Cardiovascular: Negative for chest pain, palpitations, orthopnea and leg swelling.  Gastrointestinal: Negative for abdominal pain, constipation, diarrhea, nausea and vomiting.  Genitourinary: Negative for dysuria and hematuria.  Musculoskeletal: Negative for back pain, falls and myalgias.  Skin: Negative for rash.  Neurological: Positive for headaches. Negative for dizziness, tremors, sensory change, speech change, focal weakness and seizures.  Endo/Heme/Allergies: Does not bruise/bleed easily.  Psychiatric/Behavioral: Negative for depression and memory loss. The patient is not nervous/anxious.     DRUG ALLERGIES:   Allergies  Allergen Reactions  . Sulfa Antibiotics Nausea And Vomiting    VITALS:  Blood pressure (!) 167/113, pulse (!) 102, temperature (!) 96.5 F (35.8 C), temperature source Axillary, resp. rate 16, height 5\' 5"  (1.651 m), weight 47.2 kg (104 lb), last menstrual period 02/28/2012, SpO2 98 %.  PHYSICAL EXAMINATION:   Physical Exam  GENERAL:  52 y.o.-year-old patient lying in the bed with no acute distress.  EYES: Pupils equal, round, reactive to light and accommodation. No scleral icterus. Extraocular muscles intact.  HEENT: Head atraumatic, normocephalic. Oropharynx and nasopharynx  clear.  NECK:  Supple, no jugular venous distention. No thyroid enlargement, no tenderness.  LUNGS: Normal breath sounds bilaterally, no wheezing, rales, rhonchi. No use of accessory muscles of respiration.  CARDIOVASCULAR: S1, S2 normal. No murmurs, rubs, or gallops.  ABDOMEN: Soft, nontender, nondistended. Bowel sounds present. No organomegaly or mass.  EXTREMITIES: No cyanosis, clubbing or edema b/l.    NEUROLOGIC: Cranial nerves II through XII are intact. No focal Motor or sensory deficits b/l.   PSYCHIATRIC: The patient is alert and oriented x 3.  SKIN: No obvious rash, lesion, or ulcer.   LABORATORY PANEL:   CBC Recent Labs  Lab 07/03/18 0446  WBC 22.9*  HGB 11.0*  HCT 32.9*  PLT 267   ------------------------------------------------------------------------------------------------------------------ Chemistries  Recent Labs  Lab 07/02/18 0151 07/03/18 0446  NA 136 139  K 3.3* 3.7  CL 105 106  CO2 22 27  GLUCOSE 124* 159*  BUN 25* 21*  CREATININE 0.91 0.65  CALCIUM 8.6* 8.6*  AST 23  --   ALT 17  --   ALKPHOS 97  --   BILITOT 0.5  --    ------------------------------------------------------------------------------------------------------------------  Cardiac Enzymes No results for input(s): TROPONINI in the last 168 hours. ------------------------------------------------------------------------------------------------------------------  RADIOLOGY:  Dg Chest 2 View  Result Date: 07/02/2018 CLINICAL DATA:  Productive cough fever EXAM: CHEST - 2 VIEW COMPARISON:  05/27/2018, CT chest 02/17/2018 FINDINGS: Hyperinflation with bronchitic changes at the lower lobes. No focal airspace disease or effusion. Stable cardiomediastinal silhouette. No pneumothorax. Mild aortic atherosclerosis. IMPRESSION: No active cardiopulmonary disease. Hyperinflation with bronchitic changes. Electronically Signed   By: Donavan Foil M.D.   On: 07/02/2018 01:16   Ct Soft Tissue Neck W  Contrast  Result  Date: 07/02/2018 CLINICAL DATA:  Sore throat and stridor.  Cough and headache.  Fever EXAM: CT NECK WITH CONTRAST TECHNIQUE: Multidetector CT imaging of the neck was performed using the standard protocol following the bolus administration of intravenous contrast. CONTRAST:  162mL ISOVUE-300 IOPAMIDOL (ISOVUE-300) INJECTION 61% COMPARISON:  None. FINDINGS: PHARYNX AND LARYNX: --Nasopharynx: The adenoid tonsils are enlarged with multiple internal fluid collections, the largest of which measures 2.1 x 1.1 cm. --Oral cavity and oropharynx: Palatine tonsils are only mildly enlarged, but there is marked enlargement of the lingual tonsils, filling the vallecula. --Hypopharynx: There is narrowing of the piriform sinuses and hypopharyngeal airway. Epiglottis is normal. --Larynx: There is severe edema of the aryepiglottic folds, arytenoid muscles and the inferior pharyngeal constrictor muscles with moderate mass effect on the supraglottic airway. --Retropharyngeal space: Retropharyngeal effusion extends from the level of the oropharynx to the level of the vocal cords. No mediastinal extension. SALIVARY GLANDS: --Parotid: No mass lesion or inflammation. No sialolithiasis or ductal dilatation. --Submandibular: Symmetric without inflammation. No sialolithiasis or ductal dilatation. --Sublingual: Normal. No ranula or other visible lesion of the base of tongue and floor of mouth. THYROID: Normal. LYMPH NODES: Numerous bilateral hyperattenuating and large lymph nodes at cervical levels 2, 3 and 5. VASCULAR: Major cervical vessels are patent. LIMITED INTRACRANIAL: Normal. VISUALIZED ORBITS: Normal. MASTOIDS AND VISUALIZED PARANASAL SINUSES: No fluid levels or advanced mucosal thickening. No mastoid effusion. SKELETON: No bony spinal canal stenosis. No lytic or blastic lesions. OTHER: None. IMPRESSION: 1. Severe tonsillopharyngitis with multiloculated adenoid peritonsillar abscess, with largest fluid pocket  measuring up to 2.1 x 1.1 cm. The lingual and palatine tonsils are also moderately enlarged. 2. Severe edema of the aryepiglottic folds and the arytenoid and pharyngeal constrictor muscles with associated moderate-to-severe narrowing of the supraglottic airway. 3. Retropharyngeal effusion extending from the oropharynx to the level of the true vocal cords. No mediastinal extension. 4. Multilevel reactive cervical lymphadenopathy. These results were called by telephone at the time of interpretation on 07/02/2018 at 3:34 am to Dr. Marjean Donna , who verbally acknowledged these results. Electronically Signed   By: Ulyses Jarred M.D.   On: 07/02/2018 03:36   Ct Chest W Contrast  Result Date: 07/02/2018 CLINICAL DATA:  Green sputum and sinus congestion EXAM: CT CHEST WITH CONTRAST TECHNIQUE: Multidetector CT imaging of the chest was performed during intravenous contrast administration. CONTRAST:  121mL ISOVUE-300 IOPAMIDOL (ISOVUE-300) INJECTION 61% COMPARISON:  Chest x-ray 07/02/2018, CT chest 02/17/2018 FINDINGS: Cardiovascular: Nonaneurysmal aorta. Mild aortic atherosclerosis. Normal heart size. No pericardial effusion Mediastinum/Nodes: Midline trachea. No thyroid mass. No significant adenopathy. Esophagus within normal limits Lungs/Pleura: Mild emphysema. Stable scarring at the apex of the right upper lobe. No acute consolidation, pleural effusion or pneumothorax. Upper Abdomen: No acute abnormality. Cyst in the upper pole of the left kidney Musculoskeletal: Old left rib fractures. No acute or suspicious abnormality. IMPRESSION: 1. No acute pulmonary infiltrate is visualized. Mild scarring in the apical portion of the right upper lobe 2. Mild emphysema Aortic Atherosclerosis (ICD10-I70.0) and Emphysema (ICD10-J43.9). Electronically Signed   By: Donavan Foil M.D.   On: 07/02/2018 03:17     ASSESSMENT AND PLAN:   *Pharyngeal abscess, multiple pockets.  Seems to be spontaneously draining purulent phlegm.   Appreciate ENT input.  Continue IV antibiotics and Decadron.  Can start prednisone taper tomorrow. Hopefully will not need any surgical intervention.  *Chronic bronchitis with tobacco abuse.  Inhalers as needed.  *Hypertension.  Continue home medications.  Plan  All the records  are reviewed and case discussed with Care Management/Social Worker Management plans discussed with the patient, family and they are in agreement.  CODE STATUS: FULL CODE  DVT Prophylaxis: SCDs  TOTAL TIME TAKING CARE OF THIS PATIENT: 35 minutes.   POSSIBLE D/C IN 1-2 DAYS, DEPENDING ON CLINICAL CONDITION.  Leia Alf Lawrie Tunks M.D on 07/03/2018 at 1:34 PM  Between 7am to 6pm - Pager - [phone]  After 6pm go to [email or web address] - password [email or web address] [facility]  Office  [phone]  CC: Primary care physician; [facility] [place]  Note: This dictation was prepared with Diplomatic Services operational officer dictation along with smaller phrase technology. Any transcriptional errors that result from this process are unintentional.

## 2018-07-03 NOTE — Progress Notes (Signed)
Pharmacy Antibiotic Note  Deanna Ramsey is a 52 y.o. female admitted on 07/02/2018 with oral abscesses.  Pharmacy has been consulted for Unasyn dosing.  Plan: Unasyn 3 grams q 6 hours ordered  Height: 5\' 5"  (165.1 cm) Weight: 104 lb (47.2 kg) IBW/kg (Calculated) : 57  Temp (24hrs), Avg:97.6 F (36.4 C), Min:97.5 F (36.4 C), Max:97.8 F (36.6 C)  Recent Labs  Lab 07/02/18 0151 07/02/18 0416 07/03/18 0446  WBC 18.0*  --  22.9*  CREATININE 0.91  --  0.65  LATICACIDVEN  --  0.4*  --     Estimated Creatinine Clearance: 62 mL/min (by C-G formula based on SCr of 0.65 mg/dL).    Allergies  Allergen Reactions  . Sulfa Antibiotics Nausea And Vomiting    Antimicrobials this admission: Clindamycin and ancef x1 7/25; Unasyn 7/25  >>    >>   Dose adjustments this admission:   Microbiology results: 7/25 BCx: pending 7/25 Group A strep (+)  Thank you for allowing pharmacy to be a part of this patient's care.  Ayah Cozzolino A 07/03/2018 8:39 AM

## 2018-07-03 NOTE — Consult Note (Signed)
..   Loralai, Eisman [id number] 1966-12-01 Hillary Bow, MD  S:  Improved from yesterday.  Able to move head more.  Continues to have some purulent drainage from nasopharynx.  Pain improved.  No breathing difficulty.  Tolerating diet.  Ambulating   Allergies:  Allergies  Allergen Reactions  . Sulfa Antibiotics Nausea And Vomiting    ROS: Review of systems normal other than 12 systems except per HPI.  PMH:  Past Medical History:  Diagnosis Date  . Arthritis   . Cellulitis   . Depression   . Hypertension     FH:  Family History  Problem Relation Age of Onset  . COPD Other   . COPD Mother   . Hypertension Mother   . COPD Father     SH:  Social History   Socioeconomic History  . Marital status: Single    Spouse name: Not on file  . Number of children: Not on file  . Years of education: Not on file  . Highest education level: Not on file  Occupational History  . Not on file  Social Needs  . Financial resource strain: Not on file  . Food insecurity:    Worry: Not on file    Inability: Not on file  . Transportation needs:    Medical: Not on file    Non-medical: Not on file  Tobacco Use  . Smoking status: Current Every Day Smoker    Packs/day: 1.00    Types: Cigarettes  . Smokeless tobacco: Never Used  Substance and Sexual Activity  . Alcohol use: No  . Drug use: No  . Sexual activity: Not on file  Lifestyle  . Physical activity:    Days per week: Not on file    Minutes per session: Not on file  . Stress: Not on file  Relationships  . Social connections:    Talks on phone: Not on file    Gets together: Not on file    Attends religious service: Not on file    Active member of club or organization: Not on file    Attends meetings of clubs or organizations: Not on file    Relationship status: Not on file  . Intimate partner violence:    Fear of current or ex partner: Not on file    Emotionally abused: Not on file    Physically abused: Not on file   Forced sexual activity: Not on file  Other Topics Concern  . Not on file  Social History Narrative  . Not on file    PSH:  Past Surgical History:  Procedure Laterality Date  . arm surgery    . CLEFT LIP REPAIR    . kidney stones    . lymphnode neck      Physical  Exam:  GEN-  CN 2-12 grossly intact and symmetric. EARS-  EAC/TMs normal BL.  OC/OP- thrush on soft palate and uvula, erythema along posterior oropharyngeal walls NOSE-  Clear anteriorly NECK-  No masses or lesions, no LAD, no tenderness.   A/P: 1)  Nasopharyngeal abscesses- improving symptoms, agree with continued abx/steroid and switch to PO  2)  Thrush- will place on diflucan   Taliya Mcclard 07/03/2018 4:57 PM

## 2018-07-04 LAB — CBC WITH DIFFERENTIAL/PLATELET
BAND NEUTROPHILS: 2 %
BLASTS: 0 %
Basophils Absolute: 0 10*3/uL (ref 0–0.1)
Basophils Relative: 0 %
EOS ABS: 0 10*3/uL (ref 0–0.7)
Eosinophils Relative: 0 %
HEMATOCRIT: 34.6 % — AB (ref 35.0–47.0)
HEMOGLOBIN: 11.6 g/dL — AB (ref 12.0–16.0)
LYMPHS PCT: 6 %
Lymphs Abs: 1 10*3/uL (ref 1.0–3.6)
MCH: 31.5 pg (ref 26.0–34.0)
MCHC: 33.6 g/dL (ref 32.0–36.0)
MCV: 93.7 fL (ref 80.0–100.0)
METAMYELOCYTES PCT: 0 %
Monocytes Absolute: 0.7 10*3/uL (ref 0.2–0.9)
Monocytes Relative: 4 %
Myelocytes: 0 %
Neutro Abs: 15.5 10*3/uL — ABNORMAL HIGH (ref 1.4–6.5)
Neutrophils Relative %: 88 %
OTHER: 0 %
Platelets: 355 10*3/uL (ref 150–440)
Promyelocytes Relative: 0 %
RBC: 3.69 MIL/uL — ABNORMAL LOW (ref 3.80–5.20)
RDW: 14 % (ref 11.5–14.5)
WBC: 17.2 10*3/uL — ABNORMAL HIGH (ref 3.6–11.0)
nRBC: 0 /100 WBC

## 2018-07-04 MED ORDER — PREDNISONE 10 MG PO TABS: 10.0000 mg | ORAL_TABLET | Freq: Every day | ORAL | 0 refills | Status: DC

## 2018-07-04 MED ORDER — IBUPROFEN 600 MG PO TABS: 600.0000 mg | ORAL_TABLET | Freq: Four times a day (QID) | ORAL | 0 refills | Status: DC | PRN

## 2018-07-04 MED ORDER — FLUCONAZOLE 100 MG PO TABS: 100.0000 mg | ORAL_TABLET | Freq: Every day | ORAL | 0 refills | Status: AC

## 2018-07-04 MED ORDER — AMOXICILLIN-POT CLAVULANATE 875-125 MG PO TABS: 1.0000 | ORAL_TABLET | Freq: Two times a day (BID) | ORAL | 0 refills | Status: AC

## 2018-07-04 NOTE — Final Consult Note (Cosign Needed)
..   07/04/2018 11:13 AM  Sarina Ser [id number]   Temp:  [96.5 F (35.8 C)-99 F (37.2 C)] 97.9 F (36.6 C) (07/27 0542) Pulse Rate:  [54-102] 54 (07/27 0542) Resp:  [16-24] 24 (07/27 0542) BP: (158-188)/(79-113) 170/80 (07/27 0555) SpO2:  [98 %-99 %] 99 % (07/27 0542) Weight:  [47.3 kg (104 lb 3.2 oz)] 47.3 kg (104 lb 3.2 oz) (07/27 0542),     Intake/Output Summary (Last 24 hours) at 07/04/2018 1113 Last data filed at 07/04/2018 1035 Gross per 24 hour  Intake 2768 ml  Output 450 ml  Net 2318 ml    Results for orders placed or performed during the hospital encounter of 07/02/18 (from the past 24 hour(s))  CBC with Differential/Platelet     Status: Abnormal   Collection Time: 07/04/18  6:34 AM  Result Value Ref Range   WBC 17.2 (H) 3.6 - 11.0 K/uL   RBC 3.69 (L) 3.80 - 5.20 MIL/uL   Hemoglobin 11.6 (L) 12.0 - 16.0 g/dL   HCT 34.6 (L) 35.0 - 47.0 %   MCV 93.7 80.0 - 100.0 fL   MCH 31.5 26.0 - 34.0 pg   MCHC 33.6 32.0 - 36.0 g/dL   RDW 14.0 11.5 - 14.5 %   Platelets 355 150 - 440 K/uL   Neutrophils Relative % 88 %   Lymphocytes Relative 6 %   Monocytes Relative 4 %   Eosinophils Relative 0 %   Basophils Relative 0 %   Band Neutrophils 2 %   Metamyelocytes Relative 0 %   Myelocytes 0 %   Promyelocytes Relative 0 %   Blasts 0 %   nRBC 0 0 /100 WBC   Other 0 %   Neutro Abs 15.5 (H) 1.4 - 6.5 K/uL   Lymphs Abs 1.0 1.0 - 3.6 K/uL   Monocytes Absolute 0.7 0.2 - 0.9 K/uL   Eosinophils Absolute 0.0 0 - 0.7 K/uL   Basophils Absolute 0.0 0 - 0.1 K/uL   Smear Review MORPHOLOGY UNREMARKABLE     SUBJECTIVE:  Improved pain and mobility of neck.  Tolerating diet  OBJECTIVE:  GEN- NAD NOSE-  Clear anteriorly OC/OP-  Thrush still present on posterior oropharynx but slightly improved NECK- no LAD  IMPRESSION:  Nasopharyngitis with nasopharyngeal abscesses improving  PLAN:  OK per ENT perspective to discharge home on oral abx/steroids.  Patient also requesting Flexeril for  muscle pain.  Follow up with Dr. Kathyrn Sheriff in 1 week.  Alta Shober 07/04/2018, 11:13 AM

## 2018-07-04 NOTE — Care Management (Signed)
RNCM consult received for medication assistance. Patient primarily concerned about obtaining medications at discharge. No payer source. Provided patient with medication management application. PCP with [facility]. If patient discharged over the weekend we will provide resources.  Ines Bloomer RN BSN RNCM [phone]

## 2018-07-04 NOTE — Discharge Instructions (Signed)
Take medications as prescribed. It is important to take your antibiotic as described, until complete, even if you are feeling better. Please contact your doctor if you should experience worsening of symptoms, difficulty swallowing, increased swelling, pain and if you should have any questions or concerns.

## 2018-07-04 NOTE — Progress Notes (Signed)
[place] at [place] [place] was admitted to the Hospital on 07/02/2018 and Discharged  07/04/2018 and should be excused from work/school  For above days. She can return to work on 07/08/2018.   Call Saundra Shelling MD with questions.  Saundra Shelling M.D on 07/04/2018,at 12:24 PM  [place] [facility]  [phone]

## 2018-07-04 NOTE — Discharge Summary (Signed)
[place] [facility] [place] NAME: Deanna Ramsey    MR#:  [id number]  DATE OF BIRTH:  Apr 16, 1966  DATE OF ADMISSION:  07/02/2018 ADMITTING PHYSICIAN: Harrie Foreman, MD  DATE OF DISCHARGE: No discharge date for patient encounter.  PRIMARY CARE PHYSICIAN: [facility] [place]   ADMISSION DIAGNOSIS:  Tonsillar abscess [J36] Sepsis, due to unspecified organism [facility]) [A41.9]  DISCHARGE DIAGNOSIS:  Pharyngeal abscess Hypertension  Tobacco abuse  chronic bronchitis  SECONDARY DIAGNOSIS:   Past Medical History:  Diagnosis Date  . Arthritis   . Cellulitis   . Depression   . Hypertension      ADMITTING HISTORY The patient with past medical history of hypertension and recent pneumonia and remote history of epiglottitis presents to the emergency department complaining of neck pain for the last 4 days.  She also complains of headache and cough.  Due to significant swelling in her neck the emergency department obtained a CT scan which showed oropharyngeal abscesses and swelling of airway tissue which prompted the emergency department staff to call the hospitalist service for admission.  HOSPITAL COURSE:  Patient admitted to medical floor.  Patient she was seen by ENT speciality.  Tobacco cessation counseled to the patient and nicotine patch offered.  She received IV Unasyn antibiotic during the stay in the hospital.  Patient received oral Diflucan for oral thrush.  Patient also received IV Decadron during the stay in the hospital.  ENT evaluated the patient but did not recommend any surgical intervention.  Her swelling in the neck improved pain in the neck also decreased.. Patient tolerated a diet well.  No difficulty swallowing food or difficulty breathing.  Patient will be discharged home with follow-up with ENT in the clinic.  She will be discharged on oral antibiotics with tapering dose of steroids.  We will continue Diflucan for oral  thrush orally.  CONSULTS OBTAINED:  Treatment Team:  Carloyn Manner, MD Hillary Bow, MD Saundra Shelling, MD  DRUG ALLERGIES:   Allergies  Allergen Reactions  . Sulfa Antibiotics Nausea And Vomiting    DISCHARGE MEDICATIONS:   Allergies as of 07/04/2018      Reactions   Sulfa Antibiotics Nausea And Vomiting      Medication List    STOP taking these medications   levofloxacin 750 MG tablet Commonly known as:  LEVAQUIN     TAKE these medications   albuterol 108 (90 Base) MCG/ACT inhaler Commonly known as:  PROVENTIL HFA;VENTOLIN HFA Inhale 2 puffs into the lungs every 6 (six) hours as needed for wheezing or shortness of breath.   amoxicillin-clavulanate 875-125 MG tablet Commonly known as:  AUGMENTIN Take 1 tablet by mouth every 12 (twelve) hours for 7 days.   feeding supplement (ENSURE ENLIVE) Liqd Take 237 mLs by mouth 2 (two) times daily between meals.   fluconazole 100 MG tablet Commonly known as:  DIFLUCAN Take 1 tablet (100 mg total) by mouth daily for 10 days. Start taking on:  07/05/2018   guaiFENesin 600 MG 12 hr tablet Commonly known as:  MUCINEX Take 600 mg by mouth 2 (two) times daily.   ibuprofen 600 MG tablet Commonly known as:  ADVIL,MOTRIN Take 1 tablet (600 mg total) by mouth every 6 (six) hours as needed for moderate pain.   lisinopril 20 MG tablet Commonly known as:  PRINIVIL,ZESTRIL Take 20 mg by mouth daily.   nicotine 21 mg/24hr patch Commonly known as:  NICODERM CQ - dosed in mg/24  hours Place 1 patch (21 mg total) onto the skin daily.   predniSONE 10 MG tablet Commonly known as:  DELTASONE Take 1 tablet (10 mg total) by mouth daily. Label  & dispense according to the schedule below.  6 tablets day one, then 5 table day 2, then 4 tablets day 3, then 3 tablets day 4, 2 tablets day 5, then 1 tablet day 6, then stop       Today  Patient seen and evaluated on the day of discharge  tolerating diet well Decreased swelling in the  neck No chest pain   VITAL SIGNS:  Blood pressure (!) 170/80, pulse (!) 54, temperature 97.9 F (36.6 C), temperature source Oral, resp. rate (!) 24, height 5\' 5"  (1.651 m), weight 47.3 kg (104 lb 3.2 oz), last menstrual period 02/28/2012, SpO2 99 %.  I/O:    Intake/Output Summary (Last 24 hours) at 07/04/2018 1533 Last data filed at 07/04/2018 1035 Gross per 24 hour  Intake 2768 ml  Output 450 ml  Net 2318 ml    PHYSICAL EXAMINATION:  Physical Exam  GENERAL:  52 y.o.-year-old patient lying in the bed with no acute distress.  LUNGS: Normal breath sounds bilaterally, no wheezing, rales,rhonchi or crepitation. No use of accessory muscles of respiration.  CARDIOVASCULAR: S1, S2 normal. No murmurs, rubs, or gallops.  ABDOMEN: Soft, non-tender, non-distended. Bowel sounds present. No organomegaly or mass.  NEUROLOGIC: Moves all 4 extremities. PSYCHIATRIC: The patient is alert and oriented x 3.  SKIN: No obvious rash, lesion, or ulcer.   DATA REVIEW:   CBC Recent Labs  Lab 07/04/18 0634  WBC 17.2*  HGB 11.6*  HCT 34.6*  PLT 355    Chemistries  Recent Labs  Lab 07/02/18 0151 07/03/18 0446  NA 136 139  K 3.3* 3.7  CL 105 106  CO2 22 27  GLUCOSE 124* 159*  BUN 25* 21*  CREATININE 0.91 0.65  CALCIUM 8.6* 8.6*  AST 23  --   ALT 17  --   ALKPHOS 97  --   BILITOT 0.5  --     Cardiac Enzymes No results for input(s): TROPONINI in the last 168 hours.  Microbiology Results  Results for orders placed or performed during the hospital encounter of 07/02/18  Group A Strep by PCR     Status: Abnormal   Collection Time: 07/02/18  1:51 AM  Result Value Ref Range Status   Group A Strep by PCR DETECTED (A) NOT DETECTED Final    Comment: Performed at [facility], [address] [place], [place] [address]  Blood Culture (routine x 2)     Status: None (Preliminary result)   Collection Time: 07/02/18  4:10 AM  Result Value Ref Range Status   Specimen Description  BLOOD BLOOD LEFT FOREARM  Final   Special Requests   Final    BOTTLES DRAWN AEROBIC AND ANAEROBIC Blood Culture results may not be optimal due to an inadequate volume of blood received in culture bottles   Culture   Final    NO GROWTH 2 DAYS Performed at [facility], [address], [place], [place] [address]    Report Status PENDING  Incomplete  Blood Culture (routine x 2)     Status: None (Preliminary result)   Collection Time: 07/02/18  4:10 AM  Result Value Ref Range Status   Specimen Description BLOOD BLOOD RIGHT FOREARM  Final   Special Requests   Final    BOTTLES DRAWN AEROBIC AND  ANAEROBIC Blood Culture adequate volume   Culture   Final    NO GROWTH 2 DAYS Performed at [facility], [address] [place], [place] [address]    Report Status PENDING  Incomplete    RADIOLOGY:  No results found.  Follow up with PCP in 1 week.  Management plans discussed with the patient, family and they are in agreement.  CODE STATUS: Full code    Code Status Orders  (From admission, onward)        Start     Ordered   07/02/18 0542  Full code  Continuous     07/02/18 0541    Code Status History    Date Active Date Inactive Code Status Order ID Comments User Context   02/17/2018 1218 02/18/2018 1539 Full Code [id number]  Loletha Grayer, MD ED   04/15/2015 2035 04/18/2015 1444 Full Code [id number]  Hower, Aaron Mose, MD ED    Advance Directive Documentation     Most Recent Value  Type of Advance Directive  Living will  Pre-existing out of facility DNR order (yellow form or pink MOST form)  -  "MOST" Form in Place?  -      TOTAL TIME TAKING CARE OF THIS PATIENT ON DAY OF DISCHARGE: more than 35 minutes.   Saundra Shelling M.D on 07/04/2018 at 3:33 PM  Between 7am to 6pm - Pager - [phone]  After 6pm go to [email or web address] - password [email or web address] [facility]  Office  [phone]  CC: Primary care physician; [facility] [place]  Note: This dictation was prepared with Diplomatic Services operational officer dictation along with smaller phrase technology. Any transcriptional errors that result from this process are unintentional.

## 2018-07-06 NOTE — ED Provider Notes (Addendum)
[facility] Emergency Department Provider Note   First MD Initiated Contact with Patient 07/02/18 [phone]     (approximate)  I have reviewed the triage vital signs and the nursing notes.   HISTORY  Chief Complaint Headache and Nasal Congestion    HPI Deanna Ramsey is a 52 y.o. female with below list of chronic medical conditions including hypertension pneumonia and epiglottitis requiring tracheostomy presents to the emergency department complaining of neck pain and cough x4 days.  Patient also admits to generalized headache as well.  Patient denied any weakness numbness gait instability or visual changes.  Patient notes a subjective fevers at home.  She noted to be febrile and tachycardic on arrival.  Patient does admit to tenderness with palpation of the neck With some swelling noted.  Patient states current pain score is 8 out of 10.   Past Medical History:  Diagnosis Date  . Arthritis   . Cellulitis   . Depression   . Hypertension     Patient Active Problem List   Diagnosis Date Noted  . Cervical lymph node abscess 07/02/2018  . Protein-calorie malnutrition, severe 07/02/2018  . Sepsis ([place]) 02/17/2018  . Cellulitis 04/15/2015    Past Surgical History:  Procedure Laterality Date  . arm surgery    . CLEFT LIP REPAIR    . kidney stones    . lymphnode neck      Prior to Admission medications   Medication Sig Start Date End Date Taking? Authorizing Provider  albuterol (PROVENTIL HFA;VENTOLIN HFA) 108 (90 Base) MCG/ACT inhaler Inhale 2 puffs into the lungs every 6 (six) hours as needed for wheezing or shortness of breath. 05/27/18  Yes Merlyn Lot, MD  guaiFENesin (MUCINEX) 600 MG 12 hr tablet Take 600 mg by mouth 2 (two) times daily.   Yes [provider]  lisinopril (PRINIVIL,ZESTRIL) 20 MG tablet Take 20 mg by mouth daily.   Yes [provider]  nicotine (NICODERM CQ - DOSED IN MG/24 HOURS) 21 mg/24hr patch Place 1 patch (21  mg total) onto the skin daily. 02/19/18  Yes Bettey Costa, MD  amoxicillin-clavulanate (AUGMENTIN) 875-125 MG tablet Take 1 tablet by mouth every 12 (twelve) hours for 7 days. 07/04/18 07/11/18  Saundra Shelling, MD  feeding supplement, ENSURE ENLIVE, (ENSURE ENLIVE) LIQD Take 237 mLs by mouth 2 (two) times daily between meals. 02/18/18   Bettey Costa, MD  fluconazole (DIFLUCAN) 100 MG tablet Take 1 tablet (100 mg total) by mouth daily for 10 days. 07/05/18 07/15/18  Saundra Shelling, MD  ibuprofen (ADVIL,MOTRIN) 600 MG tablet Take 1 tablet (600 mg total) by mouth every 6 (six) hours as needed for moderate pain. 07/04/18   Saundra Shelling, MD  predniSONE (DELTASONE) 10 MG tablet Take 1 tablet (10 mg total) by mouth daily. Label  & dispense according to the schedule below.  6 tablets day one, then 5 table day 2, then 4 tablets day 3, then 3 tablets day 4, 2 tablets day 5, then 1 tablet day 6, then stop 07/04/18   Saundra Shelling, MD    Allergies Sulfa antibiotics  Family History  Problem Relation Age of Onset  . COPD Other   . COPD Mother   . Hypertension Mother   . COPD Father     Social History Social History   Tobacco Use  . Smoking status: Current Every Day Smoker    Packs/day: 1.00    Types: Cigarettes  . Smokeless tobacco: Never Used  Substance Use  Topics  . Alcohol use: No  . Drug use: No    Review of Systems Constitutional: No fever/chills Eyes: No visual changes. ENT: Positive for sore throat and neck swelling. Cardiovascular: Denies chest pain. Respiratory: Denies shortness of breath.  Cough Gastrointestinal: No abdominal pain.  No nausea, no vomiting.  No diarrhea.  No constipation. Genitourinary: Negative for dysuria. Musculoskeletal: Negative for neck pain.  Negative for back pain. Integumentary: Negative for rash. Neurological: Positive for headaches, negative for focal weakness or numbness.   ____________________________________________   PHYSICAL EXAM:  VITAL SIGNS: ED  Triage Vitals [07/02/18 0049]  Enc Vitals Group     BP 135/85     Pulse Rate (!) 102     Resp 18     Temp 100 F (37.8 C)     Temp Source Oral     SpO2 98 %     Weight 45.4 kg (100 lb)     Height 1.651 m (5\' 5" )     Head Circumference      Peak Flow      Pain Score 10     Pain Loc      Pain Edu?      Excl. in [place]?     Constitutional: Alert and oriented.  Apparent discomfort  eyes: Conjunctivae are normal.  Head: Atraumatic. Nose: No congestion/rhinnorhea. Mouth/Throat: Mucous membranes are dry.  Pharyngeal erythema with exudates noted Neck: No stridor.  No meningeal signs.  Submandibular submental swelling noted. Cardiovascular: Tachycardia, regular rhythm. Good peripheral circulation. Grossly normal heart sounds. Respiratory: Normal respiratory effort.  No retractions. Lungs CTAB. Gastrointestinal: Soft and nontender. No distention.  Musculoskeletal: No lower extremity tenderness nor edema. No gross deformities of extremities. Neurologic:  Normal speech and language. No gross focal neurologic deficits are appreciated.  Skin:  Skin is warm, dry and intact. No rash noted. Psychiatric: Mood and affect are normal. Speech and behavior are normal.  ____________________________________________   LABS (all labs ordered are listed, but only abnormal results are displayed)  Labs Reviewed  GROUP A STREP BY PCR - Abnormal; Notable for the following components:      Result Value   Group A Strep by PCR DETECTED (*)    All other components within normal limits  CBC - Abnormal; Notable for the following components:   WBC 18.0 (*)    RBC 3.64 (*)    Hemoglobin 11.7 (*)    HCT 34.3 (*)    All other components within normal limits  COMPREHENSIVE METABOLIC PANEL - Abnormal; Notable for the following components:   Potassium 3.3 (*)    Glucose, Bld 124 (*)    BUN 25 (*)    Calcium 8.6 (*)    Albumin 3.1 (*)    All other components within normal limits  LACTIC ACID, PLASMA -  Abnormal; Notable for the following components:   Lactic Acid, Venous 0.4 (*)    All other components within normal limits  HEMOGLOBIN A1C - Abnormal; Notable for the following components:   Hgb A1c MFr Bld 5.8 (*)    All other components within normal limits  BASIC METABOLIC PANEL - Abnormal; Notable for the following components:   Glucose, Bld 159 (*)    BUN 21 (*)    Calcium 8.6 (*)    All other components within normal limits  CBC WITH DIFFERENTIAL/PLATELET - Abnormal; Notable for the following components:   WBC 22.9 (*)    RBC 3.51 (*)    Hemoglobin 11.0 (*)  HCT 32.9 (*)    Neutro Abs 20.6 (*)    Monocytes Absolute 1.1 (*)    All other components within normal limits  CBC WITH DIFFERENTIAL/PLATELET - Abnormal; Notable for the following components:   WBC 17.2 (*)    RBC 3.69 (*)    Hemoglobin 11.6 (*)    HCT 34.6 (*)    Neutro Abs 15.5 (*)    All other components within normal limits  CULTURE, BLOOD (ROUTINE X 2)  CULTURE, BLOOD (ROUTINE X 2)  TSH  HIV ANTIBODY (ROUTINE TESTING)   ____________________________________________  EKG  ED ECG REPORT I, Merlin N Damarcus Reggio, the attending physician, personally viewed and interpreted this ECG.   Date: 07/02/2018  EKG Time: 4:39 AM  Rate: 80  Rhythm: Normal sinus rhythm  Axis: Normal  Intervals: Normal  ST&T Change: None  ____________________________________________  RADIOLOGY I, [place] N Skye Plamondon, personally viewed and evaluated these images (plain radiographs) as part of my medical decision making, as well as reviewing the written report by the radiologist.  ED MD interpretation: Severe tonsillopharyngitis with multiple adenoid and tonsillar abscesses  Official radiology report(s): CLINICAL DATA:  Sore throat and stridor.  Cough and headache.  Fever  EXAM: CT NECK WITH CONTRAST  TECHNIQUE: Multidetector CT imaging of the neck was performed using the standard protocol following the bolus administration of  intravenous contrast.  CONTRAST:  167mL ISOVUE-300 IOPAMIDOL (ISOVUE-300) INJECTION 61%  COMPARISON:  None.  FINDINGS: PHARYNX AND LARYNX:  --Nasopharynx: The adenoid tonsils are enlarged with multiple internal fluid collections, the largest of which measures 2.1 x 1.1 cm.  --Oral cavity and oropharynx: Palatine tonsils are only mildly enlarged, but there is marked enlargement of the lingual tonsils, filling the vallecula.  --Hypopharynx: There is narrowing of the piriform sinuses and hypopharyngeal airway. Epiglottis is normal.  --Larynx: There is severe edema of the aryepiglottic folds, arytenoid muscles and the inferior pharyngeal constrictor muscles with moderate mass effect on the supraglottic airway.  --Retropharyngeal space: Retropharyngeal effusion extends from the level of the oropharynx to the level of the vocal cords. No mediastinal extension.  SALIVARY GLANDS:  --Parotid: No mass lesion or inflammation. No sialolithiasis or ductal dilatation.  --Submandibular: Symmetric without inflammation. No sialolithiasis or ductal dilatation.  --Sublingual: Normal. No ranula or other visible lesion of the base of tongue and floor of mouth.  THYROID: Normal.  LYMPH NODES: Numerous bilateral hyperattenuating and large lymph nodes at cervical levels 2, 3 and 5.  VASCULAR: Major cervical vessels are patent.  LIMITED INTRACRANIAL: Normal.  VISUALIZED ORBITS: Normal.  MASTOIDS AND VISUALIZED PARANASAL SINUSES: No fluid levels or advanced mucosal thickening. No mastoid effusion.  SKELETON: No bony spinal canal stenosis. No lytic or blastic lesions.  OTHER: None.  IMPRESSION: 1. Severe tonsillopharyngitis with multiloculated adenoid peritonsillar abscess, with largest fluid pocket measuring up to 2.1 x 1.1 cm. The lingual and palatine tonsils are also moderately enlarged. 2. Severe edema of the aryepiglottic folds and the arytenoid  and pharyngeal constrictor muscles with associated moderate-to-severe narrowing of the supraglottic airway. 3. Retropharyngeal effusion extending from the oropharynx to the level of the true vocal cords. No mediastinal extension. 4. Multilevel reactive cervical lymphadenopathy.  These results were called by telephone at the time of interpretation on 07/02/2018 at 3:34 am to Dr. Marjean Donna , who verbally acknowledged these results.   Electronically Signed   By: Ulyses Jarred M.D.   On: 07/02/2018 03:36 ____________________________________________     Critical Care performed:  .Critical Care Performed by:  Gregor Hams, MD Authorized by: Gregor Hams, MD   Critical care provider statement:    Critical care time (minutes):  45   Critical care time was exclusive of:  Separately billable procedures and treating other patients   Critical care was necessary to treat or prevent imminent or life-threatening deterioration of the following conditions:  Sepsis   Critical care was time spent personally by me on the following activities:  Development of treatment plan with patient or surrogate, discussions with consultants, evaluation of patient's response to treatment, examination of patient, obtaining history from patient or surrogate, ordering and performing treatments and interventions, ordering and review of laboratory studies, ordering and review of radiographic studies, pulse oximetry, re-evaluation of patient's condition and review of old charts   I assumed direction of critical care for this patient from another provider in my specialty: no       ____________________________________________   INITIAL IMPRESSION / ASSESSMENT AND PLAN / ED COURSE  As part of my medical decision making, I reviewed the following data within the electronic MEDICAL RECORD NUMBER    52 year old female presenting with above-stated history and physical exam secondary to sore throat and cough.   Given sore throat, erythema with exudate of the pharynx with swelling of the patient's neck CT scan of the neck was performed which revealed multiple tonsillar lucencies concerning for multiple abscess.  In addition strep was positive.  Patient tachycardic febrile with a leukocytosis of 18 as such sepsis protocol was initiated.  Patient received IV clindamycin 900 mg and Decadron 20 mg IV.  Patient discussed with Dr. Collins Scotland radiologist regarding CT scan findings.  Patient protecting airway well at this time statin 100% on room air with no apparent rest tori difficulty and as such we will forego endotracheal intubation at this time despite evidence of airway narrowing on CT scan.  In addition patient discussed with Dr. Kathyrn Sheriff ENT on-call who will evaluate the patient this morning with recommendation for the patient to be admitted to the hospitalist.  Patient discussed with Dr. Marcille Blanco hospitalist on-call who admitted the patient for further evaluation and management.  Patient was informed of all clinical findings as well.     ____________________________________________  FINAL CLINICAL IMPRESSION(S) / ED DIAGNOSES  Final diagnoses:  Tonsillar abscess  Sepsis, due to unspecified organism [facility])     MEDICATIONS GIVEN DURING THIS VISIT:  Medications  morphine 2 MG/ML injection 2 mg (2 mg Intravenous Given 07/02/18 0201)  ondansetron (ZOFRAN) injection 4 mg (4 mg Intravenous Given 07/02/18 0201)  ipratropium-albuterol (DUONEB) 0.5-2.5 (3) MG/3ML nebulizer solution 3 mL (3 mLs Nebulization Given 07/02/18 0201)  methylPREDNISolone sodium succinate (SOLU-MEDROL) 125 mg/2 mL injection 125 mg (125 mg Intravenous Given 07/02/18 0208)  iopamidol (ISOVUE-300) 61 % injection 100 mL (100 mLs Intravenous Contrast Given 07/02/18 0248)  dexamethasone (DECADRON) injection 10 mg (10 mg Intravenous Given 07/02/18 0354)  clindamycin (CLEOCIN) IVPB 900 mg (0 mg Intravenous Stopped 07/02/18 0444)  morphine 2 MG/ML  injection 2 mg (2 mg Intravenous Given 07/02/18 0406)  ceFAZolin (ANCEF) IVPB 1 g/50 mL premix (0 g Intravenous Stopped 07/02/18 0523)  dexamethasone (DECADRON) injection 8 mg (8 mg Intravenous Given 07/02/18 1842)     ED Discharge Orders        Ordered    fluconazole (DIFLUCAN) 100 MG tablet  Daily     07/04/18 1138    amoxicillin-clavulanate (AUGMENTIN) 875-125 MG tablet  Every 12 hours     07/04/18 1138    predniSONE (  DELTASONE) 10 MG tablet  Daily     07/04/18 1138    ibuprofen (ADVIL,MOTRIN) 600 MG tablet  Every 6 hours PRN     07/04/18 1138       Note:  This document was prepared using Dragon voice recognition software and may include unintentional dictation errors.    Gregor Hams, MD 07/06/18 2222    Gregor Hams, MD 07/06/18 2224    Gregor Hams, MD 07/10/18 2245

## 2018-07-07 LAB — CULTURE, BLOOD (ROUTINE X 2)
Culture: NO GROWTH
Culture: NO GROWTH
Special Requests: ADEQUATE

## 2019-12-10 DIAGNOSIS — Z9221 Personal history of antineoplastic chemotherapy: Secondary | ICD-10-CM

## 2019-12-10 DIAGNOSIS — Z923 Personal history of irradiation: Secondary | ICD-10-CM

## 2019-12-10 HISTORY — DX: Personal history of irradiation: Z92.3

## 2019-12-10 HISTORY — DX: Personal history of antineoplastic chemotherapy: Z92.21

## 2020-01-10 DIAGNOSIS — C50919 Malignant neoplasm of unspecified site of unspecified female breast: Secondary | ICD-10-CM

## 2020-01-10 HISTORY — PX: BREAST BIOPSY: SHX20

## 2020-01-10 HISTORY — DX: Malignant neoplasm of unspecified site of unspecified female breast: C50.919

## 2020-01-17 NOTE — Progress Notes (Signed)
Patient pre-screened for BCCCP eligibility due to COVID 19 precautions. Two patient identifiers used for verification that I was speaking to correct patient. She will arrive at [facility] at 8:00 01/18/20 for BCCCP screening.

## 2020-01-18 ENCOUNTER — Other Ambulatory Visit: Payer: Self-pay

## 2020-01-18 ENCOUNTER — Ambulatory Visit
Admission: RE | Admit: 2020-01-18 | Discharge: 2020-01-18 | Disposition: A | Discharge status: Home / Self Care | Payer: Self-pay | Source: Ambulatory Visit | Attending: Oncology | Admitting: Oncology

## 2020-01-18 ENCOUNTER — Ambulatory Visit: Payer: Self-pay | Attending: Oncology

## 2020-01-18 VITALS — BP 81/56 | HR 87 | Temp 94.0°F | Ht 65.5 in | Wt 106.0 lb

## 2020-01-18 DIAGNOSIS — Z0000 Encounter for general adult medical examination without abnormal findings: Secondary | ICD-10-CM

## 2020-01-18 DIAGNOSIS — R590 Localized enlarged lymph nodes: Secondary | ICD-10-CM

## 2020-01-18 DIAGNOSIS — N63 Unspecified lump in unspecified breast: Secondary | ICD-10-CM

## 2020-01-18 NOTE — Progress Notes (Signed)
  Subjective:     Patient ID: Deanna Ramsey, female   DOB: 1965-12-29, 54 y.o.   MRN: [id number]  HPI   Review of Systems     Objective:   Physical Exam Chest:     Breasts:        Right: No swelling, bleeding, inverted nipple, mass, nipple discharge, skin change or tenderness.        Left: Mass and tenderness present. No swelling, bleeding, inverted nipple, nipple discharge or skin change.       Comments: Firm, mobile 1.5 cm. Mass left breast 4 o'clock 5 cm. From areola Genitourinary:    Labia:        Right: No rash, tenderness, lesion or injury.        Left: No rash, tenderness, lesion or injury.      Cervix: No cervical motion tenderness, discharge, friability, lesion, erythema, cervical bleeding or eversion.     Uterus: Not deviated, not enlarged, not fixed, not tender and no uterine prolapse.      Adnexa:        Right: No mass, tenderness or fullness.         Left: No mass, tenderness or fullness.       Comments: cervial os atrophic       Assessment:     54 year old patient presents for [place] clinic visit.  Patient screened, and meets BCCCP eligibility.  Patient does not have insurance, Medicare or Medicaid. Instructed patient on breast self awareness using teach back method.  Palpated 1.5 cm. Firm mass at 4 o'clcok left breast 5 cm. From areola.  Patient reports she first noticed two weeks ago when she rubbed her hand across breast.  Pelvic exam normal.     Plan:     Sent for bilateral diagnostic mammogram, and ultrasound.  Specimen collected for pap.

## 2020-01-19 ENCOUNTER — Ambulatory Visit
Admission: RE | Admit: 2020-01-19 | Discharge: 2020-01-19 | Disposition: A | Discharge status: Home / Self Care | Payer: Self-pay | Source: Ambulatory Visit | Attending: Oncology | Admitting: Oncology

## 2020-01-19 DIAGNOSIS — R590 Localized enlarged lymph nodes: Secondary | ICD-10-CM

## 2020-01-19 DIAGNOSIS — N63 Unspecified lump in unspecified breast: Secondary | ICD-10-CM

## 2020-01-20 ENCOUNTER — Other Ambulatory Visit: Payer: Self-pay | Admitting: Anatomic Pathology & Clinical Pathology

## 2020-01-20 DIAGNOSIS — C50919 Malignant neoplasm of unspecified site of unspecified female breast: Secondary | ICD-10-CM

## 2020-01-20 NOTE — Progress Notes (Signed)
Received message from [facility] Radiology, that patient has received pathology results. Introductory phone call to initiate  Navigation Service. Patient is known to navigators, as she has been in the [employer] for several years.   Scheduled Med/Onc Consult and Surgical consult for 01/27/20.  Discussed appointment information with patient. Will give Breast Cancer Treatment Handbook/folder with hospital services, and fill out [facility] application at first Med/Onc visit. Deanna Ramsey

## 2020-01-22 DIAGNOSIS — C50212 Malignant neoplasm of upper-inner quadrant of left female breast: Secondary | ICD-10-CM | POA: Insufficient documentation

## 2020-01-22 NOTE — Progress Notes (Deleted)
[facility]  [phone] [phone]) [phone]  ID: Madaline Savage OB: 16-Oct-1966  MR#: [id number]  HBZ#:[id number]  Patient Care Team: [facility] as PCP - General (General Practice) Rico Junker, RN as Registered Nurse Theodore Demark, RN as Oncology Nurse Navigator  CHIEF COMPLAINT: ***  INTERVAL HISTORY: ***  REVIEW OF SYSTEMS:   ROS  As per HPI. Otherwise, a complete review of systems is negative.  PAST MEDICAL HISTORY: Past Medical History:  Diagnosis Date  . Arthritis   . Cellulitis   . Depression   . Hypertension     PAST SURGICAL HISTORY: Past Surgical History:  Procedure Laterality Date  . arm surgery    . CLEFT LIP REPAIR    . kidney stones    . lymphnode neck      FAMILY HISTORY: Family History  Problem Relation Age of Onset  . COPD Other   . COPD Mother   . Hypertension Mother   . COPD Father   . Breast cancer Neg Hx     ADVANCED DIRECTIVES (Y/N):  N  HEALTH MAINTENANCE: Social History   Tobacco Use  . Smoking status: Current Every Day Smoker    Packs/day: 1.00    Types: Cigarettes  . Smokeless tobacco: Never Used  Substance Use Topics  . Alcohol use: No  . Drug use: No     Colonoscopy:  PAP:  Bone density:  Lipid panel:  Allergies  Allergen Reactions  . Sulfa Antibiotics Nausea And Vomiting    Current Outpatient Medications  Medication Sig Dispense Refill  . albuterol (PROVENTIL HFA;VENTOLIN HFA) 108 (90 Base) MCG/ACT inhaler Inhale 2 puffs into the lungs every 6 (six) hours as needed for wheezing or shortness of breath. 1 Inhaler 2  . feeding supplement, ENSURE ENLIVE, (ENSURE ENLIVE) LIQD Take 237 mLs by mouth 2 (two) times daily between meals. 237 mL 12  . guaiFENesin (MUCINEX) 600 MG 12 hr tablet Take 600 mg by mouth 2 (two) times daily.    Marland Kitchen ibuprofen (ADVIL,MOTRIN) 600 MG tablet Take 1 tablet (600 mg total) by mouth every 6 (six) hours as needed for moderate pain.  30 tablet 0  . lisinopril (PRINIVIL,ZESTRIL) 20 MG tablet Take 20 mg by mouth daily.    . nicotine (NICODERM CQ - DOSED IN MG/24 HOURS) 21 mg/24hr patch Place 1 patch (21 mg total) onto the skin daily. 28 patch 0  . predniSONE (DELTASONE) 10 MG tablet Take 1 tablet (10 mg total) by mouth daily. Label  & dispense according to the schedule below.  6 tablets day one, then 5 table day 2, then 4 tablets day 3, then 3 tablets day 4, 2 tablets day 5, then 1 tablet day 6, then stop 21 tablet 0   No current facility-administered medications for this visit.    OBJECTIVE: There were no vitals filed for this visit.   There is no height or weight on file to calculate BMI.    ECOG FS:{CHL ONC [id number]  General: Well-developed, well-nourished, no acute distress. Eyes: Pink conjunctiva, anicteric sclera. HEENT: Normocephalic, moist mucous membranes. Lungs: No audible wheezing or coughing. Heart: Regular rate and rhythm. Abdomen: Soft, nontender, no obvious distention. Musculoskeletal: No edema, cyanosis, or clubbing. Neuro: Alert, answering all questions appropriately. Cranial nerves grossly intact. Skin: No rashes or petechiae noted. Psych: Normal affect. Lymphatics: No cervical, calvicular, axillary or inguinal LAD.   LAB RESULTS:  Lab Results  Component Value Date   NA 139 07/03/2018  K 3.7 07/03/2018   CL 106 07/03/2018   CO2 27 07/03/2018   GLUCOSE 159 (H) 07/03/2018   BUN 21 (H) 07/03/2018   CREATININE 0.65 07/03/2018   CALCIUM 8.6 (L) 07/03/2018   PROT 6.9 07/02/2018   ALBUMIN 3.1 (L) 07/02/2018   AST 23 07/02/2018   ALT 17 07/02/2018   ALKPHOS 97 07/02/2018   BILITOT 0.5 07/02/2018   GFRNONAA >60 07/03/2018   GFRAA >60 07/03/2018    Lab Results  Component Value Date   WBC 17.2 (H) 07/04/2018   NEUTROABS 15.5 (H) 07/04/2018   HGB 11.6 (L) 07/04/2018   HCT 34.6 (L) 07/04/2018   MCV 93.7 07/04/2018   PLT 355 07/04/2018     STUDIES: US BREAST LTD UNI LEFT INC  AXILLA  Addendum Date: 01/19/2020   ADDENDUM REPORT: 01/19/2020 14:44 ADDENDUM: When the patient return for her left breast and left axillary node biopsies on today, 01/19/2020, she asked that I not perform a biopsy of the left axillary lymph node. She was anxious about this procedure, and stated she had a bad experience with an axillary procedure in the past. Therefore, only the left breast biopsy was performed today. Electronically Signed   By: Curlene Dolphin M.D.   On: 01/19/2020 14:44   Result Date: 01/19/2020 CLINICAL DATA:  54 year old female with a palpable area of concern in the left breast. EXAM: DIGITAL DIAGNOSTIC BILATERAL MAMMOGRAM WITH CAD AND TOMO LEFT BREAST ULTRASOUND COMPARISON:  Previous exam(s). ACR Breast Density Category d: The breast tissue is extremely dense, which lowers the sensitivity of mammography. FINDINGS: No suspicious masses or calcifications are seen in the right breast. Spot compression tomograms were performed over the palpable area of concern in the far outer left breast demonstrating a mass with associated distortion partially visualized. Mammographic images were processed with CAD. Physical examination of the far outer left breast reveals a firm mass at the approximate 3 o'clock position. Targeted ultrasound of the outer left breast was performed. There is an irregular hypoechoic mass at the 3 o'clock position 10 cm from nipple measuring 1.5 x 0.9 x 1.3 cm. This corresponds well with mammography findings. Three lymph nodes with borderline cortical thickening are visualized in the left axilla. IMPRESSION: 1.  Suspicious 1.5 cm palpable mass in the left breast. 2.  Mildly suspicious lymph nodes in the left axilla. RECOMMENDATION: 1. Recommend ultrasound-guided biopsy of the palpable mass in the outer left breast. 2. Recommend ultrasound-guided biopsy of the largest lymph node with borderline cortical thickening in the left axilla. I have discussed the findings and  recommendations with the patient. If applicable, a reminder letter will be sent to the patient regarding the next appointment. BI-RADS CATEGORY  5: Highly suggestive of malignancy. Electronically Signed: By: Everlean Alstrom M.D. On: 01/18/2020 10:05   MS DIGITAL DIAG TOMO BILAT  Addendum Date: 01/19/2020   ADDENDUM REPORT: 01/19/2020 14:44 ADDENDUM: When the patient return for her left breast and left axillary node biopsies on today, 01/19/2020, she asked that I not perform a biopsy of the left axillary lymph node. She was anxious about this procedure, and stated she had a bad experience with an axillary procedure in the past. Therefore, only the left breast biopsy was performed today. Electronically Signed   By: Curlene Dolphin M.D.   On: 01/19/2020 14:44   Result Date: 01/19/2020 CLINICAL DATA:  54 year old female with a palpable area of concern in the left breast. EXAM: DIGITAL DIAGNOSTIC BILATERAL MAMMOGRAM WITH CAD AND TOMO LEFT BREAST  ULTRASOUND COMPARISON:  Previous exam(s). ACR Breast Density Category d: The breast tissue is extremely dense, which lowers the sensitivity of mammography. FINDINGS: No suspicious masses or calcifications are seen in the right breast. Spot compression tomograms were performed over the palpable area of concern in the far outer left breast demonstrating a mass with associated distortion partially visualized. Mammographic images were processed with CAD. Physical examination of the far outer left breast reveals a firm mass at the approximate 3 o'clock position. Targeted ultrasound of the outer left breast was performed. There is an irregular hypoechoic mass at the 3 o'clock position 10 cm from nipple measuring 1.5 x 0.9 x 1.3 cm. This corresponds well with mammography findings. Three lymph nodes with borderline cortical thickening are visualized in the left axilla. IMPRESSION: 1.  Suspicious 1.5 cm palpable mass in the left breast. 2.  Mildly suspicious lymph nodes in the left  axilla. RECOMMENDATION: 1. Recommend ultrasound-guided biopsy of the palpable mass in the outer left breast. 2. Recommend ultrasound-guided biopsy of the largest lymph node with borderline cortical thickening in the left axilla. I have discussed the findings and recommendations with the patient. If applicable, a reminder letter will be sent to the patient regarding the next appointment. BI-RADS CATEGORY  5: Highly suggestive of malignancy. Electronically Signed: By: Everlean Alstrom M.D. On: 01/18/2020 10:05   [place] LT BREAST BX W LOC DEV 1ST LESION IMG BX SPEC US GUIDE  Addendum Date: 01/20/2020   ADDENDUM REPORT: 01/20/2020 12:47 ADDENDUM: PATHOLOGY revealed: A. BREAST, LEFT AT 3:00, 10 CM FROM THE NIPPLE; ULTRASOUND-GUIDED CORE NEEDLE BIOPSY: - INVASIVE MAMMARY CARCINOMA, NO SPECIAL TYPE. 10 mm in this sample. Grade 3. Ductal carcinoma in situ: Not identified. Lymphovascular invasion: Not identified. Pathology results are CONCORDANT with imaging findings, per Dr. Curlene Dolphin. Pathology results and recommendations below were discussed with patient by telephone on 01/20/2020. Patient reported biopsy site doing well with slight tenderness at the site. Post biopsy care instructions were reviewed and questions were answered. Patient was instructed to call [facility] if any concerns or questions arise related to the biopsy. Recommendation: Surgical referral. Request for surgical referral was relayed to nurse navigators at [facility] by Electa Sniff RN on 01/21/2020. Addendum by Electa Sniff RN on 01/20/2020. Electronically Signed   By: Curlene Dolphin M.D.   On: 01/20/2020 12:47   Result Date: 01/20/2020 CLINICAL DATA:  Ultrasound-guided core needle biopsy was recommended of a palpable left breast mass at 3 o'clock position 10 cm from the nipple. Ultrasound-guided core needle biopsy was also recommended of one of the patient's left axillary lymph nodes with borderline cortical thickening.  Today, after the left breast biopsy, she declined the left axillary lymph node biopsy, as she had some stinging pain with the first pass of the left breast biopsy, and was nervous about proceeding with a left axillary lymph node biopsy. 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 She and I discussed the possibility that the mild cortical thickening of multiple lymph nodes in the left axilla may be related to the COVID vaccine. EXAM: ULTRASOUND GUIDED LEFT BREAST CORE NEEDLE BIOPSY COMPARISON:  Previous exam(s). FINDINGS: I met with the patient and we discussed the procedure of ultrasound-guided biopsy, including benefits and alternatives. We discussed the high likelihood of a successful procedure. We discussed the risks of the procedure, including infection, bleeding, tissue injury, clip migration,  and inadequate sampling. Informed written consent was given. The usual time-out protocol was performed immediately prior to the procedure. Lesion quadrant: Upper outer quadrant Using sterile technique and 1% Lidocaine as local anesthetic, under direct ultrasound visualization, a 14 gauge spring-loaded device was used to perform biopsy of a suspicious palpable mass far lateral left breast 3 o'clock position 10 cm from the nipple using a lateral approach. At the conclusion of the procedure coil tissue marker clip was deployed into the biopsy cavity. The coil biopsy clip is well visualized under ultrasound. Given the patient's breast tenderness after the biopsy, the far lateral position of the mass making it difficult to visualize mammographically, and the easy visualization of the coil biopsy clip under ultrasound, a post clip mammogram was not performed. IMPRESSION: Ultrasound guided biopsy of the left breast. No apparent complications. Please note that the patient declined a left axillary lymph node biopsy today, as  described above. Electronically Signed: By: Curlene Dolphin M.D. On: 01/19/2020 09:11    ASSESSMENT: Clinic stage ... 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  PLAN:    Patient expressed understanding and was in agreement with this plan. She also understands that She can call clinic at any time with any questions, concerns, or complaints.   Cancer Staging No matching staging information was found for the patient.  Lloyd Huger, MD   01/22/2020 10:46 AM

## 2020-01-26 ENCOUNTER — Other Ambulatory Visit: Payer: Self-pay

## 2020-01-26 LAB — IGP, APTIMA HPV: HPV Aptima: NEGATIVE

## 2020-01-26 NOTE — Progress Notes (Signed)
Completed BCCM application.  Worked to reschedule surgical, and Med/Onc consult due to inclement weather.  Patient to return 01/31/20 for both visits.

## 2020-01-27 ENCOUNTER — Inpatient Hospital Stay: Payer: Self-pay | Admitting: Oncology

## 2020-01-27 ENCOUNTER — Ambulatory Visit: Payer: Self-pay | Admitting: General Surgery

## 2020-01-27 ENCOUNTER — Other Ambulatory Visit: Payer: Self-pay

## 2020-01-27 NOTE — Progress Notes (Signed)
Notified patient of negative/negative pap results.  Will be due for next pap in 5 years. Copy to [place].

## 2020-01-27 NOTE — Progress Notes (Signed)
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  INTERVAL HISTORY: Patient is a 54 year old female who self palpated a lump in her left breast.  Subsequent mammogram, ultrasound, biopsy revealed the above-stated breast cancer.  She is anxious, but otherwise feels well.  She has no neurologic complaints.  She denies any recent fevers or illnesses.  She has good appetite and denies weight loss.  She has no chest pain, shortness of breath, cough, or hemoptysis.  She denies any nausea, vomiting, constipation, or diarrhea.  She has no urinary complaints.  Patient otherwise feels well and offers no further specific complaints today.  REVIEW OF SYSTEMS:   Review of Systems  Constitutional: Negative.  Negative for fever, malaise/fatigue and weight loss.  Respiratory: Negative.  Negative for cough, hemoptysis and shortness of breath.   Cardiovascular: Negative.  Negative for chest pain and leg swelling.  Gastrointestinal: Negative.  Negative for abdominal pain.  Genitourinary: Negative.  Negative for dysuria.  Musculoskeletal: Negative.  Negative for back pain.  Skin: Negative.  Negative for rash.  Neurological: Negative.  Negative for dizziness, focal weakness, weakness and headaches.  Psychiatric/Behavioral: The patient is nervous/anxious.     As per HPI. Otherwise, a complete review of systems is negative.  PAST MEDICAL HISTORY: Past Medical History:  Diagnosis Date  . Arthritis   . Cellulitis   . Depression   . Hypertension     PAST SURGICAL HISTORY: Past Surgical History:  Procedure Laterality Date  . arm  surgery    . CLEFT LIP REPAIR    . kidney stones    . lymphnode neck      FAMILY HISTORY: Family History  Problem Relation Age of Onset  . COPD Other   . COPD Mother   . Hypertension Mother   . COPD Father   . Breast cancer Neg Hx     ADVANCED DIRECTIVES (Y/N):  N  HEALTH MAINTENANCE: Social History   Tobacco Use  . Smoking status: Current Every Day Smoker    Packs/day: 1.00    Types: Cigarettes  . Smokeless tobacco: Never Used  Substance Use Topics  . Alcohol use: No  . Drug use: No     Colonoscopy:  PAP:  Bone density:  Lipid panel:  Allergies  Allergen Reactions  . Sulfa Antibiotics Nausea And Vomiting    Current Outpatient Medications  Medication Sig Dispense Refill  . albuterol (PROVENTIL HFA;VENTOLIN HFA) 108 (90 Base) MCG/ACT inhaler Inhale 2 puffs into the lungs every 6 (six) hours as needed for wheezing or shortness of breath. 1 Inhaler 2  . cetirizine (ZYRTEC) 10 MG tablet Take 10 mg by mouth daily.    . fluticasone (FLOVENT HFA) 110 MCG/ACT inhaler Inhale into the lungs 2 (two) times daily.    Marland Kitchen lisinopril-hydrochlorothiazide (ZESTORETIC) 20-12.5 MG tablet Take 1 tablet by mouth daily.    . feeding supplement, ENSURE ENLIVE, (ENSURE ENLIVE) LIQD Take 237 mLs by mouth 2 (two) times daily between meals. (Patient not taking: Reported on 01/31/2020) 237 mL 12  . nicotine (NICODERM CQ - DOSED IN MG/24 HOURS) 21 mg/24hr patch Place 1 patch (21 mg total) onto the skin daily. (  Patient not taking: Reported on 01/31/2020) 28 patch 0   No current facility-administered medications for this visit.    OBJECTIVE: Vitals:   01/31/20 0919  BP: 108/81  Pulse: 68  Resp: 20  Temp: (!) 94.4 F (34.7 C)  SpO2: 100%     Body mass index is 17.86 kg/m.    ECOG FS:0 - Asymptomatic  General: Well-developed, well-nourished, no acute distress. Eyes: Pink conjunctiva, anicteric sclera. HEENT: Normocephalic, moist mucous membranes. Breast: Easily palpable left breast  mass. Lungs: No audible wheezing or coughing. Heart: Regular rate and rhythm. Abdomen: Soft, nontender, no obvious distention. Musculoskeletal: No edema, cyanosis, or clubbing. Neuro: Alert, answering all questions appropriately. Cranial nerves grossly intact. Skin: No rashes or petechiae noted. Psych: Normal affect. Lymphatics: No cervical, calvicular, axillary or inguinal LAD.   LAB RESULTS:  Lab Results  Component Value Date   NA 139 07/03/2018   K 3.7 07/03/2018   CL 106 07/03/2018   CO2 27 07/03/2018   GLUCOSE 159 (H) 07/03/2018   BUN 21 (H) 07/03/2018   CREATININE 0.65 07/03/2018   CALCIUM 8.6 (L) 07/03/2018   PROT 6.9 07/02/2018   ALBUMIN 3.1 (L) 07/02/2018   AST 23 07/02/2018   ALT 17 07/02/2018   ALKPHOS 97 07/02/2018   BILITOT 0.5 07/02/2018   GFRNONAA >60 07/03/2018   GFRAA >60 07/03/2018    Lab Results  Component Value Date   WBC 17.2 (H) 07/04/2018   NEUTROABS 15.5 (H) 07/04/2018   HGB 11.6 (L) 07/04/2018   HCT 34.6 (L) 07/04/2018   MCV 93.7 07/04/2018   PLT 355 07/04/2018     STUDIES: US BREAST LTD UNI LEFT INC AXILLA  Addendum Date: 01/19/2020   ADDENDUM REPORT: 01/19/2020 14:44 ADDENDUM: When the patient return for her left breast and left axillary node biopsies on today, 01/19/2020, she asked that I not perform a biopsy of the left axillary lymph node. She was anxious about this procedure, and stated she had a bad experience with an axillary procedure in the past. Therefore, only the left breast biopsy was performed today. Electronically Signed   By: Curlene Dolphin M.D.   On: 01/19/2020 14:44   Result Date: 01/19/2020 CLINICAL DATA:  54 year old female with a palpable area of concern in the left breast. EXAM: DIGITAL DIAGNOSTIC BILATERAL MAMMOGRAM WITH CAD AND TOMO LEFT BREAST ULTRASOUND COMPARISON:  Previous exam(s). ACR Breast Density Category d: The breast tissue is extremely dense, which lowers the sensitivity of mammography. FINDINGS: No  suspicious masses or calcifications are seen in the right breast. Spot compression tomograms were performed over the palpable area of concern in the far outer left breast demonstrating a mass with associated distortion partially visualized. Mammographic images were processed with CAD. Physical examination of the far outer left breast reveals a firm mass at the approximate 3 o'clock position. Targeted ultrasound of the outer left breast was performed. There is an irregular hypoechoic mass at the 3 o'clock position 10 cm from nipple measuring 1.5 x 0.9 x 1.3 cm. This corresponds well with mammography findings. Three lymph nodes with borderline cortical thickening are visualized in the left axilla. IMPRESSION: 1.  Suspicious 1.5 cm palpable mass in the left breast. 2.  Mildly suspicious lymph nodes in the left axilla. RECOMMENDATION: 1. Recommend ultrasound-guided biopsy of the palpable mass in the outer left breast. 2. Recommend ultrasound-guided biopsy of the largest lymph node with borderline cortical thickening in the left axilla. I have discussed the findings and recommendations with the patient. If  applicable, a reminder letter will be sent to the patient regarding the next appointment. BI-RADS CATEGORY  5: Highly suggestive of malignancy. Electronically Signed: By: Everlean Alstrom M.D. On: 01/18/2020 10:05   MS DIGITAL DIAG TOMO BILAT  Addendum Date: 01/19/2020   ADDENDUM REPORT: 01/19/2020 14:44 ADDENDUM: When the patient return for her left breast and left axillary node biopsies on today, 01/19/2020, she asked that I not perform a biopsy of the left axillary lymph node. She was anxious about this procedure, and stated she had a bad experience with an axillary procedure in the past. Therefore, only the left breast biopsy was performed today. Electronically Signed   By: Curlene Dolphin M.D.   On: 01/19/2020 14:44   Result Date: 01/19/2020 CLINICAL DATA:  54 year old female with a palpable area of concern in  the left breast. EXAM: DIGITAL DIAGNOSTIC BILATERAL MAMMOGRAM WITH CAD AND TOMO LEFT BREAST ULTRASOUND COMPARISON:  Previous exam(s). ACR Breast Density Category d: The breast tissue is extremely dense, which lowers the sensitivity of mammography. FINDINGS: No suspicious masses or calcifications are seen in the right breast. Spot compression tomograms were performed over the palpable area of concern in the far outer left breast demonstrating a mass with associated distortion partially visualized. Mammographic images were processed with CAD. Physical examination of the far outer left breast reveals a firm mass at the approximate 3 o'clock position. Targeted ultrasound of the outer left breast was performed. There is an irregular hypoechoic mass at the 3 o'clock position 10 cm from nipple measuring 1.5 x 0.9 x 1.3 cm. This corresponds well with mammography findings. Three lymph nodes with borderline cortical thickening are visualized in the left axilla. IMPRESSION: 1.  Suspicious 1.5 cm palpable mass in the left breast. 2.  Mildly suspicious lymph nodes in the left axilla. RECOMMENDATION: 1. Recommend ultrasound-guided biopsy of the palpable mass in the outer left breast. 2. Recommend ultrasound-guided biopsy of the largest lymph node with borderline cortical thickening in the left axilla. I have discussed the findings and recommendations with the patient. If applicable, a reminder letter will be sent to the patient regarding the next appointment. BI-RADS CATEGORY  5: Highly suggestive of malignancy. Electronically Signed: By: Everlean Alstrom M.D. On: 01/18/2020 10:05   [place] LT BREAST BX W LOC DEV 1ST LESION IMG BX SPEC US GUIDE  Addendum Date: 01/20/2020   ADDENDUM REPORT: 01/20/2020 12:47 ADDENDUM: PATHOLOGY revealed: A. BREAST, LEFT AT 3:00, 10 CM FROM THE NIPPLE; ULTRASOUND-GUIDED CORE NEEDLE BIOPSY: - INVASIVE MAMMARY CARCINOMA, NO SPECIAL TYPE. 10 mm in this sample. Grade 3. Ductal carcinoma in situ: Not  identified. Lymphovascular invasion: Not identified. Pathology results are CONCORDANT with imaging findings, per Dr. Curlene Dolphin. Pathology results and recommendations below were discussed with patient by telephone on 01/20/2020. Patient reported biopsy site doing well with slight tenderness at the site. Post biopsy care instructions were reviewed and questions were answered. Patient was instructed to call [facility] if any concerns or questions arise related to the biopsy. Recommendation: Surgical referral. Request for surgical referral was relayed to nurse navigators at [facility] by Electa Sniff RN on 01/21/2020. Addendum by Electa Sniff RN on 01/20/2020. Electronically Signed   By: Curlene Dolphin M.D.   On: 01/20/2020 12:47   Result Date: 01/20/2020 CLINICAL DATA:  Ultrasound-guided core needle biopsy was recommended of a palpable left breast mass at 3 o'clock position 10 cm from the nipple. Ultrasound-guided core needle biopsy was also recommended of one of the  patient's left axillary lymph nodes with borderline cortical thickening. Today, after the left breast biopsy, she declined the left axillary lymph node biopsy, as she had some stinging pain with the first pass of the left breast biopsy, and was nervous about proceeding with a left axillary lymph node biopsy. 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 She and I discussed the possibility that the mild cortical thickening of multiple lymph nodes in the left axilla may be related to the COVID vaccine. EXAM: ULTRASOUND GUIDED LEFT BREAST CORE NEEDLE BIOPSY COMPARISON:  Previous exam(s). FINDINGS: I met with the patient and we discussed the procedure of ultrasound-guided biopsy, including benefits and alternatives. We discussed the high likelihood of a successful procedure. We discussed the risks of the procedure, including  infection, bleeding, tissue injury, clip migration, and inadequate sampling. Informed written consent was given. The usual time-out protocol was performed immediately prior to the procedure. Lesion quadrant: Upper outer quadrant Using sterile technique and 1% Lidocaine as local anesthetic, under direct ultrasound visualization, a 14 gauge spring-loaded device was used to perform biopsy of a suspicious palpable mass far lateral left breast 3 o'clock position 10 cm from the nipple using a lateral approach. At the conclusion of the procedure coil tissue marker clip was deployed into the biopsy cavity. The coil biopsy clip is well visualized under ultrasound. Given the patient's breast tenderness after the biopsy, the far lateral position of the mass making it difficult to visualize mammographically, and the easy visualization of the coil biopsy clip under ultrasound, a post clip mammogram was not performed. IMPRESSION: Ultrasound guided biopsy of the left breast. No apparent complications. Please note that the patient declined a left axillary lymph node biopsy today, as described above. Electronically Signed: By: Curlene Dolphin M.D. 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  PLAN:    1. 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  She will also receive port placement at the time of her surgery.  Given the triple negative status of her disease, she will then receive adjuvant chemotherapy using Adriamycin and Cytoxan with Udenyca support followed by weekly Taxol x12.  Patient also require adjuvant XRT at the conclusion of her chemotherapy.  An aromatase inhibitor would not offer any benefit given the triple negative status of her disease.   Patient also will require a referral to genetics in the near future.  Return to clinic 1 week after her surgery to discuss her final pathology results and additional treatment planning.  I spent a total of 60 minutes reviewing chart data, face-to-face evaluation with the patient, counseling and coordination of care as detailed above.   Patient expressed understanding and was in agreement with this plan. She also understands that She can call clinic at any time with any questions, concerns, or complaints.   Cancer Staging Carcinoma of upper-inner quadrant of left female breast [facility]) Staging form: Breast, AJCC 8th Edition - Clinical stage from 02/01/2020: Stage IB (cT1c, cN0, cM0, G3, ER-, PR-, HER2-) - Signed by Lloyd Huger, MD on 02/01/2020   Lloyd Huger, MD   02/01/2020 6:38 AM

## 2020-01-31 ENCOUNTER — Encounter: Payer: Self-pay | Admitting: Surgery

## 2020-01-31 ENCOUNTER — Other Ambulatory Visit: Payer: Self-pay

## 2020-01-31 ENCOUNTER — Ambulatory Visit (INDEPENDENT_AMBULATORY_CARE_PROVIDER_SITE_OTHER): Payer: Medicaid Other | Admitting: Surgery

## 2020-01-31 ENCOUNTER — Encounter: Payer: Self-pay | Admitting: *Deleted

## 2020-01-31 ENCOUNTER — Inpatient Hospital Stay: Payer: Medicaid Other | Attending: Oncology | Admitting: Oncology

## 2020-01-31 ENCOUNTER — Encounter: Payer: Self-pay | Admitting: Oncology

## 2020-01-31 VITALS — BP 108/81 | HR 68 | Temp 94.4°F | Resp 20 | Wt 109.0 lb

## 2020-01-31 VITALS — BP 108/77 | HR 76 | Temp 95.0°F | Ht 65.5 in | Wt 108.8 lb

## 2020-01-31 DIAGNOSIS — F17210 Nicotine dependence, cigarettes, uncomplicated: Secondary | ICD-10-CM | POA: Insufficient documentation

## 2020-01-31 DIAGNOSIS — C50212 Malignant neoplasm of upper-inner quadrant of left female breast: Secondary | ICD-10-CM | POA: Insufficient documentation

## 2020-01-31 DIAGNOSIS — Z171 Estrogen receptor negative status [ER-]: Secondary | ICD-10-CM | POA: Insufficient documentation

## 2020-01-31 LAB — SURGICAL PATHOLOGY

## 2020-01-31 NOTE — Patient Instructions (Addendum)
Dr.Pabon discussed with patient the Lumpectomy treatment option for patient.  He would like for patient to have surgery this Friday.  Our surgery scheduler Marzetta Board will contact you within the next 24-48 hours. Marzetta Board will contact you to discuss the dates and times of the surgery, along with the preparation of the surgery. Dr.Pabon recommend patient to have a face to face appointment with Pre-Operative Anesthesiologist. They will contact patient to schedule the appointment.   Breast Cancer, Female  Breast cancer is a malignant growth of tissue (tumor) in the breast. Unlike noncancerous (benign) tumors, malignant tumors are cancerous and can spread to other parts of the body. The two most common types of breast cancer start in the milk ducts (ductal carcinoma) or in the lobules where milk is made in the breast (lobular carcinoma). Breast cancer is one of the most common types of cancer in women. What are the causes? The exact cause of female breast cancer is unknown. What increases the risk? The following factors may make you more likely to develop this condition:  Being older than 54 years of age.  Race and ethnicity. Caucasian women generally have an increased risk, but African-American women are more likely to develop the disease before age 26.  Having a family history of breast cancer.  Having had breast cancer in the past.  Having certain noncancerous conditions of the breast, such as dense breast tissue.  Having the BRCA1 and BRCA2 genes.  Having a history of radiation exposure.  Obesity.  Starting menopause after age 37.  Starting your menstrual periods before age 36.  Having never been pregnant or having your first child after age 34.  Having never breastfed.  Using hormone therapy after menopause.  Using birth control pills.  Drinking more than one alcoholic drink a day.  Exposure to the drug DES, which was given to pregnant women from the 1940s to the 1970s. What are  the signs or symptoms? Symptoms of this condition include:  A painless lump or thickening in your breast.  Changes in the size or shape of your breast.  Breast skin changes, such as puckering or dimpling.  Nipple abnormalities, such as scaling, crustiness, redness, or pulling in (retraction).  Nipple discharge that is bloody or clear. How is this diagnosed? This condition may be diagnosed by:  Taking your medical history and doing a physical exam. During the exam, your health care provider will feel the tissue around your breast and under your arms.  Taking a sample of nipple discharge. The sample will be examined under a microscope.  Performing imaging tests, such as breast X-rays (mammogram), breast ultrasound exams, or an MRI.  Taking a tissue sample (biopsy) from the breast. The sample will be examined under a microscope to look for cancer cells.  Taking a sample from the lymph nodes near the affected breast (sentinel node biopsy). Your cancer will be staged to determine its severity and extent. Staging is a careful attempt to find out the size of the tumor, whether the cancer has spread, and if so, to what parts of the body. Staging also includes testing your tumor for certain receptors, such as estrogen, progesterone, and human epidermal growth factor receptor 2 (HER2). This will help your cancer care team decide on a treatment that will work best for you. You may need to have more tests to determine the stage of your cancer. Stages include the following:  Stage 0--The tumor has not spread to other breast tissue.  Stage I--The cancer  is only found in the breast or may be in the lymph nodes. The tumor may be up to  in (2 cm) wide.  Stage II--The cancer has spread to nearby lymph nodes. The tumor may be up to 2 in (5 cm) wide.  Stage III--The cancer has spread to more distant lymph nodes. The tumor may be larger than 2 in (5 cm) wide.  Stage IV--The cancer has spread to other  parts of the body, such as the bones, brain, liver, or lungs. How is this treated? Treatment for this condition depends on the type and stage of the breast cancer. It may be treated with:  Surgery. This may involve breast-conserving surgery (lumpectomy or partial mastectomy) in which only the part of the breast containing the cancer is removed. Some normal tissue surrounding this area may also be removed. In some cases, surgery may be done to remove the entire breast (mastectomy) and nipple. Lymph nodes may also be removed.  Radiation therapy, which uses high-energy rays to kill cancer cells.  Chemotherapy, which is the use of drugs to kill cancer cells.  Hormone therapy, which involves taking medicine to adjust the hormone levels in your body. You may take medicine to decrease your estrogen levels. This can help stop cancer cells from growing.  Targeted therapy, in which drugs are used to block the growth and spread of cancer cells. These drugs target a specific part of the cancer cell and usually cause fewer side effects than chemotherapy. Targeted therapy may be used alone or in combination with chemotherapy.  A combination of surgery, radiation, chemotherapy, or hormone therapy may be needed to treat breast cancer. Follow these instructions at home:  Take over-the-counter and prescription medicines only as told by your health care provider.  Eat a healthy diet. A healthy diet includes lots of fruits and vegetables, low-fat dairy products, lean meats, and fiber. ? Make sure half your plate is filled with fruits or vegetables. ? Choose high-fiber foods such as whole-grain breads and cereals.  Consider joining a support group. This may help you learn to cope with the stress of having breast cancer.  Talk to your health care team about exercise and physical activity. The right exercise program can: ? Help prevent or reduce symptoms such as fatigue or depression. ? Improve overall health  and survival rates.  Keep all follow-up visits as told by your health care provider. This is important. Where to find more information  American Cancer Society: www.cancer.[place]: www.cancer.gov Contact a health care provider if:  You have a sudden increase in pain.  You have any symptoms or changes that concern you.  You lose weight without trying.  You notice a new lump in either breast or under your arm.  You develop swelling in either arm or hand.  You have a fever.  You notice new fatigue or weakness. Get help right away if:  You have chest pain or trouble breathing.  You faint. Summary  Breast cancer is a malignant growth of tissue (tumor) in the breast.  Your cancer will be staged to determine its severity and extent.  Treatment for this condition depends on the type and stage of the breast cancer. This information is not intended to replace advice given to you by your health care provider. Make sure you discuss any questions you have with your health care provider. Document Revised: 11/07/2017 Document Reviewed: 07/21/2017 Elsevier Patient Education  [place].

## 2020-01-31 NOTE — H&P (View-Only) (Signed)
Patient ID: Deanna Ramsey, female   DOB: January 26, 1966, 55 y.o.   MRN: [id number]  HPI Deanna Ramsey is a 54 y.o. 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  She reports that she felt a lump during a self breast examination.  No fevers no chills no pain.  The last prior normal mammogram was 2 years ago.  There is no family history of breast cancer.  Her menarche was at age 45.  No prior pregnancies.  Denies any nipple discharge or skin changes.  No breast pain.  No fevers no chills.  She does have a history of COPD and uses inhalers but she does Wear oxygen at home.  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  She also has had head and neck infections.  She denies any shortness of breath or any chest pain.  She is able to perform more than 4 METS of activity without any angina or shortness of breath. Milligram and ultrasound personally reviewed showing evidence of 1.5 cm mass with suspicious findings.  This was biopsied and is consistent with invasive carcinoma.  Pending markers at this time.  She declined biopsy of the left axilla because of anxiety.  HPI  Past Medical History:  Diagnosis Date  . Arthritis   . Cellulitis   . Depression   . Hypertension     Past Surgical History:  Procedure Laterality Date  . arm surgery    . CLEFT LIP REPAIR    . kidney stones    . lymphnode neck      Family History  Problem Relation Age of Onset  . COPD Other   . COPD Mother   . Hypertension Mother   . COPD Father   . Breast cancer Neg Hx     Social History Social History   Tobacco Use  . Smoking status: Current Every Day Smoker    Packs/day: 1.00    Types: Cigarettes  . Smokeless tobacco: Never Used  Substance Use Topics  . Alcohol use: No  . Drug use: No    Allergies  Allergen Reactions  . Sulfa Antibiotics Nausea And  Vomiting    Current Outpatient Medications  Medication Sig Dispense Refill  . albuterol (PROVENTIL HFA;VENTOLIN HFA) 108 (90 Base) MCG/ACT inhaler Inhale 2 puffs into the lungs every 6 (six) hours as needed for wheezing or shortness of breath. 1 Inhaler 2  . cetirizine (ZYRTEC) 10 MG tablet Take 10 mg by mouth daily.    . fluticasone (FLOVENT HFA) 110 MCG/ACT inhaler Inhale into the lungs 2 (two) times daily.    Marland Kitchen lisinopril-hydrochlorothiazide (ZESTORETIC) 20-12.5 MG tablet Take 1 tablet by mouth daily.    . feeding supplement, ENSURE ENLIVE, (ENSURE ENLIVE) LIQD Take 237 mLs by mouth 2 (two) times daily between meals. (Patient not taking: Reported on 01/31/2020) 237 mL 12  . nicotine (NICODERM CQ - DOSED IN MG/24 HOURS) 21 mg/24hr patch Place 1 patch (21 mg total) onto the skin daily. (Patient not taking: Reported on 01/31/2020) 28 patch 0   No current facility-administered medications for this visit.     Review of Systems Full ROS  was asked and was negative except for the information on the HPI  Physical Exam Blood pressure 108/77, pulse 76, temperature (!) 95 F (35 C), temperature source Temporal, height 5' 5.5" (  1.664 m), weight 108 lb 12.8 oz (49.4 kg), last menstrual period 02/28/2012, SpO2 (!) 88 %. CONSTITUTIONAL: NAD EYES: Pupils are equal, round, and reactive to light, Sclera are non-icteric. EARS, NOSE, MOUTH AND THROAT: The oropharynx is clear. The oral mucosa is pink and moist. Hearing is intact to voice. LYMPH NODES:  Lymph nodes in the neck are normal. RESPIRATORY:  Lungs are clear. There is normal respiratory effort, with equal breath sounds bilaterally, and without pathologic use of accessory muscles. CARDIOVASCULAR: Heart is regular without murmurs, gallops, or rubs. BREAST: 1.5 cm mass Let upper outer quadrant 6 cms from the nipple, 2 o'clock, mobile and hard. No axillary LAD GI: The abdomen is soft, nontender, and nondistended. There are no palpable masses. There is  no hepatosplenomegaly. There are normal bowel sounds in all quadrants. GU: Rectal deferred.   MUSCULOSKELETAL: Normal muscle strength and tone. No cyanosis or edema.   SKIN: Turgor is good and there are no pathologic skin lesions or ulcers. NEUROLOGIC: Motor and sensation is grossly normal. Cranial nerves are grossly intact. PSYCH:  Oriented to person, place and time. Affect is normal.  Data Reviewed  I have personally reviewed the patient's imaging, laboratory findings and medical records.    Assessment/Plan 54 year old female with newly diagnosed invasive mammary carcinoma pending markers.  Discussed with the patient in detail about the nature of breast cancer.  Multidisciplinary aspect.  I have also discussed with Deanna Ramsey who is in agreement with me with upfront surgery.  Discussed with her about options of mastectomy versus lumpectomy and radiation therapy.  She wants to do lumpectomy.  Procedure discussed with the patient in detail.  Risk benefit and possible complications including but not limited to: Bleeding, infection, breast deformity, chronic pain and lymphedema.  She understands and wishes to proceed.  We will perform a sentinel lymph node biopsy during the same setting.  Patient on final pathology may require chemotherapy and port placement. Extensive counseling provided to the patient.  Given that she did have extensive head and neck surgery and a prior tracheotomy I do think it is prudent to obtain a preoperative anesthesia evaluation.  Deanna Hamman, MD FACS General Surgeon 01/31/2020, 11:13 AM

## 2020-01-31 NOTE — Progress Notes (Signed)
Met patient during her initial medical oncology consult with dr. [name].  ER/PR and Her2 are still pending.  Patient to proceed with surgery and follow up with Dr. [name] 1-2 weeks after surgery.

## 2020-01-31 NOTE — Progress Notes (Signed)
Patient ID: LAKE ERNEY, female   DOB: 1966-04-23, 54 y.o.   MRN: [id number]  HPI Deanna Ramsey is a 54 y.o. 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  She reports that she felt a lump during a self breast examination.  No fevers no chills no pain.  The last prior normal mammogram was 2 years ago.  There is no family history of breast cancer.  Her menarche was at age 11.  No prior pregnancies.  Denies any nipple discharge or skin changes.  No breast pain.  No fevers no chills.  She does have a history of COPD and uses inhalers but she does Wear oxygen at home.  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  She also has had head and neck infections.  She denies any shortness of breath or any chest pain.  She is able to perform more than 4 METS of activity without any angina or shortness of breath. Milligram and ultrasound personally reviewed showing evidence of 1.5 cm mass with suspicious findings.  This was biopsied and is consistent with invasive carcinoma.  Pending markers at this time.  She declined biopsy of the left axilla because of anxiety.  HPI  Past Medical History:  Diagnosis Date  . Arthritis   . Cellulitis   . Depression   . Hypertension     Past Surgical History:  Procedure Laterality Date  . arm surgery    . CLEFT LIP REPAIR    . kidney stones    . lymphnode neck      Family History  Problem Relation Age of Onset  . COPD Other   . COPD Mother   . Hypertension Mother   . COPD Father   . Breast cancer Neg Hx     Social History Social History   Tobacco Use  . Smoking status: Current Every Day Smoker    Packs/day: 1.00    Types: Cigarettes  . Smokeless tobacco: Never Used  Substance Use Topics  . Alcohol use: No  . Drug use: No    Allergies  Allergen Reactions  . Sulfa Antibiotics Nausea And  Vomiting    Current Outpatient Medications  Medication Sig Dispense Refill  . albuterol (PROVENTIL HFA;VENTOLIN HFA) 108 (90 Base) MCG/ACT inhaler Inhale 2 puffs into the lungs every 6 (six) hours as needed for wheezing or shortness of breath. 1 Inhaler 2  . cetirizine (ZYRTEC) 10 MG tablet Take 10 mg by mouth daily.    . fluticasone (FLOVENT HFA) 110 MCG/ACT inhaler Inhale into the lungs 2 (two) times daily.    Marland Kitchen lisinopril-hydrochlorothiazide (ZESTORETIC) 20-12.5 MG tablet Take 1 tablet by mouth daily.    . feeding supplement, ENSURE ENLIVE, (ENSURE ENLIVE) LIQD Take 237 mLs by mouth 2 (two) times daily between meals. (Patient not taking: Reported on 01/31/2020) 237 mL 12  . nicotine (NICODERM CQ - DOSED IN MG/24 HOURS) 21 mg/24hr patch Place 1 patch (21 mg total) onto the skin daily. (Patient not taking: Reported on 01/31/2020) 28 patch 0   No current facility-administered medications for this visit.     Review of Systems Full ROS  was asked and was negative except for the information on the HPI  Physical Exam Blood pressure 108/77, pulse 76, temperature (!) 95 F (35 C), temperature source Temporal, height 5' 5.5" (  1.664 m), weight 108 lb 12.8 oz (49.4 kg), last menstrual period 02/28/2012, SpO2 (!) 88 %. CONSTITUTIONAL: NAD EYES: Pupils are equal, round, and reactive to light, Sclera are non-icteric. EARS, NOSE, MOUTH AND THROAT: The oropharynx is clear. The oral mucosa is pink and moist. Hearing is intact to voice. LYMPH NODES:  Lymph nodes in the neck are normal. RESPIRATORY:  Lungs are clear. There is normal respiratory effort, with equal breath sounds bilaterally, and without pathologic use of accessory muscles. CARDIOVASCULAR: Heart is regular without murmurs, gallops, or rubs. BREAST: 1.5 cm mass Let upper outer quadrant 6 cms from the nipple, 2 o'clock, mobile and hard. No axillary LAD GI: The abdomen is soft, nontender, and nondistended. There are no palpable masses. There is  no hepatosplenomegaly. There are normal bowel sounds in all quadrants. GU: Rectal deferred.   MUSCULOSKELETAL: Normal muscle strength and tone. No cyanosis or edema.   SKIN: Turgor is good and there are no pathologic skin lesions or ulcers. NEUROLOGIC: Motor and sensation is grossly normal. Cranial nerves are grossly intact. PSYCH:  Oriented to person, place and time. Affect is normal.  Data Reviewed  I have personally reviewed the patient's imaging, laboratory findings and medical records.    Assessment/Plan 54 year old female with newly diagnosed invasive mammary carcinoma pending markers.  Discussed with the patient in detail about the nature of breast cancer.  Multidisciplinary aspect.  I have also discussed with Dr. [name] who is in agreement with me with upfront surgery.  Discussed with her about options of mastectomy versus lumpectomy and radiation therapy.  She wants to do lumpectomy.  Procedure discussed with the patient in detail.  Risk benefit and possible complications including but not limited to: Bleeding, infection, breast deformity, chronic pain and lymphedema.  She understands and wishes to proceed.  We will perform a sentinel lymph node biopsy during the same setting.  Patient on final pathology may require chemotherapy and port placement. Extensive counseling provided to the patient.  Given that she did have extensive head and neck surgery and a prior tracheotomy I do think it is prudent to obtain a preoperative anesthesia evaluation.  Caroleen Hamman, MD FACS General Surgeon 01/31/2020, 11:13 AM

## 2020-02-01 ENCOUNTER — Telehealth: Payer: Self-pay | Admitting: Surgery

## 2020-02-01 NOTE — Telephone Encounter (Signed)
Pt has been advised of pre admission date/time, Covid Testing date and Surgery date.  Surgery Date: 02/04/20 Preadmission Testing Date: 0224/21 (8a-1p); RN will reach out to anesthesia & based on their recommendation the pt may/may not have to complete an in-office pre-admit interview Covid Testing Date: 02/03/20 - patient advised to go to the [facility] ([place])  Patient has been made aware to call [phone], between 1-3:00pm the day before surgery, to find out what time to arrive.

## 2020-02-02 ENCOUNTER — Encounter
Admission: RE | Admit: 2020-02-02 | Discharge: 2020-02-02 | Disposition: A | Discharge status: Home / Self Care | Payer: Medicaid Other | Source: Ambulatory Visit | Attending: Surgery | Admitting: Surgery

## 2020-02-02 ENCOUNTER — Other Ambulatory Visit: Payer: Self-pay

## 2020-02-02 DIAGNOSIS — Z20822 Contact with and (suspected) exposure to covid-19: Secondary | ICD-10-CM | POA: Insufficient documentation

## 2020-02-02 DIAGNOSIS — Z01812 Encounter for preprocedural laboratory examination: Secondary | ICD-10-CM | POA: Diagnosis not present

## 2020-02-02 DIAGNOSIS — Z01810 Encounter for preprocedural cardiovascular examination: Secondary | ICD-10-CM | POA: Insufficient documentation

## 2020-02-02 DIAGNOSIS — I10 Essential (primary) hypertension: Secondary | ICD-10-CM | POA: Insufficient documentation

## 2020-02-02 HISTORY — DX: Personal history of urinary calculi: Z87.442

## 2020-02-02 HISTORY — DX: Pneumonia, unspecified organism: J18.9

## 2020-02-02 HISTORY — DX: Chronic obstructive pulmonary disease, unspecified: J44.9

## 2020-02-02 LAB — CBC WITH DIFFERENTIAL/PLATELET
Abs Immature Granulocytes: 0.02 10*3/uL (ref 0.00–0.07)
Basophils Absolute: 0.1 10*3/uL (ref 0.0–0.1)
Basophils Relative: 1 %
Eosinophils Absolute: 0.1 10*3/uL (ref 0.0–0.5)
Eosinophils Relative: 2 %
HCT: 44.7 % (ref 36.0–46.0)
Hemoglobin: 14.6 g/dL (ref 12.0–15.0)
Immature Granulocytes: 0 %
Lymphocytes Relative: 33 %
Lymphs Abs: 2.5 10*3/uL (ref 0.7–4.0)
MCH: 31.7 pg (ref 26.0–34.0)
MCHC: 32.7 g/dL (ref 30.0–36.0)
MCV: 97 fL (ref 80.0–100.0)
Monocytes Absolute: 0.3 10*3/uL (ref 0.1–1.0)
Monocytes Relative: 4 %
Neutro Abs: 4.5 10*3/uL (ref 1.7–7.7)
Neutrophils Relative %: 60 %
Platelets: 386 10*3/uL (ref 150–400)
RBC: 4.61 MIL/uL (ref 3.87–5.11)
RDW: 12.7 % (ref 11.5–15.5)
WBC: 7.5 10*3/uL (ref 4.0–10.5)
nRBC: 0 % (ref 0.0–0.2)

## 2020-02-02 LAB — PROTIME-INR
INR: 0.9 (ref 0.8–1.2)
Prothrombin Time: 11.8 seconds (ref 11.4–15.2)

## 2020-02-02 LAB — COMPREHENSIVE METABOLIC PANEL
ALT: 11 U/L (ref 0–44)
AST: 19 U/L (ref 15–41)
Albumin: 4.7 g/dL (ref 3.5–5.0)
Alkaline Phosphatase: 85 U/L (ref 38–126)
Anion gap: 11 (ref 5–15)
BUN: 36 mg/dL — ABNORMAL HIGH (ref 6–20)
CO2: 23 mmol/L (ref 22–32)
Calcium: 9.5 mg/dL (ref 8.9–10.3)
Chloride: 104 mmol/L (ref 98–111)
Creatinine, Ser: 1.28 mg/dL — ABNORMAL HIGH (ref 0.44–1.00)
GFR calc Af Amer: 55 mL/min — ABNORMAL LOW (ref >60)
GFR calc non Af Amer: 48 mL/min — ABNORMAL LOW (ref >60)
Glucose, Bld: 134 mg/dL — ABNORMAL HIGH (ref 70–99)
Potassium: 4.6 mmol/L (ref 3.5–5.1)
Sodium: 138 mmol/L (ref 135–145)
Total Bilirubin: 0.6 mg/dL (ref 0.3–1.2)
Total Protein: 8.2 g/dL — ABNORMAL HIGH (ref 6.5–8.1)

## 2020-02-02 NOTE — Pre-Procedure Instructions (Signed)
EKG sent to Dr Andree Elk for review via secure chat.  Deanna Ramsey:  Can you review today's EKG. I found one in EPIC dated Sept 26, 2015 that notes "possible anterior infarct"   Adams:  Looks good...ekg without significant change. JA

## 2020-02-02 NOTE — Patient Instructions (Addendum)
Your procedure is scheduled on: Friday, Feb. 26 Report to Radiology Dept. at the time you were instructed.  REMEMBER: Instructions that are not followed completely may result in serious medical risk, up to and including death; or upon the discretion of your surgeon and anesthesiologist your surgery may need to be rescheduled.  Do not eat food after midnight the night before surgery.  No gum chewing, lozengers or hard candies.  You may however, drink CLEAR liquids up to 2 hours before you are scheduled to arrive for your surgery. Do not drink anything within 2 hours of the start of your surgery.  Clear liquids include: - water  - apple juice without pulp - gatorade (not RED) - black coffee or tea (Do NOT add milk or creamers to the coffee or tea) Do NOT drink anything that is not on this list.  TAKE THESE MEDICATIONS THE MORNING OF SURGERY WITH A SIP OF WATER:  1.  Tylenol if needed for pain 2.  Albuterol inhaler 3.  Flovent inhaler  Use inhalers on the day of surgery and bring to the hospital.  Stop Anti-inflammatories (NSAIDS) such as Advil, Aleve, Ibuprofen, Motrin, Naproxen, Naprosyn and Aspirin based products such as Excedrin, Goodys Powder, BC Powder. (May take Tylenol or Acetaminophen if needed.)  Stop ANY OVER THE COUNTER supplements until after surgery.  No Alcohol for 24 hours before or after surgery.  No Smoking including e-cigarettes for 24 hours prior to surgery.  No chewable tobacco products for at least 6 hours prior to surgery.  No nicotine patches on the day of surgery.  On the morning of surgery brush your teeth with toothpaste and water, you may rinse your mouth with mouthwash if you wish. Do not swallow any toothpaste or mouthwash.  Do not wear jewelry, make-up, hairpins, clips or nail polish.  Do not wear lotions, powders, or perfumes.   Do not shave 48 hours prior to surgery.   Contact lenses, hearing aids and dentures may not be worn into  surgery.  Do not bring valuables to the hospital, including drivers license, insurance or credit cards.  [facility] is not responsible for any belongings or valuables.   Use CHG Soap as directed on instruction sheet.  Notify your doctor if there is any change in your medical condition (cold, fever, infection).  Wear comfortable clothing (specific to your surgery type) to the hospital.  If you are being discharged the day of surgery, you will not be allowed to drive home. You will need a responsible adult to drive you home and stay with you that night.   If you are taking public transportation, you will need to have a responsible adult with you. Please confirm with your physician that it is acceptable to use public transportation.   Please call [phone] if you have any questions about these instructions.  Visitation Policy:  Patients undergoing a surgery or procedure in a hospital may have one family member or support person with them as long as that person is not COVID-19 positive or experiencing its symptoms. That person may remain in the waiting area during the procedure. Should the patient need to stay at the hospital during part of their recovery, the support person may visit during visiting hours; 10 am to 8 pm.

## 2020-02-02 NOTE — Pre-Procedure Instructions (Signed)
Discussion with Dr. Ronelle Nigh (anesthesia) regarding patient's past history of tracheostomy and COPD. No new orders.

## 2020-02-03 ENCOUNTER — Other Ambulatory Visit: Payer: Medicaid Other

## 2020-02-03 LAB — SARS CORONAVIRUS 2 (TAT 6-24 HRS): SARS Coronavirus 2: NEGATIVE

## 2020-02-04 ENCOUNTER — Ambulatory Visit
Admission: RE | Admit: 2020-02-04 | Discharge: 2020-02-04 | Disposition: A | Discharge status: Home / Self Care | Payer: Medicaid Other | Source: Home / Self Care | Attending: Surgery | Admitting: Surgery

## 2020-02-04 ENCOUNTER — Ambulatory Visit: Payer: Medicaid Other

## 2020-02-04 ENCOUNTER — Ambulatory Visit: Payer: Medicaid Other | Admitting: Anesthesiology

## 2020-02-04 ENCOUNTER — Encounter
Admission: RE | Admit: 2020-02-04 | Discharge: 2020-02-04 | Disposition: A | Discharge status: Home / Self Care | Payer: Medicaid Other | Source: Ambulatory Visit | Attending: Surgery | Admitting: Surgery

## 2020-02-04 ENCOUNTER — Encounter
Admission: RE | Disposition: A | Discharge status: Home / Self Care | Payer: Self-pay | Source: Home / Self Care | Attending: Surgery

## 2020-02-04 ENCOUNTER — Ambulatory Visit
Admission: RE | Admit: 2020-02-04 | Discharge: 2020-02-04 | Disposition: A | Discharge status: Home / Self Care | Payer: Medicaid Other | Attending: Surgery | Admitting: Surgery

## 2020-02-04 ENCOUNTER — Other Ambulatory Visit: Payer: Self-pay

## 2020-02-04 ENCOUNTER — Encounter: Payer: Self-pay | Admitting: Surgery

## 2020-02-04 DIAGNOSIS — C50412 Malignant neoplasm of upper-outer quadrant of left female breast: Secondary | ICD-10-CM | POA: Diagnosis present

## 2020-02-04 DIAGNOSIS — J449 Chronic obstructive pulmonary disease, unspecified: Secondary | ICD-10-CM | POA: Diagnosis not present

## 2020-02-04 DIAGNOSIS — F17210 Nicotine dependence, cigarettes, uncomplicated: Secondary | ICD-10-CM | POA: Insufficient documentation

## 2020-02-04 DIAGNOSIS — Z882 Allergy status to sulfonamides status: Secondary | ICD-10-CM | POA: Diagnosis not present

## 2020-02-04 DIAGNOSIS — C50012 Malignant neoplasm of nipple and areola, left female breast: Secondary | ICD-10-CM

## 2020-02-04 DIAGNOSIS — C50212 Malignant neoplasm of upper-inner quadrant of left female breast: Secondary | ICD-10-CM

## 2020-02-04 DIAGNOSIS — Z79899 Other long term (current) drug therapy: Secondary | ICD-10-CM | POA: Insufficient documentation

## 2020-02-04 DIAGNOSIS — Z7951 Long term (current) use of inhaled steroids: Secondary | ICD-10-CM | POA: Diagnosis not present

## 2020-02-04 HISTORY — PX: BREAST LUMPECTOMY: SHX2

## 2020-02-04 HISTORY — PX: BREAST LUMPECTOMY WITH SENTINEL LYMPH NODE BIOPSY: SHX5597

## 2020-02-04 HISTORY — PX: PORTACATH PLACEMENT: SHX2246

## 2020-02-04 SURGERY — BREAST LUMPECTOMY WITH SENTINEL LYMPH NODE BX
Anesthesia: General | Site: Breast

## 2020-02-04 MED ORDER — SODIUM CHLORIDE 0.9 % IV SOLN
INTRAVENOUS | Status: DC | PRN
  Administered 2020-02-04: 12:00:00 25 ug/min via INTRAVENOUS

## 2020-02-04 MED ORDER — DEXAMETHASONE SODIUM PHOSPHATE 10 MG/ML IJ SOLN
INTRAMUSCULAR | Status: DC | PRN
  Administered 2020-02-04: 10 mg via INTRAVENOUS

## 2020-02-04 MED ORDER — LIDOCAINE HCL (CARDIAC) PF 100 MG/5ML IV SOSY
PREFILLED_SYRINGE | INTRAVENOUS | Status: DC | PRN
  Administered 2020-02-04: 60 mg via INTRAVENOUS

## 2020-02-04 MED ORDER — SODIUM CHLORIDE 0.9 % IV BOLUS
250.0000 mL | Freq: Once | INTRAVENOUS | Status: AC
  Administered 2020-02-04: 250 mL via INTRAVENOUS

## 2020-02-04 MED ORDER — ROCURONIUM BROMIDE 50 MG/5ML IV SOLN
INTRAVENOUS | Status: AC
  Filled 2020-02-04: qty 2

## 2020-02-04 MED ORDER — SUGAMMADEX SODIUM 200 MG/2ML IV SOLN
INTRAVENOUS | Status: AC
  Filled 2020-02-04: qty 2

## 2020-02-04 MED ORDER — PHENYLEPHRINE HCL (PRESSORS) 10 MG/ML IV SOLN
INTRAVENOUS | Status: DC | PRN
  Administered 2020-02-04: 100 ug via INTRAVENOUS
  Administered 2020-02-04: 200 ug via INTRAVENOUS

## 2020-02-04 MED ORDER — BUPIVACAINE-EPINEPHRINE (PF) 0.25% -1:200000 IJ SOLN
INTRAMUSCULAR | Status: DC | PRN
  Administered 2020-02-04: 30 mL

## 2020-02-04 MED ORDER — SUCCINYLCHOLINE CHLORIDE 20 MG/ML IJ SOLN
INTRAMUSCULAR | Status: AC
  Filled 2020-02-04: qty 1

## 2020-02-04 MED ORDER — FENTANYL CITRATE (PF) 100 MCG/2ML IJ SOLN
INTRAMUSCULAR | Status: AC
  Filled 2020-02-04: qty 2

## 2020-02-04 MED ORDER — FAMOTIDINE 20 MG PO TABS
ORAL_TABLET | ORAL | Status: AC
  Filled 2020-02-04: qty 1

## 2020-02-04 MED ORDER — ISOSULFAN BLUE 1 % ~~LOC~~ SOLN
SUBCUTANEOUS | Status: DC | PRN
  Administered 2020-02-04: 5 mL via SUBCUTANEOUS

## 2020-02-04 MED ORDER — FENTANYL CITRATE (PF) 100 MCG/2ML IJ SOLN: 25.0000 ug | INTRAMUSCULAR | Status: DC | PRN

## 2020-02-04 MED ORDER — SUGAMMADEX SODIUM 200 MG/2ML IV SOLN
INTRAVENOUS | Status: DC | PRN
  Administered 2020-02-04: 200 mg via INTRAVENOUS

## 2020-02-04 MED ORDER — CHLORHEXIDINE GLUCONATE CLOTH 2 % EX PADS
6.0000 | MEDICATED_PAD | Freq: Once | CUTANEOUS | Status: AC
  Administered 2020-02-04: 6 via TOPICAL

## 2020-02-04 MED ORDER — PROPOFOL 10 MG/ML IV BOLUS
INTRAVENOUS | Status: DC | PRN
  Administered 2020-02-04: 100 mg via INTRAVENOUS

## 2020-02-04 MED ORDER — LIDOCAINE HCL (PF) 2 % IJ SOLN
INTRAMUSCULAR | Status: AC
  Filled 2020-02-04: qty 10

## 2020-02-04 MED ORDER — ONDANSETRON HCL 4 MG/2ML IJ SOLN
INTRAMUSCULAR | Status: AC
  Filled 2020-02-04: qty 2

## 2020-02-04 MED ORDER — ONDANSETRON HCL 4 MG/2ML IJ SOLN
INTRAMUSCULAR | Status: DC | PRN
  Administered 2020-02-04 (×2): 4 mg via INTRAVENOUS

## 2020-02-04 MED ORDER — ROCURONIUM BROMIDE 100 MG/10ML IV SOLN
INTRAVENOUS | Status: DC | PRN
  Administered 2020-02-04 (×5): 10 mg via INTRAVENOUS
  Administered 2020-02-04: 40 mg via INTRAVENOUS

## 2020-02-04 MED ORDER — ACETAMINOPHEN 10 MG/ML IV SOLN
INTRAVENOUS | Status: AC
  Filled 2020-02-04: qty 100

## 2020-02-04 MED ORDER — LIDOCAINE HCL (PF) 2 % IJ SOLN
INTRAMUSCULAR | Status: AC
  Filled 2020-02-04: qty 5

## 2020-02-04 MED ORDER — EPHEDRINE SULFATE 50 MG/ML IJ SOLN
INTRAMUSCULAR | Status: DC | PRN
  Administered 2020-02-04 (×4): 5 mg via INTRAVENOUS

## 2020-02-04 MED ORDER — MIDAZOLAM HCL 2 MG/2ML IJ SOLN
INTRAMUSCULAR | Status: DC | PRN
  Administered 2020-02-04: 2 mg via INTRAVENOUS

## 2020-02-04 MED ORDER — CEFAZOLIN SODIUM-DEXTROSE 2-4 GM/100ML-% IV SOLN
2.0000 g | INTRAVENOUS | Status: AC
  Administered 2020-02-04: 2 g via INTRAVENOUS

## 2020-02-04 MED ORDER — PROMETHAZINE HCL 25 MG/ML IJ SOLN
INTRAMUSCULAR | Status: AC
  Filled 2020-02-04: qty 1

## 2020-02-04 MED ORDER — ISOSULFAN BLUE 1 % ~~LOC~~ SOLN
SUBCUTANEOUS | Status: AC
  Filled 2020-02-04: qty 5

## 2020-02-04 MED ORDER — OXYCODONE HCL 5 MG PO TABS: 5.0000 mg | ORAL_TABLET | Freq: Once | ORAL | Status: DC | PRN

## 2020-02-04 MED ORDER — FAMOTIDINE 20 MG PO TABS
20.0000 mg | ORAL_TABLET | Freq: Once | ORAL | Status: AC
  Administered 2020-02-04: 20 mg via ORAL

## 2020-02-04 MED ORDER — FENTANYL CITRATE (PF) 100 MCG/2ML IJ SOLN
INTRAMUSCULAR | Status: DC | PRN
  Administered 2020-02-04 (×2): 25 ug via INTRAVENOUS
  Administered 2020-02-04: 50 ug via INTRAVENOUS
  Administered 2020-02-04 (×2): 25 ug via INTRAVENOUS
  Administered 2020-02-04: 50 ug via INTRAVENOUS

## 2020-02-04 MED ORDER — VASOPRESSIN 20 UNIT/ML IV SOLN
INTRAVENOUS | Status: DC | PRN
  Administered 2020-02-04 (×4): 2 [IU] via INTRAVENOUS

## 2020-02-04 MED ORDER — SODIUM CHLORIDE FLUSH 0.9 % IV SOLN
INTRAVENOUS | Status: AC
  Filled 2020-02-04: qty 10

## 2020-02-04 MED ORDER — TECHNETIUM TC 99M SULFUR COLLOID FILTERED
1.0000 | Freq: Once | INTRAVENOUS | Status: AC | PRN
  Administered 2020-02-04: 10:00:00 0.697 via INTRADERMAL

## 2020-02-04 MED ORDER — GLYCOPYRROLATE 0.2 MG/ML IJ SOLN
INTRAMUSCULAR | Status: AC
  Filled 2020-02-04: qty 2

## 2020-02-04 MED ORDER — CEFAZOLIN SODIUM-DEXTROSE 2-4 GM/100ML-% IV SOLN
INTRAVENOUS | Status: AC
  Filled 2020-02-04: qty 100

## 2020-02-04 MED ORDER — PROPOFOL 10 MG/ML IV BOLUS
INTRAVENOUS | Status: AC
  Filled 2020-02-04: qty 40

## 2020-02-04 MED ORDER — OXYCODONE HCL 5 MG PO TABS: 5.0000 mg | ORAL_TABLET | ORAL | 0 refills | Status: DC | PRN

## 2020-02-04 MED ORDER — DEXAMETHASONE SODIUM PHOSPHATE 10 MG/ML IJ SOLN
INTRAMUSCULAR | Status: AC
  Filled 2020-02-04: qty 2

## 2020-02-04 MED ORDER — PROMETHAZINE HCL 25 MG/ML IJ SOLN
6.2500 mg | INTRAMUSCULAR | Status: DC | PRN
  Administered 2020-02-04: 6.25 mg via INTRAVENOUS

## 2020-02-04 MED ORDER — GLYCOPYRROLATE 0.2 MG/ML IJ SOLN
INTRAMUSCULAR | Status: DC | PRN
  Administered 2020-02-04: .2 mg via INTRAVENOUS

## 2020-02-04 MED ORDER — ROCURONIUM BROMIDE 50 MG/5ML IV SOLN
INTRAVENOUS | Status: AC
  Filled 2020-02-04: qty 3

## 2020-02-04 MED ORDER — LACTATED RINGERS IV SOLN: INTRAVENOUS | Status: DC

## 2020-02-04 MED ORDER — SODIUM CHLORIDE 0.9 % IV SOLN
INTRAVENOUS | Status: DC | PRN
  Administered 2020-02-04: 5 mL via INTRAMUSCULAR

## 2020-02-04 MED ORDER — KETOROLAC TROMETHAMINE 30 MG/ML IJ SOLN
INTRAMUSCULAR | Status: DC | PRN
  Administered 2020-02-04: 30 mg via INTRAVENOUS

## 2020-02-04 MED ORDER — ONDANSETRON HCL 4 MG/2ML IJ SOLN
INTRAMUSCULAR | Status: AC
  Filled 2020-02-04: qty 6

## 2020-02-04 MED ORDER — MIDAZOLAM HCL 2 MG/2ML IJ SOLN
INTRAMUSCULAR | Status: AC
  Filled 2020-02-04: qty 2

## 2020-02-04 MED ORDER — OXYCODONE HCL 5 MG/5ML PO SOLN: 5.0000 mg | Freq: Once | ORAL | Status: DC | PRN

## 2020-02-04 SURGICAL SUPPLY — 69 items
"PENCIL ELECTRO HAND CTR " (MISCELLANEOUS) IMPLANT
APPLIER CLIP 9.375 SM OPEN (CLIP) ×6
BAG DECANTER FOR FLEXI CONT (MISCELLANEOUS) ×3 IMPLANT
BLADE SURG 15 STRL LF DISP TIS (BLADE) ×2 IMPLANT
BLADE SURG 15 STRL SS (BLADE) ×2
BLADE SURG SZ11 CARB STEEL (BLADE) ×4 IMPLANT
BOOT SUTURE AID YELLOW STND (SUTURE) ×3 IMPLANT
CANISTER SUCT 1200ML W/VALVE (MISCELLANEOUS) ×3 IMPLANT
CHLORAPREP W/TINT 26 (MISCELLANEOUS) ×3 IMPLANT
CLIP APPLIE 9.375 SM OPEN (CLIP) IMPLANT
CNTNR SPEC 2.5X3XGRAD LEK (MISCELLANEOUS) ×4
CONT SPEC 4OZ STER OR WHT (MISCELLANEOUS) ×2
CONTAINER SPEC 2.5X3XGRAD LEK (MISCELLANEOUS) ×4 IMPLANT
COVER LIGHT HANDLE STERIS (MISCELLANEOUS) ×6 IMPLANT
COVER PROBE FLX POLY STRL (MISCELLANEOUS) ×1 IMPLANT
COVER WAND RF STERILE (DRAPES) ×3 IMPLANT
DERMABOND ADVANCED (GAUZE/BANDAGES/DRESSINGS) ×3
DERMABOND ADVANCED .7 DNX12 (GAUZE/BANDAGES/DRESSINGS) ×2 IMPLANT
DRAPE 3/4 80X56 (DRAPES) ×3 IMPLANT
DRAPE C-ARM XRAY 36X54 (DRAPES) ×6 IMPLANT
DRAPE INCISE IOBAN 66X45 STRL (DRAPES) ×3 IMPLANT
DRAPE LAPAROTOMY TRNSV 106X77 (MISCELLANEOUS) ×3 IMPLANT
ELECT CAUTERY BLADE 6.4 (BLADE) ×4 IMPLANT
ELECT REM PT RETURN 9FT ADLT (ELECTROSURGICAL) ×3
ELECTRODE REM PT RTRN 9FT ADLT (ELECTROSURGICAL) ×2 IMPLANT
GEL ULTRASOUND 20GR AQUASONIC (MISCELLANEOUS) ×3 IMPLANT
GLOVE BIO SURGEON STRL SZ7 (GLOVE) ×7 IMPLANT
GLOVE INDICATOR 7.5 STRL GRN (GLOVE) ×5 IMPLANT
GOWN STRL REUS W/ TWL LRG LVL3 (GOWN DISPOSABLE) ×4 IMPLANT
GOWN STRL REUS W/TWL LRG LVL3 (GOWN DISPOSABLE) ×6
HANDLE YANKAUER SUCT BULB TIP (MISCELLANEOUS) ×1 IMPLANT
IV NS 500ML (IV SOLUTION) ×1
IV NS 500ML BAXH (IV SOLUTION) ×2 IMPLANT
KIT MARKER MARGIN INK (KITS) ×1 IMPLANT
KIT PORT POWER 8FR ISP CVUE (Port) ×3 IMPLANT
KIT TURNOVER KIT A (KITS) ×3 IMPLANT
LABEL OR SOLS (LABEL) ×3 IMPLANT
MARGIN MAP 10MM (MISCELLANEOUS) IMPLANT
MARKER MARGIN CORRECT CLIP (MARKER) IMPLANT
NDL FILTER BLUNT 18X1 1/2 (NEEDLE) ×2 IMPLANT
NEEDLE FILTER BLUNT 18X 1/2SAF (NEEDLE) ×1
NEEDLE FILTER BLUNT 18X1 1/2 (NEEDLE) ×2 IMPLANT
NEEDLE HYPO 22GX1.5 SAFETY (NEEDLE) ×3 IMPLANT
NS IRRIG 1000ML POUR BTL (IV SOLUTION) ×3 IMPLANT
PACK BASIN MINOR ARMC (MISCELLANEOUS) ×3 IMPLANT
PACK PORT-A-CATH (MISCELLANEOUS) ×3 IMPLANT
PENCIL ELECTRO HAND CTR (MISCELLANEOUS) ×3 IMPLANT
SPONGE KITTNER 5P (MISCELLANEOUS) IMPLANT
SPONGE LAP 18X18 RF (DISPOSABLE) ×4 IMPLANT
SUT MNCRL 4-0 (SUTURE) ×2
SUT MNCRL 4-0 27XMFL (SUTURE) ×4
SUT MNCRL AB 4-0 PS2 18 (SUTURE) ×3 IMPLANT
SUT PROLENE 2 0 SH DA (SUTURE) ×1 IMPLANT
SUT PROLENE 2-0 (SUTURE) ×1
SUT PROLENE 2-0 RB1 36X2 ARM (SUTURE) ×2
SUT SILK 2 0 SH (SUTURE) ×3 IMPLANT
SUT VIC AB 2-0 SH 27 (SUTURE) ×1
SUT VIC AB 2-0 SH 27XBRD (SUTURE) IMPLANT
SUT VIC AB 3-0 SH 27 (SUTURE) ×2
SUT VIC AB 3-0 SH 27X BRD (SUTURE) ×4 IMPLANT
SUTURE MNCRL 4-0 27XMF (SUTURE) ×4 IMPLANT
SUTURE PROLEN 2-0 RB1 36X2 ARM (SUTURE) ×2 IMPLANT
SYR 10ML LL (SYRINGE) ×3 IMPLANT
SYR 20ML LL LF (SYRINGE) ×3 IMPLANT
SYR 5ML LL (SYRINGE) ×3 IMPLANT
TAPE TRANSPORE STRL 2 31045 (GAUZE/BANDAGES/DRESSINGS) ×1 IMPLANT
TOWEL OR 17X26 4PK STRL BLUE (TOWEL DISPOSABLE) ×3 IMPLANT
TUBING CONNECTING 10 (TUBING) ×1 IMPLANT
WATER STERILE IRR 1000ML POUR (IV SOLUTION) ×3 IMPLANT

## 2020-02-04 NOTE — Discharge Instructions (Addendum)
AMBULATORY SURGERY  DISCHARGE INSTRUCTIONS   1) The drugs that you were given will stay in your system until tomorrow so for the next 24 hours you should not:  A) Drive an automobile B) Make any legal decisions C) Drink any alcoholic beverage   2) You may resume regular meals tomorrow.  Today it is better to start with liquids and gradually work up to solid foods.  You may eat anything you prefer, but it is better to start with liquids, then soup and crackers, and gradually work up to solid foods.   3) Please notify your doctor immediately if you have any unusual bleeding, trouble breathing, redness and pain at the surgery site, drainage, fever, or pain not relieved by medication.    4) Additional Instructions:place a small soft pillow between breast and arm. You may use ice or heat over surgical site. Make sure to have something between skin and hot/cold. Take pain medication as needed. Have food in your stomach prior to taking.  If you are taking pain medication regularly, please add a stool softener to your medications.        Please contact your physician with any problems or Same Day Surgery at [phone], Monday through Friday 6 am to 4 pm, or  at [facility] number at [phone].Sentinel Lymph Node Biopsy in Breast Cancer Treatment Sentinel lymph node biopsy is a procedure to identify, remove, and examine one or more lymph nodes for cancer. Lymph is fluid from the tissues in the body. It is removed through the lymphatic system. This system is part of the body's defense system (immune system) and includes lymph nodes and lymph vessels. Cancer can spread to nearby lymph nodes. It usually spreads to one lymph node first, and then to others. The first lymph node that the cancer could spread to is called the sentinel lymph node. In some cases, there may be more than one sentinel lymph node. If you have breast cancer, you may have this procedure to determine whether  your cancer has spread. For breast cancer, a sentinel lymph node is usually in the armpit because that is where breast cancer tends to spread first. If no cancer is found in a sentinel lymph node, it is very unlikely that the cancer has spread to any of the other lymph nodes. If cancer is found in a sentinel lymph node, your surgeon may remove additional lymph nodes for examination. Tell a health care provider about:  Any allergies you have.  All medicines you are taking, including vitamins, herbs, eye drops, creams, and over-the-counter medicines.  Any problems you or family members have had with anesthetic medicines.  Any blood disorders you have.  Any surgeries you have had.  Any medical conditions you have.  Whether you are pregnant or may be pregnant. What are the risks? Generally, this is a safe procedure. However, problems may occur, including:  Infection.  Allergic reactions to medicines or dyes.  Staining of the skin where the dye is injected.  Damaged lymph vessels, causing a buildup of fluid (lymphedema).  Damage to nearby structures or organs.  Pain, bleeding, or bruising where a lymph node was removed (biopsy site).  A false-negative biopsy. This is when cancer cells are not found in the sentinel node, but the cancer has spread to other nodes in the area. What happens before the procedure? Staying hydrated Follow instructions from your health care provider about hydration, which may include:  Up to 2 hours before the procedure -  you may continue to drink clear liquids, such as water, clear fruit juice, black coffee, and plain tea.  Eating and drinking restrictions Follow instructions from your health care provider about eating and drinking, which may include:  8 hours before the procedure - stop eating heavy meals or foods, such as meat, fried foods, or fatty foods.  6 hours before the procedure - stop eating light meals or foods, such as toast or cereal.  6  hours before the procedure - stop drinking milk or drinks that contain milk.  2 hours before the procedure - stop drinking clear liquids. Medicines Ask your health care provider about:  Changing or stopping your regular medicines. This is especially important if you are taking diabetes medicines or blood thinners.  Taking medicines such as aspirin and ibuprofen. These medicines can thin your blood. Do not take these medicines unless your health care provider tells you to take them.  Taking over-the-counter medicines, vitamins, herbs, and supplements. General instructions  Do not use any products that contain nicotine or tobacco for at least 4 weeks before the procedure. These products include cigarettes, e-cigarettes, and chewing tobacco. If you need help quitting, ask your health care provider.  You may have blood tests to make sure your blood clots normally.  You may have screening tests or exams to get baseline measurements of your arm. These can be compared to measurements done after surgery to monitor for swelling (lymphedema) that can develop after having lymph nodes removed.  Plan to have someone take you home from the hospital or clinic.  Ask your health care provider: ? How your surgery site will be marked. ? What steps will be taken to help prevent infection. These may include:  Washing skin with a germ-killing soap.  Taking antibiotic medicine. What happens during the procedure?   An IV will be inserted into one of your veins.  You will be given one or more of the following: ? A medicine to help you relax (sedative). ? A medicine to numb the area (local anesthetic). ? A medicine to make you fall asleep (general anesthetic).  Blue dye, a radioactive substance, or both will be injected around the tumor in your breast. Both the dye and the radioactive substance will follow the same path that a spreading cancer would be likely to follow. ? The blue dye will reach your  lymph node quickly. It may be given just before surgery. ? The radioactive substance will take longer to reach your lymph nodes, so it may be given before you go into the operating area. A scanner will show where the substance has spread. This will help identify the sentinel lymph node.  The surgeon will make a small incision. If blue dye was injected, your surgeon will look for any lymph nodes that have picked up the dye.  Sentinel lymph nodes will be removed and sent to a lab for examination. ? If no cancer is found, no other lymph nodes will be removed. This means it is unlikely that the cancer has spread to other lymph nodes. ? If cancer is found, the surgeon will remove other lymph nodes in the armpit for examination. This may happen during the same procedure or at a later time.  The incision will be closed with stitches (sutures) or metal clips.  Small adhesive bandages may be used to keep the skin edges close together.  A small dressing may be taped over the incision area. The procedure may vary among health care  providers and hospitals. What happens after the procedure?  Your blood pressure, heart rate, breathing rate, and blood oxygen level will be monitored until you leave the hospital or clinic.  For the next 24 hours, your urine may be blue, and your stool may be darker. This is normal. It is caused by the dye that is used during the procedure.  Your skin at the injection site may be blue for up to 8 weeks.  It is up to you to get the results of your procedure. Ask your health care provider, or the department that is doing the procedure, when your results will be ready. Summary  Sentinel lymph node biopsy is a procedure to identify, remove, and examine one or more lymph nodes for cancer.  If you have breast cancer, you may have this procedure to determine whether your cancer has spread.  If cancer is found in a sentinel lymph node, your surgeon may remove additional lymph  nodes for examination. This information is not intended to replace advice given to you by your health care provider. Make sure you discuss any questions you have with your health care provider. Document Revised: 07/12/2019 Document Reviewed: 07/12/2019 Elsevier Patient Education  [place].

## 2020-02-04 NOTE — Anesthesia Procedure Notes (Signed)
Procedure Name: Intubation Performed by: Kelton Pillar, CRNA Pre-anesthesia Checklist: Patient identified, Emergency Drugs available, Suction available and Patient being monitored Patient Re-evaluated:Patient Re-evaluated prior to induction Oxygen Delivery Method: Circle system utilized Induction Type: IV induction Ventilation: Mask ventilation without difficulty Laryngoscope Size: McGraph and 3 Grade View: Grade I Tube type: Oral Tube size: 6.5 mm Number of attempts: 1 Airway Equipment and Method: Stylet Placement Confirmation: ETT inserted through vocal cords under direct vision,  positive ETCO2,  CO2 detector and breath sounds checked- equal and bilateral Secured at: 20 cm Tube secured with: Tape Dental Injury: Teeth and Oropharynx as per pre-operative assessment

## 2020-02-04 NOTE — Transfer of Care (Signed)
Immediate Anesthesia Transfer of Care Note  [name]  Procedure(s) Performed: BREAST LUMPECTOMY WITH SENTINEL LYMPH NODE BX (Left Breast) INSERTION PORT-A-CATH (N/A )  Patient Location: PACU  Anesthesia Type:General  Level of Consciousness: awake, alert , oriented and patient cooperative  Airway & Oxygen Therapy: Patient Spontanous Breathing and Patient connected to face mask oxygen  Post-op Assessment: Report given to RN and Post -op Vital signs reviewed and stable  Post vital signs: Reviewed and stable  Last Vitals:  Vitals Value Taken Time  BP 125/76 02/04/20 1413  Temp    Pulse 91 02/04/20 1421  Resp 12 02/04/20 1421  SpO2 98 % 02/04/20 1421  Vitals shown include unvalidated device data.  Last Pain:  Vitals:   02/04/20 1044  TempSrc: Temporal         Complications: No apparent anesthesia complications

## 2020-02-04 NOTE — Anesthesia Preprocedure Evaluation (Signed)
Anesthesia Evaluation  Patient identified by MRN, date of birth, ID band Patient awake    Reviewed: Allergy & Precautions, H&P , NPO status , Patient's Chart, lab work & pertinent test results  Airway Mallampati: II  TM Distance: >3 FB Neck ROM: full    Dental  (+) Chipped   Pulmonary neg sleep apnea, COPD,  COPD inhaler, Current Smoker and Patient abstained from smoking.,    breath sounds clear to auscultation       Cardiovascular hypertension, On Medications (-) angina(-) Past MI and (-) Cardiac Stents (-) dysrhythmias  Rhythm:regular Rate:Normal     Neuro/Psych Depression negative neurological ROS  negative psych ROS   GI/Hepatic negative GI ROS, Neg liver ROS,   Endo/Other  negative endocrine ROS  Renal/GU Kidney stones     Musculoskeletal   Abdominal   Peds  Hematology negative hematology ROS (+)   Anesthesia Other Findings Breast cancer  Past Medical History: No date: Arthritis No date: Cellulitis No date: COPD (chronic obstructive pulmonary disease) (HCC) No date: Depression No date: History of kidney stones 2000: History of tracheostomy     Comment:  stated that epiglottis swelled up for an unknown reason;              had trach for about a week.  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 No date: FRACTURE SURGERY No date: kidney stones No date: lymphnode neck No date: TYMPANOSTOMY TUBE PLACEMENT     Reproductive/Obstetrics negative OB ROS                             Anesthesia Physical Anesthesia Plan  ASA: II  Anesthesia Plan: General ETT   Post-op Pain Management:    Induction:   PONV Risk Score and Plan: Ondansetron, Dexamethasone, Midazolam and Treatment may vary due to age or medical condition  Airway Management Planned:   Additional Equipment:    Intra-op Plan:   Post-operative Plan:   Informed Consent: I have reviewed the patients History and Physical, chart, labs and discussed the procedure including the risks, benefits and alternatives for the proposed anesthesia with the patient or authorized representative who has indicated his/her understanding and acceptance.     Dental Advisory Given  Plan Discussed with: Anesthesiologist  Anesthesia Plan Comments:         Anesthesia Quick Evaluation

## 2020-02-04 NOTE — Op Note (Signed)
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 Freda Jackson. Required for adequate exposure and due to the unavailability of a qualified certified first assist  Anesthesia: GETA  Procedure:  1. Left Partial mastectomy with Margins and Axillary sentinel lymph node biopsy 2. Tissue rearrangement for breast deformity  Measuring 5.5x6cms 3. Right IJ  Port placement with fluoroscopic and under U/S guidance  Findings: Xray specimen with good margins and clip in the center Gross margins negative Good position of the tip of the catheter by fluoroscopy  Estimated Blood Loss: 10cc         Drains: None         Specimens: None       Complications: none    Procedure Details  The patient was seen again in the Holding Room. The benefits, complications, treatment options, and expected outcomes were discussed with the patient. The risks of bleeding, infection, recurrence of symptoms, failure to resolve symptoms,  thrombosis nonfunction breakage pneumothorax hemopneumothorax any of which could require chest tube or further surgery were reviewed with the patient.   The patient was taken to Operating Room, identified as Deanna Ramsey and the procedure verified.  A Time Out was held and the above information confirmed.  Prior to the induction of general anesthesia, antibiotic prophylaxis was administered. VTE prophylaxis was in place. Appropriate anesthesia was then administered and tolerated well. The chest, neck and Left axilla  wall  was prepped with Chloraprep and draped in the sterile fashion.  5 cc of isosulfan blue was injected in the periareolar area and massaged for a few minutes. Attention then was turned to the axilla where the probe was used to identify the area of uptake.  We marked the area within the axilla and use a 15 blade knife to create an incision.  Dr. Eulas Post was  used to dissect through the cutaneous tissue and about 4 lymph nodes were identified they were all blue and with increased radioactivity.  There were dissected in the standard fashion clipping the lymphatic channels feeding it.  Adequate hemostasis was obtained.  The specimen was sent for permanent pathology.  The wound was closed in a 2 layer fashion with 3-0 Vicryl and 4-0 Monocryl.  Attention then was turned to the left breast where the palpable mass was identified.  Using a crescent incision we made sure that we obtain good anterior margin with a good skin margin.  The flaps were developed inferiorly and superiorly and circumferentially around the mass.  Are deep margins worse the chest wall and the pectoralis muscle. We excised the mass with good circumferential margins.  Please note that the mass was very close to the skin as well to the pectoralis but we went as deep as possible and obviously as anteriorly as possible.  The specimen was oriented with paint and wound and sent for x-ray.  The x-ray specimen show a great margin and the clip within the specimen.  Due to the defect that was left after the lumpectomy I decided to perform a mastoplasty.  We created flaps circumferentially freeing the pectoralis muscle from adjacent breast tissue.  We then were able to approximate the flaps in a 2 layer fashion with 2-0 Vicryl for the lip there and 3-0 Vicryl for the superficial layer.  Skin was closed with a formal Monocryl in a subcuticular fashion.  We were able to switch trace changed gloves and gown  and then our attention was turned to the right neck.  Then the patient was placed in Trendelenburg position. The large bore needle was placed into the internal jugular vein under U/S guidance without difficulty and then the Seldinger wire was advanced. Fluoroscopy was utilized to confirm that the Seldinger wire was in the superior vena cava.  An incision was made and a port pocket developed with blunt and  electrocautery dissection. The introducer dilator was placed over the Seldinger wire the wire was removed. The previously flushed catheter was placed into the introducer dilator and the peel-away sheath was removed. The catheter length was confirmed and trimmed utilizing fluoroscopy for proper positioning. The catheter was then attached to the previously flushed port. The port was placed into the pocket. The port was held in with 2-0 Prolenes and flushed for function and heparin locked.  The wound was closed with interrupted 3-0 Vicryl followed by 4-0 subcuticular Monocryl sutures. Dermabond used to coat the skin  Patient was taken to the recovery room in stable condition where a postoperative chest film has been ordered.

## 2020-02-04 NOTE — Interval H&P Note (Signed)
History and Physical Interval Note:  02/04/2020 11:01 AM  Deanna Ramsey  has presented today for surgery, with the diagnosis of Breast Cancer.  The various methods of treatment have been discussed with the patient and family. After consideration of risks, benefits and other options for treatment, the patient has consented to  Procedure(s): BREAST LUMPECTOMY WITH SENTINEL LYMPH NODE BX (Left) INSERTION PORT-A-CATH (N/A) as a surgical intervention.  The patient's history has been reviewed, patient examined, no change in status, stable for surgery.  I have reviewed the patient's chart and labs.  Questions were answered to the patient's satisfaction.   Given triple Negativity we will place port as well. D/W the pt in detail and she understands  [place]

## 2020-02-08 ENCOUNTER — Other Ambulatory Visit: Payer: Self-pay

## 2020-02-08 ENCOUNTER — Other Ambulatory Visit: Payer: Self-pay | Admitting: Anatomic Pathology & Clinical Pathology

## 2020-02-08 LAB — SURGICAL PATHOLOGY

## 2020-02-09 NOTE — Anesthesia Postprocedure Evaluation (Signed)
Anesthesia Post Note  [name]  Procedure(s) Performed: BREAST LUMPECTOMY WITH SENTINEL LYMPH NODE BX (Left Breast) INSERTION PORT-A-CATH (N/A )  Patient location during evaluation: PACU Anesthesia Type: General Level of consciousness: awake and alert Pain management: pain level controlled Vital Signs Assessment: post-procedure vital signs reviewed and stable Respiratory status: spontaneous breathing, nonlabored ventilation, respiratory function stable and patient connected to nasal cannula oxygen Cardiovascular status: blood pressure returned to baseline and stable Postop Assessment: no apparent nausea or vomiting Anesthetic complications: no     Last Vitals:  Vitals:   02/04/20 1600 02/04/20 1627  BP: 109/63 106/66  Pulse: 79 80  Resp: 14 16  Temp: 36.8 C   SpO2: 100% 96%    Last Pain:  Vitals:   02/04/20 1627  TempSrc:   PainSc: 0-No pain                 Molli Barrows

## 2020-02-11 ENCOUNTER — Telehealth: Payer: Self-pay | Admitting: Surgery

## 2020-02-11 NOTE — Telephone Encounter (Signed)
Called pt about path results. D/W her about margin positivity. She wishes to  Further discuss with me in person next Wednesday. Usually we do re excision for positive margins. She wishes to think about it and have a discussion with me next week

## 2020-02-13 NOTE — Progress Notes (Signed)
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  INTERVAL HISTORY: Patient returns to clinic today for further evaluation and discussion of her pathology results.  She tolerated her lumpectomy well without significant side effects.  She was noted to have positive margins.  She currently feels well.  She has no neurologic complaints.  She denies any recent fevers or illnesses.  She has good appetite and denies weight loss.  She has no chest pain, shortness of breath, cough, or hemoptysis.  She denies any nausea, vomiting, constipation, or diarrhea.  She has no urinary complaints.  Patient offers no specific complaints today.  REVIEW OF SYSTEMS:   Review of Systems  Constitutional: Negative.  Negative for fever, malaise/fatigue and weight loss.  Respiratory: Negative.  Negative for cough, hemoptysis and shortness of breath.   Cardiovascular: Negative.  Negative for chest pain and leg swelling.  Gastrointestinal: Negative.  Negative for abdominal pain.  Genitourinary: Negative.  Negative for dysuria.  Musculoskeletal: Negative.  Negative for back pain.  Skin: Negative.  Negative for rash.  Neurological: Negative.  Negative for dizziness, focal weakness, weakness and headaches.  Psychiatric/Behavioral: Negative.  The patient is not nervous/anxious.     As per HPI. Otherwise, a complete review of systems is negative.  PAST MEDICAL HISTORY: Past Medical History:  Diagnosis Date  . Arthritis   . Cellulitis   . COPD (chronic obstructive pulmonary disease) ([place])   . Depression   . History of kidney stones   .  History of tracheostomy 2000   stated that epiglottis swelled up for an unknown reason; had trach for about a week.   . Hypertension   . Pneumonia     PAST SURGICAL HISTORY: Past Surgical History:  Procedure Laterality Date  . arm surgery  1984   fractured ulna  . BREAST LUMPECTOMY WITH SENTINEL LYMPH NODE BIOPSY Left 02/04/2020   Procedure: BREAST LUMPECTOMY WITH SENTINEL LYMPH NODE BX;  Surgeon: Jules Husbands, MD;  Location: [facility] ORS;  Service: General;  Laterality: Left;  . CLEFT LIP REPAIR    . FRACTURE SURGERY    . kidney stones    . lymphnode neck    . PORTACATH PLACEMENT N/A 02/04/2020   Procedure: INSERTION PORT-A-CATH;  Surgeon: Jules Husbands, MD;  Location: [facility] ORS;  Service: General;  Laterality: N/A;  . TYMPANOSTOMY TUBE PLACEMENT      FAMILY HISTORY: Family History  Problem Relation Age of Onset  . COPD Other   . COPD Mother   . Hypertension Mother   . COPD Father   . Breast cancer Neg Hx     ADVANCED DIRECTIVES (Y/N):  N  HEALTH MAINTENANCE: Social History   Tobacco Use  . Smoking status: Current Every Day Smoker    Packs/day: 1.00    Types: Cigarettes  . Smokeless tobacco: Never Used  Substance Use Topics  . Alcohol use: No  . Drug use: No     Colonoscopy:  PAP:  Bone density:  Lipid panel:  Allergies  Allergen Reactions  . Sulfa Antibiotics Nausea And Vomiting    Current Outpatient Medications  Medication Sig Dispense Refill  . acetaminophen (TYLENOL)  325 MG tablet Take 650 mg by mouth every 6 (six) hours as needed for moderate pain.    Marland Kitchen albuterol (PROVENTIL HFA;VENTOLIN HFA) 108 (90 Base) MCG/ACT inhaler Inhale 2 puffs into the lungs every 6 (six) hours as needed for wheezing or shortness of breath. 1 Inhaler 2  . cetirizine (ZYRTEC) 10 MG tablet Take 10 mg by mouth daily.    . fluticasone (FLOVENT HFA) 110 MCG/ACT inhaler Inhale 2 puffs into the lungs daily.     Marland Kitchen lisinopril-hydrochlorothiazide (ZESTORETIC) 20-12.5 MG tablet Take 1  tablet by mouth daily.     No current facility-administered medications for this visit.    OBJECTIVE: Vitals:   02/16/20 1018  BP: (!) 146/92  Pulse: (!) 110  Resp: 18  Temp: (!) 96.6 F (35.9 C)     Body mass index is 16.88 kg/m.    ECOG FS:0 - Asymptomatic  General: Well-developed, well-nourished, no acute distress. Eyes: Pink conjunctiva, anicteric sclera. HEENT: Normocephalic, moist mucous membranes. Breast: Exam deferred today. Lungs: No audible wheezing or coughing. Heart: Regular rate and rhythm. Abdomen: Soft, nontender, no obvious distention. Musculoskeletal: No edema, cyanosis, or clubbing. Neuro: Alert, answering all questions appropriately. Cranial nerves grossly intact. Skin: No rashes or petechiae noted. Psych: Normal affect.   LAB RESULTS:  Lab Results  Component Value Date   NA 138 02/02/2020   K 4.6 02/02/2020   CL 104 02/02/2020   CO2 23 02/02/2020   GLUCOSE 134 (H) 02/02/2020   BUN 36 (H) 02/02/2020   CREATININE 1.28 (H) 02/02/2020   CALCIUM 9.5 02/02/2020   PROT 8.2 (H) 02/02/2020   ALBUMIN 4.7 02/02/2020   AST 19 02/02/2020   ALT 11 02/02/2020   ALKPHOS 85 02/02/2020   BILITOT 0.6 02/02/2020   GFRNONAA 48 (L) 02/02/2020   GFRAA 55 (L) 02/02/2020    Lab Results  Component Value Date   WBC 7.5 02/02/2020   NEUTROABS 4.5 02/02/2020   HGB 14.6 02/02/2020   HCT 44.7 02/02/2020   MCV 97.0 02/02/2020   PLT 386 02/02/2020     STUDIES: DG Chest 1 View  Result Date: 02/04/2020 CLINICAL DATA:  Port-A-Cath placement. EXAM: CHEST  1 VIEW COMPARISON:  Chest x-ray 07/02/2018 FINDINGS: Right IJ power port is in good position. The tip is in the distal SVC near the cavoatrial junction. No complicating features are identified. The lungs are clear. No pulmonary lesions or pleural effusion. Surgical changes involving the left breast and axilla. IMPRESSION: Right IJ power port in good position without complicating features. Electronically Signed   By:  Marijo Sanes M.D.   On: 02/04/2020 15:21   NM SENTINEL NODE INJECTION  Result Date: 02/04/2020 CLINICAL DATA:  Left breast cancer. EXAM: NUCLEAR MEDICINE BREAST LYMPHOSCINTIGRAPHY LEFT BREAST TECHNIQUE: Intradermal injection of radiopharmaceutical was performed at the 12 o'clock, 3 o'clock, 6 o'clock, and 9 o'clock positions around the left nipple. The patient was then sent to the operating room where the sentinel node(s) were identified and removed by the surgeon. RADIOPHARMACEUTICALS:  Total of 0.7 mCi Millipore-filtered Technetium-90msulfur colloid, injected in four aliquots of 0.25 mCi each. IMPRESSION: Uncomplicated intradermal injection of a total of 0.7 mCi Technetium-9105mulfur colloid for purposes of sentinel node identification. Electronically Signed   By: ThMarcello MooresRegister   On: 02/04/2020 11:14   MM BREAST SURGICAL SPECIMEN  Result Date: 02/14/2020 CLINICAL DATA:  Status post excision of LEFT breast mass. EXAM: SPECIMEN RADIOGRAPH OF THE LEFT BREAST COMPARISON:  Previous exam(s). FINDINGS: Status post excision  of the LEFT breast. The coil shaped biopsy marker clip is present and marked for pathology. Findings are discussed with Dr. Dahlia Byes. IMPRESSION: Specimen radiograph of the left breast. Electronically Signed   By: Nolon Nations M.D.   On: 02/14/2020 08:57   DG C-Arm 1-60 Min-No Report  Result Date: 02/04/2020 Fluoroscopy was utilized by the requesting physician.  No radiographic interpretation.   US BREAST LTD UNI LEFT INC AXILLA  Addendum Date: 01/19/2020   ADDENDUM REPORT: 01/19/2020 14:44 ADDENDUM: When the patient return for her left breast and left axillary node biopsies on today, 01/19/2020, she asked that I not perform a biopsy of the left axillary lymph node. She was anxious about this procedure, and stated she had a bad experience with an axillary procedure in the past. Therefore, only the left breast biopsy was performed today. Electronically Signed   By: Curlene Dolphin  M.D.   On: 01/19/2020 14:44   Result Date: 01/19/2020 CLINICAL DATA:  54 year old female with a palpable area of concern in the left breast. EXAM: DIGITAL DIAGNOSTIC BILATERAL MAMMOGRAM WITH CAD AND TOMO LEFT BREAST ULTRASOUND COMPARISON:  Previous exam(s). ACR Breast Density Category d: The breast tissue is extremely dense, which lowers the sensitivity of mammography. FINDINGS: No suspicious masses or calcifications are seen in the right breast. Spot compression tomograms were performed over the palpable area of concern in the far outer left breast demonstrating a mass with associated distortion partially visualized. Mammographic images were processed with CAD. Physical examination of the far outer left breast reveals a firm mass at the approximate 3 o'clock position. Targeted ultrasound of the outer left breast was performed. There is an irregular hypoechoic mass at the 3 o'clock position 10 cm from nipple measuring 1.5 x 0.9 x 1.3 cm. This corresponds well with mammography findings. Three lymph nodes with borderline cortical thickening are visualized in the left axilla. IMPRESSION: 1.  Suspicious 1.5 cm palpable mass in the left breast. 2.  Mildly suspicious lymph nodes in the left axilla. RECOMMENDATION: 1. Recommend ultrasound-guided biopsy of the palpable mass in the outer left breast. 2. Recommend ultrasound-guided biopsy of the largest lymph node with borderline cortical thickening in the left axilla. I have discussed the findings and recommendations with the patient. If applicable, a reminder letter will be sent to the patient regarding the next appointment. BI-RADS CATEGORY  5: Highly suggestive of malignancy. Electronically Signed: By: Everlean Alstrom M.D. On: 01/18/2020 10:05   MS DIGITAL DIAG TOMO BILAT  Addendum Date: 01/19/2020   ADDENDUM REPORT: 01/19/2020 14:44 ADDENDUM: When the patient return for her left breast and left axillary node biopsies on today, 01/19/2020, she asked that I not  perform a biopsy of the left axillary lymph node. She was anxious about this procedure, and stated she had a bad experience with an axillary procedure in the past. Therefore, only the left breast biopsy was performed today. Electronically Signed   By: Curlene Dolphin M.D.   On: 01/19/2020 14:44   Result Date: 01/19/2020 CLINICAL DATA:  54 year old female with a palpable area of concern in the left breast. EXAM: DIGITAL DIAGNOSTIC BILATERAL MAMMOGRAM WITH CAD AND TOMO LEFT BREAST ULTRASOUND COMPARISON:  Previous exam(s). ACR Breast Density Category d: The breast tissue is extremely dense, which lowers the sensitivity of mammography. FINDINGS: No suspicious masses or calcifications are seen in the right breast. Spot compression tomograms were performed over the palpable area of concern in the far outer left breast demonstrating a mass with associated distortion partially  visualized. Mammographic images were processed with CAD. Physical examination of the far outer left breast reveals a firm mass at the approximate 3 o'clock position. Targeted ultrasound of the outer left breast was performed. There is an irregular hypoechoic mass at the 3 o'clock position 10 cm from nipple measuring 1.5 x 0.9 x 1.3 cm. This corresponds well with mammography findings. Three lymph nodes with borderline cortical thickening are visualized in the left axilla. IMPRESSION: 1.  Suspicious 1.5 cm palpable mass in the left breast. 2.  Mildly suspicious lymph nodes in the left axilla. RECOMMENDATION: 1. Recommend ultrasound-guided biopsy of the palpable mass in the outer left breast. 2. Recommend ultrasound-guided biopsy of the largest lymph node with borderline cortical thickening in the left axilla. I have discussed the findings and recommendations with the patient. If applicable, a reminder letter will be sent to the patient regarding the next appointment. BI-RADS CATEGORY  5: Highly suggestive of malignancy. Electronically Signed: By:  Everlean Alstrom M.D. On: 01/18/2020 10:05   [place] LT BREAST BX W LOC DEV 1ST LESION IMG BX SPEC US GUIDE  Addendum Date: 01/20/2020   ADDENDUM REPORT: 01/20/2020 12:47 ADDENDUM: PATHOLOGY revealed: A. BREAST, LEFT AT 3:00, 10 CM FROM THE NIPPLE; ULTRASOUND-GUIDED CORE NEEDLE BIOPSY: - INVASIVE MAMMARY CARCINOMA, NO SPECIAL TYPE. 10 mm in this sample. Grade 3. Ductal carcinoma in situ: Not identified. Lymphovascular invasion: Not identified. Pathology results are CONCORDANT with imaging findings, per Dr. Curlene Dolphin. Pathology results and recommendations below were discussed with patient by telephone on 01/20/2020. Patient reported biopsy site doing well with slight tenderness at the site. Post biopsy care instructions were reviewed and questions were answered. Patient was instructed to call [facility] if any concerns or questions arise related to the biopsy. Recommendation: Surgical referral. Request for surgical referral was relayed to nurse navigators at [facility] by Electa Sniff RN on 01/21/2020. Addendum by Electa Sniff RN on 01/20/2020. Electronically Signed   By: Curlene Dolphin M.D.   On: 01/20/2020 12:47   Result Date: 01/20/2020 CLINICAL DATA:  Ultrasound-guided core needle biopsy was recommended of a palpable left breast mass at 3 o'clock position 10 cm from the nipple. Ultrasound-guided core needle biopsy was also recommended of one of the patient's left axillary lymph nodes with borderline cortical thickening. Today, after the left breast biopsy, she declined the left axillary lymph node biopsy, as she had some stinging pain with the first pass of the left breast biopsy, and was nervous about proceeding with a left axillary lymph node biopsy. 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 She and I discussed the possibility that the mild cortical thickening of multiple  lymph nodes in the left axilla may be related to the COVID vaccine. EXAM: ULTRASOUND GUIDED LEFT BREAST CORE NEEDLE BIOPSY COMPARISON:  Previous exam(s). FINDINGS: I met with the patient and we discussed the procedure of ultrasound-guided biopsy, including benefits and alternatives. We discussed the high likelihood of a successful procedure. We discussed the risks of the procedure, including infection, bleeding, tissue injury, clip migration, and inadequate sampling. Informed written consent was given. The usual time-out protocol was performed immediately prior to the procedure. Lesion quadrant: Upper outer quadrant Using sterile technique and 1% Lidocaine as local anesthetic, under direct ultrasound visualization, a 14 gauge spring-loaded device was used to perform biopsy of a suspicious palpable mass far lateral left breast 3 o'clock  position 10 cm from the nipple using a lateral approach. At the conclusion of the procedure coil tissue marker clip was deployed into the biopsy cavity. The coil biopsy clip is well visualized under ultrasound. Given the patient's breast tenderness after the biopsy, the far lateral position of the mass making it difficult to visualize mammographically, and the easy visualization of the coil biopsy clip under ultrasound, a post clip mammogram was not performed. IMPRESSION: Ultrasound guided biopsy of the left breast. No apparent complications. Please note that the patient declined a left axillary lymph node biopsy today, as described above. Electronically Signed: By: Curlene Dolphin M.D. 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  PLAN:    1.  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  Because  patient had positive margins, she will return to surgery for reexcision and has an appointment with them this afternoon. Given the triple negative status of her disease, she will then receive adjuvant chemotherapy using Adriamycin and Cytoxan with Udenyca support followed by weekly Taxol x12.  Port has been placed.  If patient proceeds with full mastectomy, it is unclear whether she will require adjuvant XRT.  An aromatase inhibitor would not offer any benefit given the triple negative status of her disease.  Patient also will require a referral to genetics in the near future.  Return to clinic 1 week after her reexcision for further evaluation and treatment planning.  I spent a total of 30 minutes reviewing chart data, face-to-face evaluation with the patient, counseling and coordination of care as detailed above.    Patient expressed understanding and was in agreement with this plan. She also understands that She can call clinic at any time with any questions, concerns, or complaints.   Cancer Staging Carcinoma of upper-inner quadrant of left female breast [facility]) Staging form: Breast, AJCC 8th Edition - Clinical stage from 02/01/2020: Stage IB (cT1c, cN0, cM0, G3, ER-, PR-, HER2-) - Signed by Lloyd Huger, MD on 02/01/2020   Lloyd Huger, MD   02/16/2020 2:58 PM

## 2020-02-15 ENCOUNTER — Other Ambulatory Visit: Payer: Self-pay

## 2020-02-15 ENCOUNTER — Encounter: Payer: Self-pay | Admitting: Oncology

## 2020-02-16 ENCOUNTER — Ambulatory Visit: Payer: Medicaid Other | Admitting: Surgery

## 2020-02-16 ENCOUNTER — Other Ambulatory Visit: Payer: Self-pay

## 2020-02-16 ENCOUNTER — Inpatient Hospital Stay: Payer: Medicaid Other | Attending: Oncology | Admitting: Oncology

## 2020-02-16 ENCOUNTER — Encounter: Payer: Self-pay | Admitting: Surgery

## 2020-02-16 VITALS — BP 146/92 | HR 110 | Temp 96.6°F | Resp 18 | Wt 103.0 lb

## 2020-02-16 DIAGNOSIS — Z171 Estrogen receptor negative status [ER-]: Secondary | ICD-10-CM | POA: Insufficient documentation

## 2020-02-16 DIAGNOSIS — F17210 Nicotine dependence, cigarettes, uncomplicated: Secondary | ICD-10-CM | POA: Insufficient documentation

## 2020-02-16 DIAGNOSIS — C50212 Malignant neoplasm of upper-inner quadrant of left female breast: Secondary | ICD-10-CM | POA: Diagnosis present

## 2020-02-16 NOTE — Patient Instructions (Signed)
Lumpectomy, Care After This sheet gives you information about how to care for yourself after your procedure. Your health care provider may also give you more specific instructions. If you have problems or questions, contact your health care provider. What can I expect after the procedure? After the procedure, it is common to have:  Breast swelling.  Breast tenderness.  Stiffness in your arm or shoulder.  A change in the shape and feel of your breast.  Scar tissue that feels hard to the touch in the area where the lump was removed. Follow these instructions at home: Medicines  Take over-the-counter and prescription medicines only as told by your health care provider.  If you were prescribed an antibiotic medicine, take it as told by your health care provider. Do not stop taking the antibiotic even if you start to feel better.  Ask your health care provider if the medicine prescribed to you: ? Requires you to avoid driving or using heavy machinery. ? Can cause constipation. You may need to take these actions to prevent or treat constipation:  Drink enough fluid to keep your urine pale yellow.  Take over-the-counter or prescription medicines.  Eat foods that are high in fiber, such as beans, whole grains, and fresh fruits and vegetables.  Limit foods that are high in fat and processed sugars, such as fried or sweet foods. Incision care      Follow instructions from your health care provider about how to take care of your incision. Make sure you: ? Wash your hands with soap and water before and after you change your bandage (dressing). If soap and water are not available, use hand sanitizer. ? Change your dressing as told by your health care provider. ? Leave stitches (sutures), skin glue, or adhesive strips in place. These skin closures may need to stay in place for 2 weeks or longer. If adhesive strip edges start to loosen and curl up, you may trim the loose edges. Do not  remove adhesive strips completely unless your health care provider tells you to do that.  Check your incision area every day for signs of infection. Check for: ? More redness, swelling, or pain. ? Fluid or blood. ? Warmth. ? Pus or a bad smell.  Keep your dressing clean and dry.  If you were sent home with a surgical drain in place, follow instructions from your health care provider about emptying it. Bathing  Do not take baths, swim, or use a hot tub until your health care provider approves.  Ask your health care provider if you may take showers. You may only be allowed to take sponge baths. Activity  Rest as told by your health care provider.  Avoid sitting for a long time without moving. Get up to take short walks every 1-2 hours. This is important to improve blood flow and breathing. Ask for help if you feel weak or unsteady.  Return to your normal activities as told by your health care provider. Ask your health care provider what activities are safe for you.  Be careful to avoid any activities that could cause an injury to your arm on the side of your surgery.  Do not lift anything that is heavier than 10 lb (4.5 kg), or the limit that you are told, until your health care provider says that it is safe. Avoid lifting with the arm that is on the side of your surgery.  Do not carry heavy objects on your shoulder on the side  of your surgery.  Do exercises to keep your shoulder and arm from getting stiff and swollen. Talk with your health care provider about which exercises are safe for you. General instructions  Wear a supportive bra as told by your health care provider.  Raise (elevate) your arm above the level of your heart while you are sitting or lying down.  Do not wear tight jewelry on your arm, wrist, or fingers on the side of your surgery.  Keep all follow-up visits as told by your health care provider. This is important. ? You may need to be screened for extra fluid  around the lymph nodes and swelling in the breast and arm (lymphedema). Follow instructions from your health care provider about how often you should be checked.  If you had any lymph nodes removed during your procedure, be sure to tell all of your health care providers. This is important information to share before you are involved in certain procedures, such as having blood tests or having your blood pressure taken. Contact a health care provider if:  You develop a rash.  You have a fever.  Your pain medicine is not working.  You have swelling, weakness, or numbness in your arm that does not improve after a few weeks.  You have new swelling in your breast.  You have any of these signs of infection: ? More redness, swelling, or pain in your incision area. ? Fluid or blood coming from your incision. ? Warmth coming from the incision area. ? Pus or a bad smell coming from your incision. Get help right away if you have:  Very bad pain in your breast or arm.  Swelling in your legs or arms.  Redness, warmth, or pain in your leg or arm.  Chest pain.  Difficulty breathing. Summary  After the procedure, it is common to have breast tenderness, swelling in your breast, and stiffness in your arm and shoulder.  Follow instructions from your health care provider about how to take care of your incision.  Do not lift anything that is heavier than 10 lb (4.5 kg), or the limit that you are told, until your health care provider says that it is safe. Avoid lifting with the arm that is on the side of your surgery.  If you had any lymph nodes removed during your procedure, be sure to tell all of your health care providers. This is important information to share before you are involved in certain procedures, such as having blood tests or having your blood pressure taken. This information is not intended to replace advice given to you by your health care provider. Make sure you discuss any  questions you have with your health care provider. Document Revised: 05/31/2019 Document Reviewed: 05/31/2019 Elsevier Patient Education  [place].

## 2020-02-17 NOTE — Progress Notes (Signed)
Supported patient at post op visit with Dr. [name], where need for re-excision was discussed.  Patient was scheduled to see Dr. Dahlia Byes to discuss surgery options, but that appointment was moved to 02/21/20.  She will call back with surgery date, so follow-up with Dr. [name] can be scheduled.

## 2020-02-21 ENCOUNTER — Encounter: Payer: Medicaid Other | Admitting: Surgery

## 2020-02-21 ENCOUNTER — Telehealth: Payer: Self-pay | Admitting: Surgery

## 2020-02-21 ENCOUNTER — Other Ambulatory Visit: Payer: Self-pay

## 2020-02-21 ENCOUNTER — Encounter: Payer: Self-pay | Admitting: Surgery

## 2020-02-21 ENCOUNTER — Ambulatory Visit (INDEPENDENT_AMBULATORY_CARE_PROVIDER_SITE_OTHER): Payer: Self-pay | Admitting: Surgery

## 2020-02-21 VITALS — BP 117/77 | HR 72 | Temp 97.8°F | Resp 16 | Ht 65.5 in | Wt 105.0 lb

## 2020-02-21 DIAGNOSIS — C50212 Malignant neoplasm of upper-inner quadrant of left female breast: Secondary | ICD-10-CM

## 2020-02-21 NOTE — Patient Instructions (Addendum)
Our surgery scheduler will call to schedule your surgery. Please have the [place] surgery sheet available when speaking with her.

## 2020-02-21 NOTE — H&P (View-Only) (Signed)
Outpatient Surgical Follow Up  02/21/2020  Deanna Ramsey is an 54 y.o. female.   Chief Complaint  Patient presents with  . Routine Post Op    left breast lumpectomy    HPI: Deanna Ramsey is a 54 year old female status post lumpectomy.  Discussed with her the pathology in detail.  She does have positive margin deep portion more towards the lateral and inferior aspects as per pathology discussion. She has been doing well has no complaints this morning.  No fevers no chills.  Past Medical History:  Diagnosis Date  . Arthritis   . Cellulitis   . COPD (chronic obstructive pulmonary disease) ([place])   . Depression   . History of kidney stones   . History of tracheostomy 2000   stated that epiglottis swelled up for an unknown reason; had trach for about a week.   . Hypertension   . Pneumonia     Past Surgical History:  Procedure Laterality Date  . arm surgery  1984   fractured ulna  . BREAST LUMPECTOMY WITH SENTINEL LYMPH NODE BIOPSY Left 02/04/2020   Procedure: BREAST LUMPECTOMY WITH SENTINEL LYMPH NODE BX;  Surgeon: Jules Husbands, MD;  Location: [facility] ORS;  Service: General;  Laterality: Left;  . CLEFT LIP REPAIR    . FRACTURE SURGERY    . kidney stones    . lymphnode neck    . PORTACATH PLACEMENT N/A 02/04/2020   Procedure: INSERTION PORT-A-CATH;  Surgeon: Jules Husbands, MD;  Location: [facility] ORS;  Service: General;  Laterality: N/A;  . TYMPANOSTOMY TUBE PLACEMENT      Family History  Problem Relation Age of Onset  . COPD Other   . COPD Mother   . Hypertension Mother   . COPD Father   . Breast cancer Neg Hx     Social History:  reports that she has been smoking cigarettes. She has been smoking about 1.00 pack per day. She has never used smokeless tobacco. She reports that she does not drink alcohol or use drugs.  Allergies:  Allergies  Allergen Reactions  . Sulfa Antibiotics Nausea And Vomiting    Medications reviewed.    ROS Full ROS performed and is otherwise negative  other than what is stated in HPI   BP 117/77   Pulse 72   Temp 97.8 F (36.6 C) (Temporal)   Resp 16   Ht 5' 5.5" (1.664 m)   Wt 105 lb (47.6 kg)   LMP 02/28/2012   SpO2 98%   BMI 17.21 kg/m   Physical Exam Vitals and nursing note reviewed. Exam conducted with a chaperone present.  Constitutional:      General: She is not in acute distress.    Appearance: Normal appearance. She is normal weight.  Cardiovascular:     Rate and Rhythm: Normal rate and regular rhythm.  Pulmonary:     Effort: Pulmonary effort is normal. No respiratory distress.     Breath sounds: No stridor. No wheezing.     Comments: Breast: lumpectomy scar and axilla healing well, no seroma, cosmetically very pleasing w/o deformity or seromas Abdominal:     General: Abdomen is flat. There is no distension.     Palpations: There is no mass.     Tenderness: There is no abdominal tenderness. There is no guarding.     Hernia: No hernia is present.  Musculoskeletal:     Cervical back: Normal range of motion and neck supple. No rigidity or tenderness.  Skin:  Capillary Refill: Capillary refill takes less than 2 seconds.  Neurological:     General: No focal deficit present.     Mental Status: She is alert and oriented to person, place, and time.  Psychiatric:        Mood and Affect: Mood normal.        Behavior: Behavior normal.        Thought Content: Thought content normal.        Judgment: Judgment normal.        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 D/W the pt in detail about the need for re-excision despite the deeper margin being in the muscle.  We will make sure we take some more lateral and inferior borders as well as deep. I Discussed with her the high chance of the margins being negative. Procedure discussed with the patient in detail.  Risks, benefits and possible applications including but not limited to: Bleeding, infection  Caroleen Hamman, MD [place]  Surgeon

## 2020-02-21 NOTE — Telephone Encounter (Signed)
Pt has been advised of pre admission date/time, Covid Testing date and Surgery date.  Surgery Date: 02/24/20 Preadmission Testing Date: N/A Covid Testing Date: 02/22/20 - patient advised to go to the [facility]  Patient has been made aware to call [phone], between 1-3:00pm the day before surgery, to find out what time to arrive.

## 2020-02-21 NOTE — Progress Notes (Signed)
Outpatient Surgical Follow Up  02/21/2020  Deanna Ramsey is an 54 y.o. female.   Chief Complaint  Patient presents with  . Routine Post Op    left breast lumpectomy    HPI: Deanna Ramsey is a 54 year old female status post lumpectomy.  Discussed with her the pathology in detail.  She does have positive margin deep portion more towards the lateral and inferior aspects as per pathology discussion. She has been doing well has no complaints this morning.  No fevers no chills.  Past Medical History:  Diagnosis Date  . Arthritis   . Cellulitis   . COPD (chronic obstructive pulmonary disease) ([place])   . Depression   . History of kidney stones   . History of tracheostomy 2000   stated that epiglottis swelled up for an unknown reason; had trach for about a week.   . Hypertension   . Pneumonia     Past Surgical History:  Procedure Laterality Date  . arm surgery  1984   fractured ulna  . BREAST LUMPECTOMY WITH SENTINEL LYMPH NODE BIOPSY Left 02/04/2020   Procedure: BREAST LUMPECTOMY WITH SENTINEL LYMPH NODE BX;  Surgeon: Jules Husbands, MD;  Location: [facility] ORS;  Service: General;  Laterality: Left;  . CLEFT LIP REPAIR    . FRACTURE SURGERY    . kidney stones    . lymphnode neck    . PORTACATH PLACEMENT N/A 02/04/2020   Procedure: INSERTION PORT-A-CATH;  Surgeon: Jules Husbands, MD;  Location: [facility] ORS;  Service: General;  Laterality: N/A;  . TYMPANOSTOMY TUBE PLACEMENT      Family History  Problem Relation Age of Onset  . COPD Other   . COPD Mother   . Hypertension Mother   . COPD Father   . Breast cancer Neg Hx     Social History:  reports that she has been smoking cigarettes. She has been smoking about 1.00 pack per day. She has never used smokeless tobacco. She reports that she does not drink alcohol or use drugs.  Allergies:  Allergies  Allergen Reactions  . Sulfa Antibiotics Nausea And Vomiting    Medications reviewed.    ROS Full ROS performed and is otherwise negative  other than what is stated in HPI   BP 117/77   Pulse 72   Temp 97.8 F (36.6 C) (Temporal)   Resp 16   Ht 5' 5.5" (1.664 m)   Wt 105 lb (47.6 kg)   LMP 02/28/2012   SpO2 98%   BMI 17.21 kg/m   Physical Exam Vitals and nursing note reviewed. Exam conducted with a chaperone present.  Constitutional:      General: She is not in acute distress.    Appearance: Normal appearance. She is normal weight.  Cardiovascular:     Rate and Rhythm: Normal rate and regular rhythm.  Pulmonary:     Effort: Pulmonary effort is normal. No respiratory distress.     Breath sounds: No stridor. No wheezing.     Comments: Breast: lumpectomy scar and axilla healing well, no seroma, cosmetically very pleasing w/o deformity or seromas Abdominal:     General: Abdomen is flat. There is no distension.     Palpations: There is no mass.     Tenderness: There is no abdominal tenderness. There is no guarding.     Hernia: No hernia is present.  Musculoskeletal:     Cervical back: Normal range of motion and neck supple. No rigidity or tenderness.  Skin:  Capillary Refill: Capillary refill takes less than 2 seconds.  Neurological:     General: No focal deficit present.     Mental Status: She is alert and oriented to person, place, and time.  Psychiatric:        Mood and Affect: Mood normal.        Behavior: Behavior normal.        Thought Content: Thought content normal.        Judgment: Judgment normal.        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 D/W the pt in detail about the need for re-excision despite the deeper margin being in the muscle.  We will make sure we take some more lateral and inferior borders as well as deep. I Discussed with her the high chance of the margins being negative. Procedure discussed with the patient in detail.  Risks, benefits and possible applications including but not limited to: Bleeding, infection  Caroleen Hamman, MD [place]  Surgeon

## 2020-02-22 ENCOUNTER — Other Ambulatory Visit
Admission: RE | Admit: 2020-02-22 | Discharge: 2020-02-22 | Disposition: A | Discharge status: Home / Self Care | Payer: Medicaid Other | Source: Ambulatory Visit | Attending: Surgery | Admitting: Surgery

## 2020-02-22 DIAGNOSIS — Z01812 Encounter for preprocedural laboratory examination: Secondary | ICD-10-CM | POA: Diagnosis not present

## 2020-02-22 DIAGNOSIS — Z20822 Contact with and (suspected) exposure to covid-19: Secondary | ICD-10-CM | POA: Diagnosis not present

## 2020-02-22 LAB — SARS CORONAVIRUS 2 (TAT 6-24 HRS): SARS Coronavirus 2: NEGATIVE

## 2020-02-23 ENCOUNTER — Telehealth: Payer: Self-pay | Admitting: Emergency Medicine

## 2020-02-23 NOTE — Telephone Encounter (Signed)
-----   Message from Jules Husbands, MD sent at 02/23/2020  8:45 AM EDT ----- Please let the pt know they r covid negative ----- Message ----- From: Interface, Lab In [facility] Sent: 02/22/2020   8:14 PM EDT To: Jules Husbands, MD

## 2020-02-23 NOTE — Telephone Encounter (Signed)
Pt made aware of Negative COVID test.

## 2020-02-24 ENCOUNTER — Encounter: Payer: Self-pay | Admitting: Surgery

## 2020-02-24 ENCOUNTER — Other Ambulatory Visit: Payer: Self-pay

## 2020-02-24 ENCOUNTER — Ambulatory Visit: Payer: Medicaid Other | Admitting: Anesthesiology

## 2020-02-24 ENCOUNTER — Encounter
Admission: RE | Disposition: A | Discharge status: Home / Self Care | Payer: Self-pay | Source: Home / Self Care | Attending: Surgery

## 2020-02-24 ENCOUNTER — Ambulatory Visit
Admission: RE | Admit: 2020-02-24 | Discharge: 2020-02-24 | Disposition: A | Discharge status: Home / Self Care | Payer: Medicaid Other | Attending: Surgery | Admitting: Surgery

## 2020-02-24 DIAGNOSIS — I10 Essential (primary) hypertension: Secondary | ICD-10-CM | POA: Insufficient documentation

## 2020-02-24 DIAGNOSIS — C50212 Malignant neoplasm of upper-inner quadrant of left female breast: Secondary | ICD-10-CM

## 2020-02-24 DIAGNOSIS — F17210 Nicotine dependence, cigarettes, uncomplicated: Secondary | ICD-10-CM | POA: Diagnosis not present

## 2020-02-24 DIAGNOSIS — C50412 Malignant neoplasm of upper-outer quadrant of left female breast: Secondary | ICD-10-CM | POA: Insufficient documentation

## 2020-02-24 DIAGNOSIS — J449 Chronic obstructive pulmonary disease, unspecified: Secondary | ICD-10-CM | POA: Insufficient documentation

## 2020-02-24 HISTORY — PX: RE-EXCISION OF BREAST LUMPECTOMY: SHX6048

## 2020-02-24 HISTORY — PX: BREAST LUMPECTOMY: SHX2

## 2020-02-24 SURGERY — EXCISION, LESION, BREAST
Anesthesia: General | Laterality: Left

## 2020-02-24 MED ORDER — FAMOTIDINE 20 MG PO TABS
ORAL_TABLET | ORAL | Status: AC
  Filled 2020-02-24: qty 1

## 2020-02-24 MED ORDER — DEXAMETHASONE SODIUM PHOSPHATE 10 MG/ML IJ SOLN
INTRAMUSCULAR | Status: DC | PRN
  Administered 2020-02-24: 10 mg via INTRAVENOUS

## 2020-02-24 MED ORDER — LIDOCAINE HCL (PF) 2 % IJ SOLN
INTRAMUSCULAR | Status: AC
  Filled 2020-02-24: qty 10

## 2020-02-24 MED ORDER — PHENYLEPHRINE HCL (PRESSORS) 10 MG/ML IV SOLN
INTRAVENOUS | Status: DC | PRN
  Administered 2020-02-24 (×2): 100 ug via INTRAVENOUS
  Administered 2020-02-24: 200 ug via INTRAVENOUS
  Administered 2020-02-24: 100 ug via INTRAVENOUS
  Administered 2020-02-24: 200 ug via INTRAVENOUS

## 2020-02-24 MED ORDER — CEFAZOLIN SODIUM-DEXTROSE 2-4 GM/100ML-% IV SOLN
INTRAVENOUS | Status: AC
  Filled 2020-02-24: qty 100

## 2020-02-24 MED ORDER — GABAPENTIN 300 MG PO CAPS
300.0000 mg | ORAL_CAPSULE | ORAL | Status: AC
  Administered 2020-02-24: 300 mg via ORAL

## 2020-02-24 MED ORDER — ONDANSETRON HCL 4 MG/2ML IJ SOLN
INTRAMUSCULAR | Status: AC
  Filled 2020-02-24: qty 2

## 2020-02-24 MED ORDER — ONDANSETRON HCL 4 MG/2ML IJ SOLN
INTRAMUSCULAR | Status: DC | PRN
  Administered 2020-02-24: 4 mg via INTRAVENOUS

## 2020-02-24 MED ORDER — BUPIVACAINE-EPINEPHRINE (PF) 0.25% -1:200000 IJ SOLN
INTRAMUSCULAR | Status: DC | PRN
  Administered 2020-02-24: 30 mL via PERINEURAL

## 2020-02-24 MED ORDER — CEFAZOLIN SODIUM-DEXTROSE 2-4 GM/100ML-% IV SOLN
2.0000 g | INTRAVENOUS | Status: AC
  Administered 2020-02-24: 2 g via INTRAVENOUS

## 2020-02-24 MED ORDER — FENTANYL CITRATE (PF) 100 MCG/2ML IJ SOLN
INTRAMUSCULAR | Status: DC | PRN
  Administered 2020-02-24 (×2): 25 ug via INTRAVENOUS

## 2020-02-24 MED ORDER — LACTATED RINGERS IV SOLN: INTRAVENOUS | Status: DC

## 2020-02-24 MED ORDER — GLYCOPYRROLATE 0.2 MG/ML IJ SOLN
INTRAMUSCULAR | Status: DC | PRN
  Administered 2020-02-24: .1 mg via INTRAVENOUS

## 2020-02-24 MED ORDER — HYDROCODONE-ACETAMINOPHEN 5-325 MG PO TABS: 1.0000 | ORAL_TABLET | ORAL | 0 refills | Status: DC | PRN

## 2020-02-24 MED ORDER — OXYCODONE HCL 5 MG PO TABS: 5.0000 mg | ORAL_TABLET | Freq: Once | ORAL | Status: AC | PRN

## 2020-02-24 MED ORDER — ACETAMINOPHEN 10 MG/ML IV SOLN
INTRAVENOUS | Status: AC
  Filled 2020-02-24: qty 100

## 2020-02-24 MED ORDER — OXYCODONE HCL 5 MG/5ML PO SOLN: 5.0000 mg | Freq: Once | ORAL | Status: AC | PRN

## 2020-02-24 MED ORDER — LIDOCAINE HCL (CARDIAC) PF 100 MG/5ML IV SOSY
PREFILLED_SYRINGE | INTRAVENOUS | Status: DC | PRN
  Administered 2020-02-24: 60 mg via INTRAVENOUS

## 2020-02-24 MED ORDER — PROMETHAZINE HCL 25 MG/ML IJ SOLN: 6.2500 mg | INTRAMUSCULAR | Status: DC | PRN

## 2020-02-24 MED ORDER — FENTANYL CITRATE (PF) 100 MCG/2ML IJ SOLN
INTRAMUSCULAR | Status: AC
  Filled 2020-02-24: qty 2

## 2020-02-24 MED ORDER — PROPOFOL 10 MG/ML IV BOLUS
INTRAVENOUS | Status: DC | PRN
  Administered 2020-02-24: 50 mg via INTRAVENOUS
  Administered 2020-02-24: 130 mg via INTRAVENOUS

## 2020-02-24 MED ORDER — MIDAZOLAM HCL 2 MG/2ML IJ SOLN
INTRAMUSCULAR | Status: AC
  Filled 2020-02-24: qty 2

## 2020-02-24 MED ORDER — GLYCOPYRROLATE 0.2 MG/ML IJ SOLN
INTRAMUSCULAR | Status: AC
  Filled 2020-02-24: qty 1

## 2020-02-24 MED ORDER — MIDAZOLAM HCL 2 MG/2ML IJ SOLN
INTRAMUSCULAR | Status: DC | PRN
  Administered 2020-02-24 (×2): 1 mg via INTRAVENOUS

## 2020-02-24 MED ORDER — CHLORHEXIDINE GLUCONATE CLOTH 2 % EX PADS
6.0000 | MEDICATED_PAD | Freq: Once | CUTANEOUS | Status: AC
  Administered 2020-02-24: 6 via TOPICAL

## 2020-02-24 MED ORDER — ACETAMINOPHEN 500 MG PO TABS
ORAL_TABLET | ORAL | Status: AC
  Filled 2020-02-24: qty 2

## 2020-02-24 MED ORDER — FENTANYL CITRATE (PF) 100 MCG/2ML IJ SOLN: 25.0000 ug | INTRAMUSCULAR | Status: DC | PRN

## 2020-02-24 MED ORDER — EPHEDRINE SULFATE 50 MG/ML IJ SOLN
INTRAMUSCULAR | Status: DC | PRN
  Administered 2020-02-24: 10 mg via INTRAVENOUS
  Administered 2020-02-24: 15 mg via INTRAVENOUS
  Administered 2020-02-24 (×2): 10 mg via INTRAVENOUS

## 2020-02-24 MED ORDER — BUPIVACAINE-EPINEPHRINE (PF) 0.25% -1:200000 IJ SOLN
INTRAMUSCULAR | Status: AC
  Filled 2020-02-24: qty 30

## 2020-02-24 MED ORDER — DEXAMETHASONE SODIUM PHOSPHATE 10 MG/ML IJ SOLN
INTRAMUSCULAR | Status: AC
  Filled 2020-02-24: qty 1

## 2020-02-24 MED ORDER — OXYCODONE HCL 5 MG PO TABS
ORAL_TABLET | ORAL | Status: AC
  Administered 2020-02-24: 5 mg via ORAL
  Filled 2020-02-24: qty 1

## 2020-02-24 MED ORDER — FAMOTIDINE 20 MG PO TABS
20.0000 mg | ORAL_TABLET | Freq: Once | ORAL | Status: AC
  Administered 2020-02-24: 14:00:00 20 mg via ORAL

## 2020-02-24 MED ORDER — GABAPENTIN 300 MG PO CAPS
ORAL_CAPSULE | ORAL | Status: AC
  Filled 2020-02-24: qty 1

## 2020-02-24 MED ORDER — ACETAMINOPHEN 500 MG PO TABS
1000.0000 mg | ORAL_TABLET | ORAL | Status: AC
  Administered 2020-02-24: 1000 mg via ORAL

## 2020-02-24 SURGICAL SUPPLY — 41 items
APPLIER CLIP 9.375 SM OPEN (CLIP)
BLADE SURG 15 STRL LF DISP TIS (BLADE) ×1 IMPLANT
BLADE SURG 15 STRL SS (BLADE) ×2
CANISTER SUCT 1200ML W/VALVE (MISCELLANEOUS) ×3 IMPLANT
CHLORAPREP W/TINT 26 (MISCELLANEOUS) ×3 IMPLANT
CLIP APPLIE 9.375 SM OPEN (CLIP) IMPLANT
CNTNR SPEC 2.5X3XGRAD LEK (MISCELLANEOUS) ×1
CONT SPEC 4OZ STER OR WHT (MISCELLANEOUS) ×2
CONTAINER SPEC 2.5X3XGRAD LEK (MISCELLANEOUS) ×1 IMPLANT
COVER PROBE FLX POLY STRL (MISCELLANEOUS) ×3 IMPLANT
COVER WAND RF STERILE (DRAPES) ×3 IMPLANT
DERMABOND ADVANCED (GAUZE/BANDAGES/DRESSINGS) ×2
DERMABOND ADVANCED .7 DNX12 (GAUZE/BANDAGES/DRESSINGS) ×1 IMPLANT
DRAPE INCISE IOBAN 66X45 STRL (DRAPES) ×3 IMPLANT
DRAPE LAPAROTOMY TRNSV 106X77 (MISCELLANEOUS) ×3 IMPLANT
ELECT REM PT RETURN 9FT ADLT (ELECTROSURGICAL) ×3
ELECTRODE REM PT RTRN 9FT ADLT (ELECTROSURGICAL) ×1 IMPLANT
GLOVE BIO SURGEON STRL SZ7 (GLOVE) ×3 IMPLANT
GLOVE INDICATOR 7.5 STRL GRN (GLOVE) ×3 IMPLANT
GOWN STRL REUS W/ TWL LRG LVL3 (GOWN DISPOSABLE) ×2 IMPLANT
GOWN STRL REUS W/TWL LRG LVL3 (GOWN DISPOSABLE) ×4
KIT MARKER MARGIN INK (KITS) ×3 IMPLANT
KIT TURNOVER KIT A (KITS) ×3 IMPLANT
LABEL OR SOLS (LABEL) ×3 IMPLANT
MARGIN MAP 10MM (MISCELLANEOUS) IMPLANT
MARKER MARGIN CORRECT CLIP (MARKER) IMPLANT
NEEDLE HYPO 22GX1.5 SAFETY (NEEDLE) ×3 IMPLANT
PACK BASIN MINOR ARMC (MISCELLANEOUS) ×3 IMPLANT
SLEVE PROBE SENORX GAMMA FIND (MISCELLANEOUS) IMPLANT
SPONGE LAP 18X18 RF (DISPOSABLE) ×3 IMPLANT
SUT ETHILON 3-0 FS-10 30 BLK (SUTURE) ×3
SUT MNCRL 4-0 (SUTURE) ×2
SUT MNCRL 4-0 27XMFL (SUTURE) ×1
SUT SILK 2 0 SH (SUTURE) ×3 IMPLANT
SUT VIC AB 2-0 CT1 (SUTURE) ×3 IMPLANT
SUT VIC AB 3-0 SH 27 (SUTURE) ×4
SUT VIC AB 3-0 SH 27X BRD (SUTURE) ×2 IMPLANT
SUTURE EHLN 3-0 FS-10 30 BLK (SUTURE) ×1 IMPLANT
SUTURE MNCRL 4-0 27XMF (SUTURE) ×1 IMPLANT
SYR 20ML LL LF (SYRINGE) ×3 IMPLANT
WATER STERILE IRR 1000ML POUR (IV SOLUTION) ×3 IMPLANT

## 2020-02-24 NOTE — Interval H&P Note (Signed)
History and Physical Interval Note:  02/24/2020 2:41 PM  Deanna Ramsey  has presented today for surgery, with the diagnosis of Breast Cancer.  The various methods of treatment have been discussed with the patient and family. After consideration of risks, benefits and other options for treatment, the patient has consented to  Procedure(s): RE-EXCISION OF Left BREAST LUMPECTOMY (Left) as a surgical intervention.  The patient's history has been reviewed, patient examined, no change in status, stable for surgery.  I have reviewed the patient's chart and labs.  Questions were answered to the patient's satisfaction.     [place]

## 2020-02-24 NOTE — Transfer of Care (Addendum)
Immediate Anesthesia Transfer of Care Note  [name]  Procedure(s) Performed: RE-EXCISION OF Left BREAST LUMPECTOMY (Left )  Patient Location: PACU  Anesthesia Type:General  Level of Consciousness: sedated  Airway & Oxygen Therapy: Patient connected to face mask oxygen  Post-op Assessment: Post -op Vital signs reviewed and stable  Post vital signs: stable  Last Vitals:  Vitals Value Taken Time  BP 101/67 02/24/20 1615  Temp 36.5 C 02/24/20 1546  Pulse 74 02/24/20 1616  Resp 25 02/24/20 1610  SpO2 98 % 02/24/20 1616  Vitals shown include unvalidated device data.  Last Pain:  Vitals:   02/24/20 1546  TempSrc:   PainSc: Asleep         Complications: No apparent anesthesia complications

## 2020-02-24 NOTE — Discharge Instructions (Signed)

## 2020-02-24 NOTE — Anesthesia Preprocedure Evaluation (Signed)
Anesthesia Evaluation  Patient identified by MRN, date of birth, ID band Patient awake    Reviewed: Allergy & Precautions, H&P , NPO status , Patient's Chart, lab work & pertinent test results  Airway Mallampati: II  TM Distance: >3 FB Neck ROM: full    Dental  (+) Chipped   Pulmonary neg sleep apnea, COPD,  COPD inhaler, Current Smoker and Patient abstained from smoking.,    breath sounds clear to auscultation       Cardiovascular hypertension, On Medications (-) angina(-) Past MI and (-) Cardiac Stents (-) dysrhythmias  Rhythm:regular Rate:Normal     Neuro/Psych Depression negative neurological ROS  negative psych ROS   GI/Hepatic negative GI ROS, Neg liver ROS,   Endo/Other  negative endocrine ROS  Renal/GU Kidney stones     Musculoskeletal   Abdominal   Peds  Hematology negative hematology ROS (+)   Anesthesia Other Findings Breast cancer  Past Medical History: No date: Arthritis No date: Cellulitis No date: COPD (chronic obstructive pulmonary disease) ([facility]) No date: Depression No date: History of kidney stones 2000: History of tracheostomy     Comment:  stated that epiglottis swelled up for an unknown reason;              had trach for about a week.  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 No date: FRACTURE SURGERY No date: kidney stones No date: lymphnode neck No date: TYMPANOSTOMY TUBE PLACEMENT     Reproductive/Obstetrics negative OB ROS                             Anesthesia Physical  Anesthesia Plan  ASA: II  Anesthesia Plan: General LMA   Post-op Pain Management:    Induction:   PONV Risk Score and Plan: Ondansetron, Dexamethasone, Midazolam and Treatment may vary due to age or medical condition  Airway Management Planned:   Additional Equipment:    Intra-op Plan:   Post-operative Plan:   Informed Consent: I have reviewed the patients History and Physical, chart, labs and discussed the procedure including the risks, benefits and alternatives for the proposed anesthesia with the patient or authorized representative who has indicated his/her understanding and acceptance.     Dental Advisory Given  Plan Discussed with: Anesthesiologist  Anesthesia Plan Comments:        Anesthesia Quick Evaluation

## 2020-02-24 NOTE — Op Note (Signed)
  Pre-operative Diagnosis: left Triple negative Breast Cancer with positive deep margin  Post-operative Diagnosis: Same   Surgeon: Judeth Horn, MD FACS  Anesthesia: GETA  Procedure: Re-excision Partial mastectomy with attention to margins  Findings: specimen marked with paint for margins  Estimated Blood Loss: Minimal         Drains: None         Specimens: partial mastectomy       Complications: none                Condition: Stable    Procedure Details  The patient was seen again in the Holding Room. The benefits, complications, treatment options, and expected outcomes were discussed with the patient. The risks of bleeding, infection, recurrence of symptoms, failure to resolve symptoms, hematoma, seroma, open wound, cosmetic deformity, and the need for further surgery were discussed.  The patient was taken to Operating Room, identified as Deanna Ramsey and the procedure verified.  A Time Out was held and the above information confirmed.  Prior to the induction of general anesthesia, antibiotic prophylaxis was administered. VTE prophylaxis was in place. Appropriate anesthesia was then administered and tolerated well. The chest was prepped with Chloraprep and draped in the sterile fashion. The patient was positioned in the supine position.    I turned my attention to the left breast where it elliptical incision was created and incised using the 15 blade knife.  Please note that I specifically took more  Re-excision margin on the posterior inferior and lateral margins where the final pathology  Was positive. Using cautery good margins were obtained and deep margin was passed the pectoralis, This time I took a shaving of the muscle to make sure we could correlate it appropriately with microscopic pathology.  I painted the specimen and sent it for permanent path after discussion with pathology as they felt there is no role for frozen section nor margins in this specific case. Wound closed  in two layers w 2-0 vicryl and 4-0 monocryl for the skin. Dermabond was used to coat the skin.  Marcaine injected for post op analgesia. Needle and laparotomy counts were correct.    Judeth Horn MD, FACS

## 2020-02-24 NOTE — Anesthesia Procedure Notes (Addendum)
Procedure Name: LMA Insertion Date/Time: 02/24/2020 2:51 PM Performed by: Aline Brochure, CRNA Pre-anesthesia Checklist: Patient identified, Emergency Drugs available, Suction available and Patient being monitored Patient Re-evaluated:Patient Re-evaluated prior to induction Oxygen Delivery Method: Circle system utilized Preoxygenation: Pre-oxygenation with 100% oxygen Induction Type: IV induction Ventilation: Mask ventilation without difficulty LMA: LMA inserted LMA Size: 3.5 Placement Confirmation: positive ETCO2 and breath sounds checked- equal and bilateral Tube secured with: Tape Dental Injury: Teeth and Oropharynx as per pre-operative assessment

## 2020-02-28 LAB — SURGICAL PATHOLOGY

## 2020-02-28 NOTE — Anesthesia Postprocedure Evaluation (Signed)
Anesthesia Post Note  [name]  Procedure(s) Performed: RE-EXCISION OF Left BREAST LUMPECTOMY (Left )  Patient location during evaluation: PACU Anesthesia Type: General Level of consciousness: awake and alert Pain management: pain level controlled Vital Signs Assessment: post-procedure vital signs reviewed and stable Respiratory status: spontaneous breathing, nonlabored ventilation and respiratory function stable Cardiovascular status: blood pressure returned to baseline and stable Postop Assessment: no apparent nausea or vomiting Anesthetic complications: no     Last Vitals:  Vitals:   02/24/20 1631 02/24/20 1640  BP: 113/67 122/69  Pulse: 81 90  Resp: 12 12  Temp:  (!) 36.3 C  SpO2: 100% 98%    Last Pain:  Vitals:   02/24/20 1640  TempSrc: Temporal  PainSc: [place]

## 2020-03-04 NOTE — Progress Notes (Signed)
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  INTERVAL HISTORY: Patient returns to clinic today for further evaluation, discussion of her final pathology results, and treatment planning.  She currently feels well and is asymptomatic.  She has no neurologic complaints.  She denies any recent fevers or illnesses.  She has a good appetite and denies weight loss.  She has no chest pain, shortness of breath, cough, or hemoptysis.  She denies any nausea, vomiting, constipation, or diarrhea.  She has no urinary complaints.  Patient offers no specific complaints today.  REVIEW OF SYSTEMS:   Review of Systems  Constitutional: Negative.  Negative for fever, malaise/fatigue and weight loss.  Respiratory: Negative.  Negative for cough, hemoptysis and shortness of breath.   Cardiovascular: Negative.  Negative for chest pain and leg swelling.  Gastrointestinal: Negative.  Negative for abdominal pain.  Genitourinary: Negative.  Negative for dysuria.  Musculoskeletal: Negative.  Negative for back pain.  Skin: Negative.  Negative for rash.  Neurological: Negative.  Negative for dizziness, focal weakness, weakness and headaches.  Psychiatric/Behavioral: Negative.  The patient is not nervous/anxious.     As per HPI. Otherwise, a complete review of systems is negative.  PAST MEDICAL HISTORY: Past Medical History:  Diagnosis Date  . Arthritis   . Cellulitis   . COPD (chronic obstructive pulmonary disease) ([place])   . Depression   . History of kidney stones   . History of tracheostomy 2000   stated that epiglottis swelled up  for an unknown reason; had trach for about a week.   . Hypertension   . Pneumonia     PAST SURGICAL HISTORY: Past Surgical History:  Procedure Laterality Date  . arm surgery  1984   fractured ulna  . BREAST LUMPECTOMY WITH SENTINEL LYMPH NODE BIOPSY Left 02/04/2020   Procedure: BREAST LUMPECTOMY WITH SENTINEL LYMPH NODE BX;  Surgeon: Jules Husbands, MD;  Location: [facility] ORS;  Service: General;  Laterality: Left;  . CLEFT LIP REPAIR    . FRACTURE SURGERY    . kidney stones    . lymphnode neck    . PORTACATH PLACEMENT N/A 02/04/2020   Procedure: INSERTION PORT-A-CATH;  Surgeon: Jules Husbands, MD;  Location: [facility] ORS;  Service: General;  Laterality: N/A;  . RE-EXCISION OF BREAST LUMPECTOMY Left 02/24/2020   Procedure: RE-EXCISION OF Left BREAST LUMPECTOMY;  Surgeon: Jules Husbands, MD;  Location: [facility] ORS;  Service: General;  Laterality: Left;  . TYMPANOSTOMY TUBE PLACEMENT      FAMILY HISTORY: Family History  Problem Relation Age of Onset  . COPD Other   . COPD Mother   . Hypertension Mother   . COPD Father   . Breast cancer Neg Hx     ADVANCED DIRECTIVES (Y/N):  N  HEALTH MAINTENANCE: Social History   Tobacco Use  . Smoking status: Current Every Day Smoker    Packs/day: 1.00    Types: Cigarettes  . Smokeless tobacco: Never Used  Substance Use Topics  . Alcohol use: No  . Drug use: No     Colonoscopy:  PAP:  Bone density:  Lipid panel:  Allergies  Allergen Reactions  .  Sulfa Antibiotics Nausea And Vomiting    Current Outpatient Medications  Medication Sig Dispense Refill  . acetaminophen (TYLENOL) 325 MG tablet Take 650 mg by mouth every 6 (six) hours as needed for moderate pain.    Marland Kitchen albuterol (PROVENTIL HFA;VENTOLIN HFA) 108 (90 Base) MCG/ACT inhaler Inhale 2 puffs into the lungs every 6 (six) hours as needed for wheezing or shortness of breath. 1 Inhaler 2  . cetirizine (ZYRTEC) 10 MG tablet Take 10 mg by mouth daily.    . fluticasone (FLOVENT HFA) 110  MCG/ACT inhaler Inhale 2 puffs into the lungs daily.     Marland Kitchen lisinopril-hydrochlorothiazide (ZESTORETIC) 20-12.5 MG tablet Take 1 tablet by mouth daily.    Marland Kitchen HYDROcodone-acetaminophen (NORCO/VICODIN) 5-325 MG tablet Take 1-2 tablets by mouth every 4 (four) hours as needed for moderate pain. (Patient not taking: Reported on 03/06/2020) 15 tablet 0  . lidocaine-prilocaine (EMLA) cream Apply to affected area once 30 g 3  . ondansetron (ZOFRAN) 8 MG tablet Take 1 tablet (8 mg total) by mouth 2 (two) times daily as needed. 60 tablet 2  . prochlorperazine (COMPAZINE) 10 MG tablet Take 1 tablet (10 mg total) by mouth every 6 (six) hours as needed (Nausea or vomiting). 60 tablet 2   No current facility-administered medications for this visit.    OBJECTIVE: Vitals:   03/06/20 1418  BP: 96/62  Pulse: 73  Temp: (!) 95.8 F (35.4 C)  SpO2: 99%     Body mass index is 16.9 kg/m.    ECOG FS:0 - Asymptomatic  General: Well-developed, well-nourished, no acute distress. Eyes: Pink conjunctiva, anicteric sclera. HEENT: Normocephalic, moist mucous membranes. Breast: Well-healing surgical scar left breast. Lungs: No audible wheezing or coughing. Heart: Regular rate and rhythm. Abdomen: Soft, nontender, no obvious distention. Musculoskeletal: No edema, cyanosis, or clubbing. Neuro: Alert, answering all questions appropriately. Cranial nerves grossly intact. Skin: No rashes or petechiae noted. Psych: Normal affect.  LAB RESULTS:  Lab Results  Component Value Date   NA 138 02/02/2020   K 4.6 02/02/2020   CL 104 02/02/2020   CO2 23 02/02/2020   GLUCOSE 134 (H) 02/02/2020   BUN 36 (H) 02/02/2020   CREATININE 1.28 (H) 02/02/2020   CALCIUM 9.5 02/02/2020   PROT 8.2 (H) 02/02/2020   ALBUMIN 4.7 02/02/2020   AST 19 02/02/2020   ALT 11 02/02/2020   ALKPHOS 85 02/02/2020   BILITOT 0.6 02/02/2020   GFRNONAA 48 (L) 02/02/2020   GFRAA 55 (L) 02/02/2020    Lab Results  Component Value Date   WBC  7.5 02/02/2020   NEUTROABS 4.5 02/02/2020   HGB 14.6 02/02/2020   HCT 44.7 02/02/2020   MCV 97.0 02/02/2020   PLT 386 02/02/2020     STUDIES: No results found.  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  PLAN:    1.  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  Because of positive margins she underwent reexcision on February 24, 2020 with clear margins.  She has now had a port placed as well.  Patient will require MUGA scan prior to initiating treatment.  Given the triple negative status of her disease, she will then receive adjuvant chemotherapy using Adriamycin and Cytoxan with Udenyca support followed by weekly Taxol x12.  Patient will also require adjuvant XRT at the conclusion of her chemotherapy.  An aromatase inhibitor  would not offer any benefit given the triple negative status of her disease.  Genetics referral has been placed.  Return to clinic on March 23, 2020 to initiate cycle 1 of 4 of Adriamycin and Cytoxan.  I spent a total of 30 minutes reviewing chart data, face-to-face evaluation with the patient, counseling and coordination of care as detailed above.   Patient expressed understanding and was in agreement with this plan. She also understands that She can call clinic at any time with any questions, concerns, or complaints.   Cancer Staging Carcinoma of upper-inner quadrant of left female breast [facility]) Staging form: Breast, AJCC 8th Edition - Clinical stage from 02/01/2020: Stage IB (cT1c, cN0, cM0, G3, ER-, PR-, HER2-) - Signed by Lloyd Huger, MD on 02/01/2020   Lloyd Huger, MD   03/06/2020 4:10 PM

## 2020-03-06 ENCOUNTER — Inpatient Hospital Stay (HOSPITAL_BASED_OUTPATIENT_CLINIC_OR_DEPARTMENT_OTHER): Payer: Medicaid Other | Admitting: Oncology

## 2020-03-06 ENCOUNTER — Other Ambulatory Visit: Payer: Self-pay | Admitting: Emergency Medicine

## 2020-03-06 ENCOUNTER — Other Ambulatory Visit: Payer: Self-pay

## 2020-03-06 ENCOUNTER — Encounter: Payer: Self-pay | Admitting: Oncology

## 2020-03-06 VITALS — BP 96/62 | HR 73 | Temp 95.8°F | Wt 103.1 lb

## 2020-03-06 DIAGNOSIS — Z7189 Other specified counseling: Secondary | ICD-10-CM | POA: Insufficient documentation

## 2020-03-06 DIAGNOSIS — C50212 Malignant neoplasm of upper-inner quadrant of left female breast: Secondary | ICD-10-CM

## 2020-03-06 DIAGNOSIS — Z171 Estrogen receptor negative status [ER-]: Secondary | ICD-10-CM | POA: Diagnosis not present

## 2020-03-06 MED ORDER — LIDOCAINE-PRILOCAINE 2.5-2.5 % EX CREA: TOPICAL_CREAM | CUTANEOUS | 3 refills | Status: DC

## 2020-03-06 MED ORDER — ONDANSETRON HCL 8 MG PO TABS: 8.0000 mg | ORAL_TABLET | Freq: Two times a day (BID) | ORAL | 2 refills | Status: DC | PRN

## 2020-03-06 MED ORDER — PROCHLORPERAZINE MALEATE 10 MG PO TABS: 10.0000 mg | ORAL_TABLET | Freq: Four times a day (QID) | ORAL | 2 refills | Status: DC | PRN

## 2020-03-06 NOTE — Progress Notes (Signed)
START ON PATHWAY REGIMEN - Breast     Cycles 1 through 4: A cycle is every 14 days:     Doxorubicin      Cyclophosphamide      Pegfilgrastim-xxxx    Cycles 5 through 16: A cycle is every 7 days:     Paclitaxel   **Always confirm dose/schedule in your pharmacy ordering system**  Patient Characteristics: Postoperative without Neoadjuvant Therapy (Pathologic Staging), Invasive Disease, Adjuvant Therapy, HER2 Negative/Unknown/Equivocal, ER Negative/Unknown, Node Negative, pT1a-c, N1mi or pT1c or Higher, pN0 Therapeutic Status: Postoperative without Neoadjuvant Therapy (Pathologic Staging) AJCC Grade: G3 AJCC N Category: pN0 AJCC M Category: cM0 ER Status: Negative (-) AJCC 8 Stage Grouping: IB HER2 Status: Negative (-) Oncotype Dx Recurrence Score: Not Appropriate AJCC T Category: pT1c PR Status: Negative (-) Intent of Therapy: Curative Intent, Discussed with Patient 

## 2020-03-08 ENCOUNTER — Ambulatory Visit (INDEPENDENT_AMBULATORY_CARE_PROVIDER_SITE_OTHER): Payer: Self-pay | Admitting: Surgery

## 2020-03-08 ENCOUNTER — Other Ambulatory Visit: Payer: Self-pay

## 2020-03-08 ENCOUNTER — Encounter: Payer: Self-pay | Admitting: Surgery

## 2020-03-08 VITALS — BP 100/60 | HR 72 | Temp 97.9°F | Resp 12 | Ht 65.5 in | Wt 101.6 lb

## 2020-03-08 DIAGNOSIS — Z09 Encounter for follow-up examination after completed treatment for conditions other than malignant neoplasm: Secondary | ICD-10-CM

## 2020-03-08 NOTE — Patient Instructions (Signed)
You will hear from our office around August 2021 to schedule your office visit in September 2021.  Please call the office if you have any questions or concerns.  Lumpectomy, Care After This sheet gives you information about how to care for yourself after your procedure. Your health care provider may also give you more specific instructions. If you have problems or questions, contact your health care provider. What can I expect after the procedure? After the procedure, it is common to have:  Breast swelling.  Breast tenderness.  Stiffness in your arm or shoulder.  A change in the shape and feel of your breast.  Scar tissue that feels hard to the touch in the area where the lump was removed. Follow these instructions at home: Medicines  Take over-the-counter and prescription medicines only as told by your health care provider.  If you were prescribed an antibiotic medicine, take it as told by your health care provider. Do not stop taking the antibiotic even if you start to feel better.  Ask your health care provider if the medicine prescribed to you: ? Requires you to avoid driving or using heavy machinery. ? Can cause constipation. You may need to take these actions to prevent or treat constipation:  Drink enough fluid to keep your urine pale yellow.  Take over-the-counter or prescription medicines.  Eat foods that are high in fiber, such as beans, whole grains, and fresh fruits and vegetables.  Limit foods that are high in fat and processed sugars, such as fried or sweet foods. Incision care      Follow instructions from your health care provider about how to take care of your incision. Make sure you: ? Wash your hands with soap and water before and after you change your bandage (dressing). If soap and water are not available, use hand sanitizer. ? Change your dressing as told by your health care provider. ? Leave stitches (sutures), skin glue, or adhesive strips in place.  These skin closures may need to stay in place for 2 weeks or longer. If adhesive strip edges start to loosen and curl up, you may trim the loose edges. Do not remove adhesive strips completely unless your health care provider tells you to do that.  Check your incision area every day for signs of infection. Check for: ? More redness, swelling, or pain. ? Fluid or blood. ? Warmth. ? Pus or a bad smell.  Keep your dressing clean and dry.  If you were sent home with a surgical drain in place, follow instructions from your health care provider about emptying it. Bathing  Do not take baths, swim, or use a hot tub until your health care provider approves.  Ask your health care provider if you may take showers. You may only be allowed to take sponge baths. Activity  Rest as told by your health care provider.  Avoid sitting for a long time without moving. Get up to take short walks every 1-2 hours. This is important to improve blood flow and breathing. Ask for help if you feel weak or unsteady.  Return to your normal activities as told by your health care provider. Ask your health care provider what activities are safe for you.  Be careful to avoid any activities that could cause an injury to your arm on the side of your surgery.  Do not lift anything that is heavier than 10 lb (4.5 kg), or the limit that you are told, until your health care provider says that  it is safe. Avoid lifting with the arm that is on the side of your surgery.  Do not carry heavy objects on your shoulder on the side of your surgery.  Do exercises to keep your shoulder and arm from getting stiff and swollen. Talk with your health care provider about which exercises are safe for you. General instructions  Wear a supportive bra as told by your health care provider.  Raise (elevate) your arm above the level of your heart while you are sitting or lying down.  Do not wear tight jewelry on your arm, wrist, or fingers on  the side of your surgery.  Keep all follow-up visits as told by your health care provider. This is important. ? You may need to be screened for extra fluid around the lymph nodes and swelling in the breast and arm (lymphedema). Follow instructions from your health care provider about how often you should be checked.  If you had any lymph nodes removed during your procedure, be sure to tell all of your health care providers. This is important information to share before you are involved in certain procedures, such as having blood tests or having your blood pressure taken. Contact a health care provider if:  You develop a rash.  You have a fever.  Your pain medicine is not working.  You have swelling, weakness, or numbness in your arm that does not improve after a few weeks.  You have new swelling in your breast.  You have any of these signs of infection: ? More redness, swelling, or pain in your incision area. ? Fluid or blood coming from your incision. ? Warmth coming from the incision area. ? Pus or a bad smell coming from your incision. Get help right away if you have:  Very bad pain in your breast or arm.  Swelling in your legs or arms.  Redness, warmth, or pain in your leg or arm.  Chest pain.  Difficulty breathing. Summary  After the procedure, it is common to have breast tenderness, swelling in your breast, and stiffness in your arm and shoulder.  Follow instructions from your health care provider about how to take care of your incision.  Do not lift anything that is heavier than 10 lb (4.5 kg), or the limit that you are told, until your health care provider says that it is safe. Avoid lifting with the arm that is on the side of your surgery.  If you had any lymph nodes removed during your procedure, be sure to tell all of your health care providers. This is important information to share before you are involved in certain procedures, such as having blood tests or  having your blood pressure taken. This information is not intended to replace advice given to you by your health care provider. Make sure you discuss any questions you have with your health care provider. Document Revised: 05/31/2019 Document Reviewed: 05/31/2019 [employer] Patient Education  [employer].

## 2020-03-13 NOTE — Progress Notes (Signed)
That is post reexcision after positive margin for breast cancer.  Specimen review there is no evidence of positive margin on reexcision.  She is doing very well and is about to start chemo.  No fevers no chills  PE NAD Wound healing well, no significant breast deformity  A/P Doing very well Chemo and XRT No complications

## 2020-03-15 NOTE — Patient Instructions (Signed)
Doxorubicin injection What is this medicine? DOXORUBICIN (dox oh ROO bi sin) is a chemotherapy drug. It is used to treat many kinds of cancer like leukemia, lymphoma, neuroblastoma, sarcoma, and Wilms' tumor. It is also used to treat bladder cancer, breast cancer, lung cancer, ovarian cancer, stomach cancer, and thyroid cancer. This medicine may be used for other purposes; ask your health care provider or pharmacist if you have questions. COMMON BRAND NAME(S): Adriamycin, Adriamycin PFS, Adriamycin RDF, Rubex What should I tell my health care provider before I take this medicine? They need to know if you have any of these conditions:  heart disease  history of low blood counts caused by a medicine  liver disease  recent or ongoing radiation therapy  an unusual or allergic reaction to doxorubicin, other chemotherapy agents, other medicines, foods, dyes, or preservatives  pregnant or trying to get pregnant  breast-feeding How should I use this medicine? This drug is given as an infusion into a vein. It is administered in a hospital or clinic by a specially trained health care professional. If you have pain, swelling, burning or any unusual feeling around the site of your injection, tell your health care professional right away. Talk to your pediatrician regarding the use of this medicine in children. Special care may be needed. Overdosage: If you think you have taken too much of this medicine contact a poison control center or emergency room at once. NOTE: This medicine is only for you. Do not share this medicine with others. What if I miss a dose? It is important not to miss your dose. Call your doctor or health care professional if you are unable to keep an appointment. What may interact with this medicine? This medicine may interact with the following medications:  6-mercaptopurine  paclitaxel  phenytoin  St. John's Wort  trastuzumab  verapamil This list may not describe  all possible interactions. Give your health care provider a list of all the medicines, herbs, non-prescription drugs, or dietary supplements you use. Also tell them if you smoke, drink alcohol, or use illegal drugs. Some items may interact with your medicine. What should I watch for while using this medicine? This drug may make you feel generally unwell. This is not uncommon, as chemotherapy can affect healthy cells as well as cancer cells. Report any side effects. Continue your course of treatment even though you feel ill unless your doctor tells you to stop. There is a maximum amount of this medicine you should receive throughout your life. The amount depends on the medical condition being treated and your overall health. Your doctor will watch how much of this medicine you receive in your lifetime. Tell your doctor if you have taken this medicine before. You may need blood work done while you are taking this medicine. Your urine may turn red for a few days after your dose. This is not blood. If your urine is dark or brown, call your doctor. In some cases, you may be given additional medicines to help with side effects. Follow all directions for their use. Call your doctor or health care professional for advice if you get a fever, chills or sore throat, or other symptoms of a cold or flu. Do not treat yourself. This drug decreases your body's ability to fight infections. Try to avoid being around people who are sick. This medicine may increase your risk to bruise or bleed. Call your doctor or health care professional if you notice any unusual bleeding. Talk to your doctor   about your risk of cancer. You may be more at risk for certain types of cancers if you take this medicine. Do not become pregnant while taking this medicine or for 6 months after stopping it. Women should inform their doctor if they wish to become pregnant or think they might be pregnant. Men should not father a child while taking this  medicine and for 6 months after stopping it. There is a potential for serious side effects to an unborn child. Talk to your health care professional or pharmacist for more information. Do not breast-feed an infant while taking this medicine. This medicine has caused ovarian failure in some women and reduced sperm counts in some men This medicine may interfere with the ability to have a child. Talk with your doctor or health care professional if you are concerned about your fertility. This medicine may cause a decrease in Co-Enzyme Q-10. You should make sure that you get enough Co-Enzyme Q-10 while you are taking this medicine. Discuss the foods you eat and the vitamins you take with your health care professional. What side effects may I notice from receiving this medicine? Side effects that you should report to your doctor or health care professional as soon as possible:  allergic reactions like skin rash, itching or hives, swelling of the face, lips, or tongue  breathing problems  chest pain  fast or irregular heartbeat  low blood counts - this medicine may decrease the number of white blood cells, red blood cells and platelets. You may be at increased risk for infections and bleeding.  pain, redness, or irritation at site where injected  signs of infection - fever or chills, cough, sore throat, pain or difficulty passing urine  signs of decreased platelets or bleeding - bruising, pinpoint red spots on the skin, black, tarry stools, blood in the urine  swelling of the ankles, feet, hands  tiredness  weakness Side effects that usually do not require medical attention (report to your doctor or health care professional if they continue or are bothersome):  diarrhea  hair loss  mouth sores  nail discoloration or damage  nausea  red colored urine  vomiting This list may not describe all possible side effects. Call your doctor for medical advice about side effects. You may report  side effects to FDA at 1-800-FDA-1088. Where should I keep my medicine? This drug is given in a hospital or clinic and will not be stored at home. NOTE: This sheet is a summary. It may not cover all possible information. If you have questions about this medicine, talk to your doctor, pharmacist, or health care provider.  2020 Elsevier/Gold Standard (2017-07-09 11:01:26) Cyclophosphamide Injection What is this medicine? CYCLOPHOSPHAMIDE (sye kloe FOSS fa mide) is a chemotherapy drug. It slows the growth of cancer cells. This medicine is used to treat many types of cancer like lymphoma, myeloma, leukemia, breast cancer, and ovarian cancer, to name a few. This medicine may be used for other purposes; ask your health care provider or pharmacist if you have questions. COMMON BRAND NAME(S): Cytoxan, Neosar What should I tell my health care provider before I take this medicine? They need to know if you have any of these conditions:  heart disease  history of irregular heartbeat  infection  kidney disease  liver disease  low blood counts, like white cells, platelets, or red blood cells  on hemodialysis  recent or ongoing radiation therapy  scarring or thickening of the lungs  trouble passing urine  an   unusual or allergic reaction to cyclophosphamide, other medicines, foods, dyes, or preservatives  pregnant or trying to get pregnant  breast-feeding How should I use this medicine? This drug is usually given as an injection into a vein or muscle or by infusion into a vein. It is administered in a hospital or clinic by a specially trained health care professional. Talk to your pediatrician regarding the use of this medicine in children. Special care may be needed. Overdosage: If you think you have taken too much of this medicine contact a poison control center or emergency room at once. NOTE: This medicine is only for you. Do not share this medicine with others. What if I miss a  dose? It is important not to miss your dose. Call your doctor or health care professional if you are unable to keep an appointment. What may interact with this medicine?  amphotericin B  azathioprine  certain antivirals for HIV or hepatitis  certain medicines for blood pressure, heart disease, irregular heart beat  certain medicines that treat or prevent blood clots like warfarin  certain other medicines for cancer  cyclosporine  etanercept  indomethacin  medicines that relax muscles for surgery  medicines to increase blood counts  metronidazole This list may not describe all possible interactions. Give your health care provider a list of all the medicines, herbs, non-prescription drugs, or dietary supplements you use. Also tell them if you smoke, drink alcohol, or use illegal drugs. Some items may interact with your medicine. What should I watch for while using this medicine? Your condition will be monitored carefully while you are receiving this medicine. You may need blood work done while you are taking this medicine. Drink water or other fluids as directed. Urinate often, even at night. Some products may contain alcohol. Ask your health care professional if this medicine contains alcohol. Be sure to tell all health care professionals you are taking this medicine. Certain medicines, like metronidazole and disulfiram, can cause an unpleasant reaction when taken with alcohol. The reaction includes flushing, headache, nausea, vomiting, sweating, and increased thirst. The reaction can last from 30 minutes to several hours. Do not become pregnant while taking this medicine or for 1 year after stopping it. Women should inform their health care professional if they wish to become pregnant or think they might be pregnant. Men should not father a child while taking this medicine and for 4 months after stopping it. There is potential for serious side effects to an unborn child. Talk to your  health care professional for more information. Do not breast-feed an infant while taking this medicine or for 1 week after stopping it. This medicine has caused ovarian failure in some women. This medicine may make it more difficult to get pregnant. Talk to your health care professional if you are concerned about your fertility. This medicine has caused decreased sperm counts in some men. This may make it more difficult to father a child. Talk to your health care professional if you are concerned about your fertility. Call your health care professional for advice if you get a fever, chills, or sore throat, or other symptoms of a cold or flu. Do not treat yourself. This medicine decreases your body's ability to fight infections. Try to avoid being around people who are sick. Avoid taking medicines that contain aspirin, acetaminophen, ibuprofen, naproxen, or ketoprofen unless instructed by your health care professional. These medicines may hide a fever. Talk to your health care professional about your risk of cancer.   You may be more at risk for certain types of cancer if you take this medicine. If you are going to need surgery or other procedure, tell your health care professional that you are using this medicine. Be careful brushing or flossing your teeth or using a toothpick because you may get an infection or bleed more easily. If you have any dental work done, tell your dentist you are receiving this medicine. What side effects may I notice from receiving this medicine? Side effects that you should report to your doctor or health care professional as soon as possible:  allergic reactions like skin rash, itching or hives, swelling of the face, lips, or tongue  breathing problems  nausea, vomiting  signs and symptoms of bleeding such as bloody or black, tarry stools; red or dark brown urine; spitting up blood or brown material that looks like coffee grounds; red spots on the skin; unusual bruising  or bleeding from the eyes, gums, or nose  signs and symptoms of heart failure like fast, irregular heartbeat, sudden weight gain; swelling of the ankles, feet, hands  signs and symptoms of infection like fever; chills; cough; sore throat; pain or trouble passing urine  signs and symptoms of kidney injury like trouble passing urine or change in the amount of urine  signs and symptoms of liver injury like dark yellow or brown urine; general ill feeling or flu-like symptoms; light-colored stools; loss of appetite; nausea; right upper belly pain; unusually weak or tired; yellowing of the eyes or skin Side effects that usually do not require medical attention (report to your doctor or health care professional if they continue or are bothersome):  confusion  decreased hearing  diarrhea  facial flushing  hair loss  headache  loss of appetite  missed menstrual periods  signs and symptoms of low red blood cells or anemia such as unusually weak or tired; feeling faint or lightheaded; falls  skin discoloration This list may not describe all possible side effects. Call your doctor for medical advice about side effects. You may report side effects to FDA at 1-800-FDA-1088. Where should I keep my medicine? This drug is given in a hospital or clinic and will not be stored at home. NOTE: This sheet is a summary. It may not cover all possible information. If you have questions about this medicine, talk to your doctor, pharmacist, or health care provider.  2020 Elsevier/Gold Standard (2019-08-30 09:53:29) Pegfilgrastim injection What is this medicine? PEGFILGRASTIM (PEG fil gra stim) is a long-acting granulocyte colony-stimulating factor that stimulates the growth of neutrophils, a type of white blood cell important in the body's fight against infection. It is used to reduce the incidence of fever and infection in patients with certain types of cancer who are receiving chemotherapy that affects  the bone marrow, and to increase survival after being exposed to high doses of radiation. This medicine may be used for other purposes; ask your health care provider or pharmacist if you have questions. COMMON BRAND NAME(S): Fulphila, Neulasta, UDENYCA, Ziextenzo What should I tell my health care provider before I take this medicine? They need to know if you have any of these conditions:  kidney disease  latex allergy  ongoing radiation therapy  sickle cell disease  skin reactions to acrylic adhesives (On-Body Injector only)  an unusual or allergic reaction to pegfilgrastim, filgrastim, other medicines, foods, dyes, or preservatives  pregnant or trying to get pregnant  breast-feeding How should I use this medicine? This medicine is for injection   under the skin. If you get this medicine at home, you will be taught how to prepare and give the pre-filled syringe or how to use the On-body Injector. Refer to the patient Instructions for Use for detailed instructions. Use exactly as directed. Tell your healthcare provider immediately if you suspect that the On-body Injector may not have performed as intended or if you suspect the use of the On-body Injector resulted in a missed or partial dose. It is important that you put your used needles and syringes in a special sharps container. Do not put them in a trash can. If you do not have a sharps container, call your pharmacist or healthcare provider to get one. Talk to your pediatrician regarding the use of this medicine in children. While this drug may be prescribed for selected conditions, precautions do apply. Overdosage: If you think you have taken too much of this medicine contact a poison control center or emergency room at once. NOTE: This medicine is only for you. Do not share this medicine with others. What if I miss a dose? It is important not to miss your dose. Call your doctor or health care professional if you miss your dose. If you  miss a dose due to an On-body Injector failure or leakage, a new dose should be administered as soon as possible using a single prefilled syringe for manual use. What may interact with this medicine? Interactions have not been studied. Give your health care provider a list of all the medicines, herbs, non-prescription drugs, or dietary supplements you use. Also tell them if you smoke, drink alcohol, or use illegal drugs. Some items may interact with your medicine. This list may not describe all possible interactions. Give your health care provider a list of all the medicines, herbs, non-prescription drugs, or dietary supplements you use. Also tell them if you smoke, drink alcohol, or use illegal drugs. Some items may interact with your medicine. What should I watch for while using this medicine? You may need blood work done while you are taking this medicine. If you are going to need a MRI, CT scan, or other procedure, tell your doctor that you are using this medicine (On-Body Injector only). What side effects may I notice from receiving this medicine? Side effects that you should report to your doctor or health care professional as soon as possible:  allergic reactions like skin rash, itching or hives, swelling of the face, lips, or tongue  back pain  dizziness  fever  pain, redness, or irritation at site where injected  pinpoint red spots on the skin  red or dark-brown urine  shortness of breath or breathing problems  stomach or side pain, or pain at the shoulder  swelling  tiredness  trouble passing urine or change in the amount of urine Side effects that usually do not require medical attention (report to your doctor or health care professional if they continue or are bothersome):  bone pain  muscle pain This list may not describe all possible side effects. Call your doctor for medical advice about side effects. You may report side effects to FDA at 1-800-FDA-1088. Where  should I keep my medicine? Keep out of the reach of children. If you are using this medicine at home, you will be instructed on how to store it. Throw away any unused medicine after the expiration date on the label. NOTE: This sheet is a summary. It may not cover all possible information. If you have questions about this medicine,   talk to your doctor, pharmacist, or health care provider.  2020 Elsevier/Gold Standard (2018-03-02 16:57:08) Paclitaxel injection What is this medicine? PACLITAXEL (PAK li TAX el) is a chemotherapy drug. It targets fast dividing cells, like cancer cells, and causes these cells to die. This medicine is used to treat ovarian cancer, breast cancer, lung cancer, Kaposi's sarcoma, and other cancers. This medicine may be used for other purposes; ask your health care provider or pharmacist if you have questions. COMMON BRAND NAME(S): Onxol, Taxol What should I tell my health care provider before I take this medicine? They need to know if you have any of these conditions:  history of irregular heartbeat  liver disease  low blood counts, like low white cell, platelet, or red cell counts  lung or breathing disease, like asthma  tingling of the fingers or toes, or other nerve disorder  an unusual or allergic reaction to paclitaxel, alcohol, polyoxyethylated castor oil, other chemotherapy, other medicines, foods, dyes, or preservatives  pregnant or trying to get pregnant  breast-feeding How should I use this medicine? This drug is given as an infusion into a vein. It is administered in a hospital or clinic by a specially trained health care professional. Talk to your pediatrician regarding the use of this medicine in children. Special care may be needed. Overdosage: If you think you have taken too much of this medicine contact a poison control center or emergency room at once. NOTE: This medicine is only for you. Do not share this medicine with others. What if I miss  a dose? It is important not to miss your dose. Call your doctor or health care professional if you are unable to keep an appointment. What may interact with this medicine? Do not take this medicine with any of the following medications:  disulfiram  metronidazole This medicine may also interact with the following medications:  antiviral medicines for hepatitis, HIV or AIDS  certain antibiotics like erythromycin and clarithromycin  certain medicines for fungal infections like ketoconazole and itraconazole  certain medicines for seizures like carbamazepine, phenobarbital, phenytoin  gemfibrozil  nefazodone  rifampin  St. John's wort This list may not describe all possible interactions. Give your health care provider a list of all the medicines, herbs, non-prescription drugs, or dietary supplements you use. Also tell them if you smoke, drink alcohol, or use illegal drugs. Some items may interact with your medicine. What should I watch for while using this medicine? Your condition will be monitored carefully while you are receiving this medicine. You will need important blood work done while you are taking this medicine. This medicine can cause serious allergic reactions. To reduce your risk you will need to take other medicine(s) before treatment with this medicine. If you experience allergic reactions like skin rash, itching or hives, swelling of the face, lips, or tongue, tell your doctor or health care professional right away. In some cases, you may be given additional medicines to help with side effects. Follow all directions for their use. This drug may make you feel generally unwell. This is not uncommon, as chemotherapy can affect healthy cells as well as cancer cells. Report any side effects. Continue your course of treatment even though you feel ill unless your doctor tells you to stop. Call your doctor or health care professional for advice if you get a fever, chills or sore  throat, or other symptoms of a cold or flu. Do not treat yourself. This drug decreases your body's ability to fight infections. Try   to avoid being around people who are sick. This medicine may increase your risk to bruise or bleed. Call your doctor or health care professional if you notice any unusual bleeding. Be careful brushing and flossing your teeth or using a toothpick because you may get an infection or bleed more easily. If you have any dental work done, tell your dentist you are receiving this medicine. Avoid taking products that contain aspirin, acetaminophen, ibuprofen, naproxen, or ketoprofen unless instructed by your doctor. These medicines may hide a fever. Do not become pregnant while taking this medicine. Women should inform their doctor if they wish to become pregnant or think they might be pregnant. There is a potential for serious side effects to an unborn child. Talk to your health care professional or pharmacist for more information. Do not breast-feed an infant while taking this medicine. Men are advised not to father a child while receiving this medicine. This product may contain alcohol. Ask your pharmacist or healthcare provider if this medicine contains alcohol. Be sure to tell all healthcare providers you are taking this medicine. Certain medicines, like metronidazole and disulfiram, can cause an unpleasant reaction when taken with alcohol. The reaction includes flushing, headache, nausea, vomiting, sweating, and increased thirst. The reaction can last from 30 minutes to several hours. What side effects may I notice from receiving this medicine? Side effects that you should report to your doctor or health care professional as soon as possible:  allergic reactions like skin rash, itching or hives, swelling of the face, lips, or tongue  breathing problems  changes in vision  fast, irregular heartbeat  high or low blood pressure  mouth sores  pain, tingling, numbness in  the hands or feet  signs of decreased platelets or bleeding - bruising, pinpoint red spots on the skin, black, tarry stools, blood in the urine  signs of decreased red blood cells - unusually weak or tired, feeling faint or lightheaded, falls  signs of infection - fever or chills, cough, sore throat, pain or difficulty passing urine  signs and symptoms of liver injury like dark yellow or brown urine; general ill feeling or flu-like symptoms; light-colored stools; loss of appetite; nausea; right upper belly pain; unusually weak or tired; yellowing of the eyes or skin  swelling of the ankles, feet, hands  unusually slow heartbeat Side effects that usually do not require medical attention (report to your doctor or health care professional if they continue or are bothersome):  diarrhea  hair loss  loss of appetite  muscle or joint pain  nausea, vomiting  pain, redness, or irritation at site where injected  tiredness This list may not describe all possible side effects. Call your doctor for medical advice about side effects. You may report side effects to FDA at 1-800-FDA-1088. Where should I keep my medicine? This drug is given in a hospital or clinic and will not be stored at home. NOTE: This sheet is a summary. It may not cover all possible information. If you have questions about this medicine, talk to your doctor, pharmacist, or health care provider.  2020 Elsevier/Gold Standard (2017-07-29 13:14:55)  

## 2020-03-16 ENCOUNTER — Inpatient Hospital Stay (HOSPITAL_BASED_OUTPATIENT_CLINIC_OR_DEPARTMENT_OTHER): Payer: Medicaid Other | Admitting: Oncology

## 2020-03-16 ENCOUNTER — Other Ambulatory Visit: Payer: Self-pay

## 2020-03-16 ENCOUNTER — Inpatient Hospital Stay: Payer: Medicaid Other | Attending: Oncology

## 2020-03-16 DIAGNOSIS — Z171 Estrogen receptor negative status [ER-]: Secondary | ICD-10-CM | POA: Diagnosis not present

## 2020-03-16 DIAGNOSIS — R300 Dysuria: Secondary | ICD-10-CM | POA: Diagnosis not present

## 2020-03-16 DIAGNOSIS — R5383 Other fatigue: Secondary | ICD-10-CM | POA: Insufficient documentation

## 2020-03-16 DIAGNOSIS — Z5111 Encounter for antineoplastic chemotherapy: Secondary | ICD-10-CM | POA: Diagnosis present

## 2020-03-16 DIAGNOSIS — R110 Nausea: Secondary | ICD-10-CM | POA: Insufficient documentation

## 2020-03-16 DIAGNOSIS — C50212 Malignant neoplasm of upper-inner quadrant of left female breast: Secondary | ICD-10-CM | POA: Diagnosis not present

## 2020-03-16 DIAGNOSIS — Z5189 Encounter for other specified aftercare: Secondary | ICD-10-CM | POA: Insufficient documentation

## 2020-03-16 NOTE — Progress Notes (Signed)
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 ([place])   02/01/2020 Cancer Staging   Staging form: Breast, AJCC 8th Edition - Clinical stage from 02/01/2020: Stage IB (cT1c, cN0, cM0, G3, ER-, PR-, HER2-) - Signed by Lloyd Huger, MD on 02/01/2020   03/23/2020 -  Chemotherapy   The patient had DOXOrubicin (ADRIAMYCIN) chemo injection 88 mg, 60 mg/m2 = 88 mg, Intravenous,  Once, 0 of 4 cycles palonosetron (ALOXI) injection 0.25 mg, 0.25 mg, Intravenous,  Once, 0 of 4 cycles pegfilgrastim-jmdb (FULPHILA) injection 6 mg, 6 mg, Subcutaneous,  Once, 0 of 4 cycles cyclophosphamide (CYTOXAN) 880 mg in sodium chloride 0.9 % 250 mL chemo infusion, 600 mg/m2 = 880 mg, Intravenous,  Once, 0 of 4 cycles PACLitaxel (TAXOL) 120 mg in sodium chloride 0.9 % 250 mL chemo infusion (</= 55m/m2), 80 mg/m2 = 120 mg, Intravenous,  Once, 0 of 12 cycles fosaprepitant (EMEND) 150 mg in sodium chloride 0.9 % 145 mL IVPB, 150 mg, Intravenous,  Once, 0 of 4 cycles  for chemotherapy treatment.      Interval history-Deanna Ramsey a 54year old female who presents to chemo care clinic today for initial meeting in preparation for starting chemotherapy. I introduced the chemo care clinic and we discussed that the role of the clinic is to assist those who are at an increased risk of  emergency room visits and/or complications during the course of chemotherapy treatment. We discussed that the increased risk takes into account factors such as age, performance status, and co-morbidities. We also discussed that for some, this might include barriers to care such as not having a primary care provider, lack of insurance/transportation, or not being able to afford medications. We discussed that the goal of the program is to help prevent unplanned ER visits and help reduce complications during chemotherapy. We do this by discussing specific risk factors to each individual and identifying ways that we can help improve these risk factors and reduce barriers to care.   ECOG FS:0 - Asymptomatic  Review of systems- Review of Systems  Constitutional: Negative.  Negative for chills, fever, malaise/fatigue and weight loss.  HENT: Negative for congestion, ear pain and tinnitus.   Eyes: Negative.  Negative for blurred vision and double vision.  Respiratory: Negative.  Negative for cough, sputum production and shortness of breath.   Cardiovascular: Negative.  Negative for chest pain, palpitations and leg swelling.  Gastrointestinal: Negative.  Negative for abdominal pain, constipation, diarrhea, nausea and vomiting.  Genitourinary: Negative for dysuria, frequency and urgency.  Musculoskeletal: Negative for back pain and falls.  Skin: Negative.  Negative for rash.  Neurological: Negative.  Negative for weakness and headaches.  Endo/Heme/Allergies: Negative.  Does not bruise/bleed easily.  Psychiatric/Behavioral: Negative for depression. The patient is nervous/anxious. The patient does not have insomnia.      Current treatment- AC + Neulasta  Allergies  Allergen Reactions  . Sulfa Antibiotics  Nausea And Vomiting    Past Medical History:  Diagnosis Date  . Arthritis   . Cellulitis   . COPD (chronic obstructive pulmonary disease) ([place])   . Depression   . History of kidney stones   .  History of tracheostomy 2000   stated that epiglottis swelled up for an unknown reason; had trach for about a week.   . Hypertension   . Pneumonia     Past Surgical History:  Procedure Laterality Date  . arm surgery  1984   fractured ulna  . BREAST LUMPECTOMY WITH SENTINEL LYMPH NODE BIOPSY Left 02/04/2020   Procedure: BREAST LUMPECTOMY WITH SENTINEL LYMPH NODE BX;  Surgeon: Jules Husbands, MD;  Location: [facility] ORS;  Service: General;  Laterality: Left;  . CLEFT LIP REPAIR    . FRACTURE SURGERY    . kidney stones    . lymphnode neck    . PORTACATH PLACEMENT N/A 02/04/2020   Procedure: INSERTION PORT-A-CATH;  Surgeon: Jules Husbands, MD;  Location: [facility] ORS;  Service: General;  Laterality: N/A;  . RE-EXCISION OF BREAST LUMPECTOMY Left 02/24/2020   Procedure: RE-EXCISION OF Left BREAST LUMPECTOMY;  Surgeon: Jules Husbands, MD;  Location: [facility] ORS;  Service: General;  Laterality: Left;  . TYMPANOSTOMY TUBE PLACEMENT      Social History   Socioeconomic History  . Marital status: Single    Spouse name: Not on file  . Number of children: Not on file  . Years of education: Not on file  . Highest education level: Not on file  Occupational History  . Not on file  Tobacco Use  . Smoking status: Current Every Day Smoker    Packs/day: 1.00    Types: Cigarettes  . Smokeless tobacco: Never Used  Substance and Sexual Activity  . Alcohol use: No  . Drug use: No  . Sexual activity: Not on file  Other Topics Concern  . Not on file  Social History Narrative  . Not on file   Social Determinants of Health   Financial Resource Strain:   . Difficulty of Paying Living Expenses:   Food Insecurity:   . Worried About Charity fundraiser in the Last Year:   . Arboriculturist in the Last Year:   Transportation Needs:   . Film/video editor (Medical):   Marland Kitchen Lack of Transportation (Non-Medical):   Physical Activity:   . Days of Exercise per Week:   . Minutes of Exercise per Session:     Stress:   . Feeling of Stress :   Social Connections:   . Frequency of Communication with Friends and Family:   . Frequency of Social Gatherings with Friends and Family:   . Attends Religious Services:   . Active Member of Clubs or Organizations:   . Attends Archivist Meetings:   Marland Kitchen Marital Status:   Intimate Partner Violence:   . Fear of Current or Ex-Partner:   . Emotionally Abused:   Marland Kitchen Physically Abused:   . Sexually Abused:     Family History  Problem Relation Age of Onset  . COPD Other   . COPD Mother   . Hypertension Mother   . COPD Father   . Breast cancer Neg Hx      Current Outpatient Medications:  .  acetaminophen (TYLENOL) 325 MG tablet, Take 650 mg by mouth every 6 (six) hours as needed for moderate pain., Disp: , Rfl:  .  albuterol (PROVENTIL HFA;VENTOLIN HFA) 108 (90  Base) MCG/ACT inhaler, Inhale 2 puffs into the lungs every 6 (six) hours as needed for wheezing or shortness of breath., Disp: 1 Inhaler, Rfl: 2 .  cetirizine (ZYRTEC) 10 MG tablet, Take 10 mg by mouth daily., Disp: , Rfl:  .  fluticasone (FLOVENT HFA) 110 MCG/ACT inhaler, Inhale 2 puffs into the lungs daily. , Disp: , Rfl:  .  lidocaine-prilocaine (EMLA) cream, Apply to affected area once, Disp: 30 g, Rfl: 3 .  lisinopril-hydrochlorothiazide (ZESTORETIC) 20-12.5 MG tablet, Take 1 tablet by mouth daily., Disp: , Rfl:  .  prochlorperazine (COMPAZINE) 10 MG tablet, Take 1 tablet (10 mg total) by mouth every 6 (six) hours as needed (Nausea or vomiting)., Disp: 60 tablet, Rfl: 2  Physical exam: There were no vitals filed for this visit. Physical Exam Constitutional:      Appearance: Normal appearance.  HENT:     Head: Normocephalic and atraumatic.  Eyes:     Pupils: Pupils are equal, round, and reactive to light.  Cardiovascular:     Rate and Rhythm: Normal rate and regular rhythm.     Heart sounds: Normal heart sounds. No murmur.  Pulmonary:     Effort: Pulmonary effort is normal.      Breath sounds: Normal breath sounds. No wheezing.  Abdominal:     General: Bowel sounds are normal. There is no distension.     Palpations: Abdomen is soft.     Tenderness: There is no abdominal tenderness.  Musculoskeletal:        General: Normal range of motion.     Cervical back: Normal range of motion.  Skin:    General: Skin is warm and dry.     Findings: No rash.  Neurological:     Mental Status: She is alert and oriented to person, place, and time.  Psychiatric:        Judgment: Judgment normal.      CMP Latest Ref Rng & Units 02/02/2020  Glucose 70 - 99 mg/dL 134(H)  BUN 6 - 20 mg/dL 36(H)  Creatinine 0.44 - 1.00 mg/dL 1.28(H)  Sodium 135 - 145 mmol/L 138  Potassium 3.5 - 5.1 mmol/L 4.6  Chloride 98 - 111 mmol/L 104  CO2 22 - 32 mmol/L 23  Calcium 8.9 - 10.3 mg/dL 9.5  Total Protein 6.5 - 8.1 g/dL 8.2(H)  Total Bilirubin 0.3 - 1.2 mg/dL 0.6  Alkaline Phos 38 - 126 U/L 85  AST 15 - 41 U/L 19  ALT 0 - 44 U/L 11   CBC Latest Ref Rng & Units 02/02/2020  WBC 4.0 - 10.5 K/uL 7.5  Hemoglobin 12.0 - 15.0 g/dL 14.6  Hematocrit 36.0 - 46.0 % 44.7  Platelets 150 - 400 K/uL 386    No images are attached to the encounter.  No results found.   Assessment and plan- Patient is a 54 y.o. female who presents to [facility] for initial meeting in preparation for starting chemotherapy for the treatment of triple negative breast cancer.    1. HPI: Patient presented back in February 2021 for self palpated lump in her left breast.  Subsequent mammogram, ultrasound and biopsy revealed triple negative breast cancer.  Patient had lumpectomy with Dr. Perrin Maltese on 02/04/2020 with positive margins.  Had reexcision on 02/24/2020 with clear margins.  She also had a port placed.  Plan is for her to receive adjuvant chemo using Adriamycin and Cytoxan with the Udenyca support followed by weekly Taxol x12.  She did not require adjuvant XRT.  2. Chemo Care Clinic/High Risk for ER/Hospitalization  during chemotherapy- We discussed the role of the chemo care clinic and identified patient specific risk factors. I discussed that patient was identified as high risk primarily based on: Comorbidities  Patient has past medical history positive for: Past Medical History:  Diagnosis Date  . Arthritis   . Cellulitis   . COPD (chronic obstructive pulmonary disease) ([place])   . Depression   . History of kidney stones   . History of tracheostomy 2000   stated that epiglottis swelled up for an unknown reason; had trach for about a week.   . Hypertension   . Pneumonia     Patient has past surgical history positive for: Past Surgical History:  Procedure Laterality Date  . arm surgery  1984   fractured ulna  . BREAST LUMPECTOMY WITH SENTINEL LYMPH NODE BIOPSY Left 02/04/2020   Procedure: BREAST LUMPECTOMY WITH SENTINEL LYMPH NODE BX;  Surgeon: Jules Husbands, MD;  Location: [facility] ORS;  Service: General;  Laterality: Left;  . CLEFT LIP REPAIR    . FRACTURE SURGERY    . kidney stones    . lymphnode neck    . PORTACATH PLACEMENT N/A 02/04/2020   Procedure: INSERTION PORT-A-CATH;  Surgeon: Jules Husbands, MD;  Location: [facility] ORS;  Service: General;  Laterality: N/A;  . RE-EXCISION OF BREAST LUMPECTOMY Left 02/24/2020   Procedure: RE-EXCISION OF Left BREAST LUMPECTOMY;  Surgeon: Jules Husbands, MD;  Location: [facility] ORS;  Service: General;  Laterality: Left;  . TYMPANOSTOMY TUBE PLACEMENT      Based on our high risk symptom management report; this patient has a high risk of ED utilization.  The percentage below indicates how "at risk "  this patient based on the factors in this table within one year.   General Risk Score: 3  Values used to calculate this score:   Points  Metrics      0        Age: 89      2        Hospital Admissions: 2      0        ED Visits: 0      0        Has Chronic Obstructive Pulmonary Disease: No      0        Has Diabetes: No      0        Has Congestive Heart Failure:  No      0        Has liver disease: No      0        Has Depression: No      0        Current PCP: Letta Median, MD      1        Has Medicaid: Yes   3. We discussed that social determinants of health may have significant impacts on health and outcomes for cancer patients.  Today we discussed specific social determinants of performance status, alcohol use, depression, financial needs, food insecurity, housing, interpersonal violence, social connections, stress, tobacco use, and transportation.    After lengthy discussion the following were identified as areas of need:   Mrs. Reposa has several needs.  Patient would benefit from using our [place] for transportation.  She has already submitted necessary paperwork for all future appointments.  Anne from [facility] have been helping her find available resources.  She  also has received a [employer] coupon and has been able to pick up groceries.  She has been asked to provide certain documentation for help with her rent and other bills.  She has already been in contact with Barnabas Lister crater who will help her moving forward.  Outpatient services: We discussed options including home based and outpatient services, DME and care program. We discusssed that patients who participate in regular physical activity report fewer negative impacts of cancer and treatments and report less fatigue.   Financial Concerns: We discussed that living with cancer can create tremendous financial burden.  We discussed options for assistance. I asked that if assistance is needed in affording medications or paying bills to please let us know so that we can provide assistance. We discussed options for food including social services, [employer] ($50 every 2 weeks) and onsite food pantry.  We will also notify Barnabas Lister crater to see if cancer center can provide additional support.  Referral to Social work: Introduced Education officer, museum Elease Etienne and the services he can  provide such as support with [employer], cell phone and gas vouchers.   Support groups: We discussed options for support groups at the cancer center. If interested, please notify nurse navigator to enroll. We discussed options for managing stress including healthy eating, exercise as well as participating in no charge counseling services at the cancer center and support groups.  If these are of interest, patient can notify either myself or primary nursing team.We discussed options for management including medications and referral to quit Smart program  Transportation: We discussed options for transportation including acta, paratransit, bus routes, link transit, taxi/uber/lyft, and cancer center [place].  I have notified primary oncology team who will help assist with arranging Lucianne Lei transportation for appointments when/if needed. We also discussed options for transportation on short notice/acute visits.  Palliative care services: We have palliative care services available in the cancer center to discuss goals of care and advanced care planning.  Please let us know if you have any questions or would like to speak to our palliative nurse practitioner.  Symptom Management Clinic: We discussed our symptom management clinic which is available for acute concerns while receiving treatment such as nausea, vomiting or diarrhea.  We can be reached via telephone at [phone] or through my chart.  We are available for virtual or in person visits on the same day from 830 to 4 PM Monday through Friday. She denies needing specific assistance at this time and She will be followed by Dr. Virgel Manifold clinical team.  Plan: Discussed symptom management clinic. Discussed palliative care services. Discussed resources that are available here at the cancer center. Discussed medications and new prescriptions to begin treatment such as anti-nausea or steroids.   Disposition: RTC on 03/21/20 for MUGA.  RTC on 03/23/20 for labs and  cycle 1 AC.   Visit Diagnosis 1. 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 She also understands that She can call clinic at any time with any questions, concerns, or complaints.   Greater than 50% was spent in counseling and coordination of care with this patient including but not limited to discussion of the relevant topics above (See A&P) including, but not limited to diagnosis and management of acute and chronic medical conditions.   Rulon Abide AGNP-C [facility]  CC: Dr/ [name]

## 2020-03-16 NOTE — Progress Notes (Signed)
Pharmacist Chemotherapy Monitoring - Initial Assessment    Anticipated start date: 03/23/20    Regimen:  . Are orders appropriate based on the patient's diagnosis, regimen, and cycle? Yes . Does the plan date match the patient's scheduled date? Yes . Is the sequencing of drugs appropriate? Yes . Are the premedications appropriate for the patient's regimen? Yes . Prior Authorization for treatment is: Approved o If applicable, is the correct biosimilar selected based on the patient's insurance? not applicable  Organ Function and Labs: Marland Kitchen Are dose adjustments needed based on the patient's renal function, hepatic function, or hematologic function? Yes . Are appropriate labs ordered prior to the start of patient's treatment? Yes . Other organ system assessment, if indicated:Not assessed yet, planned . The following baseline labs, if indicated, have been ordered: N/A  Dose Assessment: . Are the drug doses appropriate? Yes . Are the following correct: o Drug concentrations Yes o IV fluid compatible with drug Yes o Administration routes Yes o Timing of therapy Yes . If applicable, does the patient have documented access for treatment and/or plans for port-a-cath placement? no . If applicable, have lifetime cumulative doses been properly documented and assessed? not applicable Lifetime Dose Tracking  No doses have been documented on this patient for the following tracked chemicals: Doxorubicin, Epirubicin, Idarubicin, Daunorubicin, Mitoxantrone, Bleomycin, Oxaliplatin, Carboplatin, Liposomal Doxorubicin  o   Toxicity Monitoring/Prevention: . The patient has the following take home antiemetics prescribed: Ondansetron and Prochlorperazine . The patient has the following take home medications prescribed: N/A . Medication allergies and previous infusion related reactions, if applicable, have been reviewed and addressed. Yes . The patient's current medication list has been assessed for drug-drug  interactions with their chemotherapy regimen. no significant drug-drug interactions were identified on review.  Order Review: . Are the treatment plan orders signed? Yes . Is the patient scheduled to see a provider prior to their treatment? Yes  I verify that I have reviewed each item in the above checklist and answered each question accordingly.  Marissia Blackham K 03/16/2020 9:12 AM

## 2020-03-17 NOTE — Progress Notes (Signed)
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  INTERVAL HISTORY: Patient returns to clinic today for further evaluation and initiation of cycle 1 of 4 of Adriamycin and Cytoxan.  She has increased anxiety and insomnia, but otherwise feels well.  She has no neurologic complaints.  She denies any recent fevers or illnesses.  She has a good appetite and denies weight loss.  She has no chest pain, shortness of breath, cough, or hemoptysis.  She denies any nausea, vomiting, constipation, or diarrhea.  She has no urinary complaints.  Patient offers no further specific complaints today.  REVIEW OF SYSTEMS:   Review of Systems  Constitutional: Negative.  Negative for fever, malaise/fatigue and weight loss.  Respiratory: Negative.  Negative for cough, hemoptysis and shortness of breath.   Cardiovascular: Negative.  Negative for chest pain and leg swelling.  Gastrointestinal: Negative.  Negative for abdominal pain.  Genitourinary: Negative.  Negative for dysuria.  Musculoskeletal: Negative.  Negative for back pain.  Skin: Negative.  Negative for rash.  Neurological: Negative.  Negative for dizziness, focal weakness, weakness and headaches.  Psychiatric/Behavioral: The patient is nervous/anxious and has insomnia.     As per HPI. Otherwise, a complete review of systems is negative.  PAST MEDICAL HISTORY: Past Medical History:  Diagnosis Date  . Arthritis   . Cellulitis   . COPD (chronic obstructive pulmonary disease) ([place])   . Depression   . History of kidney stones   . History of tracheostomy 2000   stated that  epiglottis swelled up for an unknown reason; had trach for about a week.   . Hypertension   . Pneumonia     PAST SURGICAL HISTORY: Past Surgical History:  Procedure Laterality Date  . arm surgery  1984   fractured ulna  . BREAST LUMPECTOMY WITH SENTINEL LYMPH NODE BIOPSY Left 02/04/2020   Procedure: BREAST LUMPECTOMY WITH SENTINEL LYMPH NODE BX;  Surgeon: Jules Husbands, MD;  Location: [facility] ORS;  Service: General;  Laterality: Left;  . CLEFT LIP REPAIR    . FRACTURE SURGERY    . kidney stones    . lymphnode neck    . PORTACATH PLACEMENT N/A 02/04/2020   Procedure: INSERTION PORT-A-CATH;  Surgeon: Jules Husbands, MD;  Location: [facility] ORS;  Service: General;  Laterality: N/A;  . RE-EXCISION OF BREAST LUMPECTOMY Left 02/24/2020   Procedure: RE-EXCISION OF Left BREAST LUMPECTOMY;  Surgeon: Jules Husbands, MD;  Location: [facility] ORS;  Service: General;  Laterality: Left;  . TYMPANOSTOMY TUBE PLACEMENT      FAMILY HISTORY: Family History  Problem Relation Age of Onset  . COPD Other   . COPD Mother   . Hypertension Mother   . COPD Father   . Breast cancer Neg Hx     ADVANCED DIRECTIVES (Y/N):  N  HEALTH MAINTENANCE: Social History   Tobacco Use  . Smoking status: Current Every Day Smoker    Packs/day: 1.00    Types: Cigarettes  . Smokeless tobacco: Never Used  Substance Use Topics  . Alcohol use: No  . Drug use: No     Colonoscopy:  PAP:  Bone density:  Lipid panel:  Allergies  Allergen Reactions  . Sulfa Antibiotics Nausea And Vomiting    Current Outpatient Medications  Medication Sig Dispense Refill  . acetaminophen (TYLENOL) 325 MG tablet Take 650 mg by mouth every 6 (six) hours as needed for moderate pain.    Marland Kitchen albuterol (PROVENTIL HFA;VENTOLIN HFA) 108 (90 Base) MCG/ACT inhaler Inhale 2 puffs into the lungs every 6 (six) hours as needed for wheezing or shortness of breath. 1 Inhaler 2  . cetirizine (ZYRTEC) 10 MG tablet Take 10 mg by mouth daily.    . fluticasone  (FLOVENT HFA) 110 MCG/ACT inhaler Inhale 2 puffs into the lungs daily.     Marland Kitchen lidocaine-prilocaine (EMLA) cream Apply to affected area once 30 g 3  . lisinopril-hydrochlorothiazide (ZESTORETIC) 20-12.5 MG tablet Take 1 tablet by mouth daily.    . prochlorperazine (COMPAZINE) 10 MG tablet Take 1 tablet (10 mg total) by mouth every 6 (six) hours as needed (Nausea or vomiting). 60 tablet 2  . ALPRAZolam (XANAX) 0.25 MG tablet Take 1 tablet (0.25 mg total) by mouth at bedtime as needed for anxiety. 30 tablet 0   No current facility-administered medications for this visit.   Facility-Administered Medications Ordered in Other Visits  Medication Dose Route Frequency Provider Last Rate Last Admin  . cyclophosphamide (CYTOXAN) 880 mg in sodium chloride 0.9 % 250 mL chemo infusion  600 mg/m2 (Treatment Plan Recorded) Intravenous Once Lloyd Huger, MD      . dexamethasone (DECADRON) 10 mg in sodium chloride 0.9 % 50 mL IVPB  10 mg Intravenous Once Lloyd Huger, MD      . DOXOrubicin (ADRIAMYCIN) chemo injection 88 mg  60 mg/m2 (Treatment Plan Recorded) Intravenous Once Lloyd Huger, MD      . fosaprepitant (EMEND) 150 mg in sodium chloride 0.9 % 145 mL IVPB  150 mg Intravenous Once Lloyd Huger, MD      . heparin lock flush 100 unit/mL  500 Units Intravenous Once Lloyd Huger, MD      . heparin lock flush 100 unit/mL  500 Units Intracatheter Once PRN Lloyd Huger, MD      . palonosetron (ALOXI) injection 0.25 mg  0.25 mg Intravenous Once Lloyd Huger, MD      . sodium chloride flush (NS) 0.9 % injection 10 mL  10 mL Intravenous PRN Lloyd Huger, MD   10 mL at 03/23/20 0853    OBJECTIVE: Vitals:   03/23/20 0926  BP: 110/64  Pulse: 62  Resp: 20  Temp: (!) 94.8 F (34.9 C)  SpO2: 100%     Body mass index is 17.29 kg/m.    ECOG FS:0 - Asymptomatic  General: Well-developed, well-nourished, no acute distress. Eyes: Pink conjunctiva, anicteric  sclera. HEENT: Normocephalic, moist mucous membranes. Lungs: No audible wheezing or coughing. Heart: Regular rate and rhythm. Abdomen: Soft, nontender, no obvious distention. Musculoskeletal: No edema, cyanosis, or clubbing. Neuro: Alert, answering all questions appropriately. Cranial nerves grossly intact. Skin: No rashes or petechiae noted. Psych: Normal affect.  LAB RESULTS:  Lab Results  Component Value Date   NA 138 02/02/2020   K 4.6 02/02/2020   CL 104 02/02/2020   CO2 23 02/02/2020   GLUCOSE 134 (H) 02/02/2020   BUN 36 (H) 02/02/2020   CREATININE 1.28 (H) 02/02/2020   CALCIUM 9.5 02/02/2020   PROT 8.2 (H) 02/02/2020   ALBUMIN 4.7 02/02/2020   AST 19 02/02/2020   ALT 11 02/02/2020   ALKPHOS 85 02/02/2020  BILITOT 0.6 02/02/2020   GFRNONAA 48 (L) 02/02/2020   GFRAA 55 (L) 02/02/2020    Lab Results  Component Value Date   WBC 7.9 03/23/2020   NEUTROABS 3.7 03/23/2020   HGB 12.7 03/23/2020   HCT 39.0 03/23/2020   MCV 96.3 03/23/2020   PLT 339 03/23/2020     STUDIES: NM Cardiac Muga Rest  Result Date: 03/22/2020 CLINICAL DATA:  Breast cancer, pre chemotherapy evaluation EXAM: NUCLEAR MEDICINE CARDIAC BLOOD POOL IMAGING (MUGA) TECHNIQUE: Cardiac multi-gated acquisition was performed at rest following intravenous injection of Tc-22mlabeled red blood cells. RADIOPHARMACEUTICALS:  22.08 mCi Tc-922mertechnetate in-vitro labeled red blood cells IV COMPARISON:  None FINDINGS: Calculated LEFT ventricular ejection fraction is 72%, normal. Study was obtained at a cardiac rate of 66 bpm. Patient was fairly rhythmic during imaging. Cine analysis of the LEFT ventricle in 3 projections demonstrates normal LV wall motion. IMPRESSION: Normal LEFT ventricular ejection fraction of 72%. Normal LV wall motion. Electronically Signed   By: MaLavonia Dana.D.   On: 03/22/2020 07:31    ASSESSMENT: Pathologic stage IB triple negative invasive carcinoma of the upper inner quadrant of left  breast.  PLAN:    1.  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  Because of positive margins she underwent reexcision on February 24, 2020 with clear margins.  She has now had a port placed as well.  MUGA scan from March 22, 2020 reported an EF of 72%. Given the triple negative status of her disease, she will then receive adjuvant chemotherapy using Adriamycin and Cytoxan with Udenyca support followed by weekly Taxol x12.  Patient will also require adjuvant XRT at the conclusion of her chemotherapy.  An aromatase inhibitor would not offer any benefit given the triple negative status of her disease.  Genetics referral has been placed.  Proceed with cycle 1 of 4 of Adriamycin and Cytoxan today.  Return to clinic tomorrow for [place] 1 week for laboratory work and further evaluation, and then in 2 weeks for further evaluation and consideration of cycle 2. 2.  Anxiety/insomnia: Patient was given a prescription for Xanax today.   Patient expressed understanding and was in agreement with this plan. She also understands that She can call clinic at any time with any questions, concerns, or complaints.   Cancer Staging Carcinoma of upper-inner quadrant of left female breast [facility]: Breast, AJCC 8th Edition - Clinical stage from 02/01/2020: Stage IB (cT1c, cN0, cM0, G3, ER-, PR-, HER2-) - Signed by FiLloyd HugerMD on 02/01/2020   TiLloyd HugerMD   03/23/2020 9:45 AM

## 2020-03-21 ENCOUNTER — Other Ambulatory Visit: Payer: Self-pay

## 2020-03-21 ENCOUNTER — Encounter
Admission: RE | Admit: 2020-03-21 | Discharge: 2020-03-21 | Disposition: A | Discharge status: Home / Self Care | Payer: Medicaid Other | Source: Ambulatory Visit | Attending: Oncology | Admitting: Oncology

## 2020-03-21 DIAGNOSIS — C50212 Malignant neoplasm of upper-inner quadrant of left female breast: Secondary | ICD-10-CM | POA: Insufficient documentation

## 2020-03-21 DIAGNOSIS — Z171 Estrogen receptor negative status [ER-]: Secondary | ICD-10-CM | POA: Diagnosis present

## 2020-03-21 MED ORDER — TECHNETIUM TC 99M-LABELED RED BLOOD CELLS IV KIT
20.0000 | PACK | Freq: Once | INTRAVENOUS | Status: AC | PRN
  Administered 2020-03-21: 11:00:00 22.08 via INTRAVENOUS

## 2020-03-22 ENCOUNTER — Encounter: Payer: Self-pay | Admitting: Oncology

## 2020-03-23 ENCOUNTER — Encounter: Payer: Self-pay | Admitting: Oncology

## 2020-03-23 ENCOUNTER — Inpatient Hospital Stay: Payer: Medicaid Other

## 2020-03-23 ENCOUNTER — Inpatient Hospital Stay (HOSPITAL_BASED_OUTPATIENT_CLINIC_OR_DEPARTMENT_OTHER): Payer: Medicaid Other | Admitting: Oncology

## 2020-03-23 ENCOUNTER — Other Ambulatory Visit: Payer: Self-pay

## 2020-03-23 ENCOUNTER — Encounter: Payer: Self-pay | Admitting: Emergency Medicine

## 2020-03-23 VITALS — BP 110/64 | HR 62 | Temp 94.8°F | Resp 20 | Wt 105.5 lb

## 2020-03-23 DIAGNOSIS — C50212 Malignant neoplasm of upper-inner quadrant of left female breast: Secondary | ICD-10-CM

## 2020-03-23 DIAGNOSIS — Z171 Estrogen receptor negative status [ER-]: Secondary | ICD-10-CM | POA: Diagnosis not present

## 2020-03-23 DIAGNOSIS — Z5111 Encounter for antineoplastic chemotherapy: Secondary | ICD-10-CM | POA: Diagnosis not present

## 2020-03-23 LAB — CBC WITH DIFFERENTIAL/PLATELET
Abs Immature Granulocytes: 0.03 10*3/uL (ref 0.00–0.07)
Basophils Absolute: 0.1 10*3/uL (ref 0.0–0.1)
Basophils Relative: 1 %
Eosinophils Absolute: 0.5 10*3/uL (ref 0.0–0.5)
Eosinophils Relative: 6 %
HCT: 39 % (ref 36.0–46.0)
Hemoglobin: 12.7 g/dL (ref 12.0–15.0)
Immature Granulocytes: 0 %
Lymphocytes Relative: 38 %
Lymphs Abs: 3 10*3/uL (ref 0.7–4.0)
MCH: 31.4 pg (ref 26.0–34.0)
MCHC: 32.6 g/dL (ref 30.0–36.0)
MCV: 96.3 fL (ref 80.0–100.0)
Monocytes Absolute: 0.6 10*3/uL (ref 0.1–1.0)
Monocytes Relative: 8 %
Neutro Abs: 3.7 10*3/uL (ref 1.7–7.7)
Neutrophils Relative %: 47 %
Platelets: 339 10*3/uL (ref 150–400)
RBC: 4.05 MIL/uL (ref 3.87–5.11)
RDW: 12.7 % (ref 11.5–15.5)
WBC: 7.9 10*3/uL (ref 4.0–10.5)
nRBC: 0 % (ref 0.0–0.2)

## 2020-03-23 LAB — COMPREHENSIVE METABOLIC PANEL
ALT: 13 U/L (ref 0–44)
AST: 21 U/L (ref 15–41)
Albumin: 3.8 g/dL (ref 3.5–5.0)
Alkaline Phosphatase: 86 U/L (ref 38–126)
Anion gap: 9 (ref 5–15)
BUN: 40 mg/dL — ABNORMAL HIGH (ref 6–20)
CO2: 23 mmol/L (ref 22–32)
Calcium: 9.1 mg/dL (ref 8.9–10.3)
Chloride: 106 mmol/L (ref 98–111)
Creatinine, Ser: 1 mg/dL (ref 0.44–1.00)
GFR calc Af Amer: >60 mL/min (ref >60)
GFR calc non Af Amer: >60 mL/min (ref >60)
Glucose, Bld: 96 mg/dL (ref 70–99)
Potassium: 4.5 mmol/L (ref 3.5–5.1)
Sodium: 138 mmol/L (ref 135–145)
Total Bilirubin: 0.5 mg/dL (ref 0.3–1.2)
Total Protein: 6.9 g/dL (ref 6.5–8.1)

## 2020-03-23 MED ORDER — SODIUM CHLORIDE 0.9 % IV SOLN
600.0000 mg/m2 | Freq: Once | INTRAVENOUS | Status: AC
  Administered 2020-03-23: 880 mg via INTRAVENOUS
  Filled 2020-03-23: qty 44

## 2020-03-23 MED ORDER — DOXORUBICIN HCL CHEMO IV INJECTION 2 MG/ML
60.0000 mg/m2 | Freq: Once | INTRAVENOUS | Status: AC
  Administered 2020-03-23: 88 mg via INTRAVENOUS
  Filled 2020-03-23: qty 25

## 2020-03-23 MED ORDER — SODIUM CHLORIDE 0.9 % IV SOLN
150.0000 mg | Freq: Once | INTRAVENOUS | Status: AC
  Administered 2020-03-23: 150 mg via INTRAVENOUS
  Filled 2020-03-23: qty 150

## 2020-03-23 MED ORDER — SODIUM CHLORIDE 0.9 % IV SOLN
10.0000 mg | Freq: Once | INTRAVENOUS | Status: AC
  Administered 2020-03-23: 10 mg via INTRAVENOUS
  Filled 2020-03-23: qty 10

## 2020-03-23 MED ORDER — SODIUM CHLORIDE 0.9% FLUSH
10.0000 mL | INTRAVENOUS | Status: DC | PRN
  Administered 2020-03-23: 10 mL via INTRAVENOUS
  Filled 2020-03-23: qty 10

## 2020-03-23 MED ORDER — ALPRAZOLAM 0.25 MG PO TABS: 0.2500 mg | ORAL_TABLET | Freq: Every evening | ORAL | 0 refills | Status: DC | PRN

## 2020-03-23 MED ORDER — HEPARIN SOD (PORK) LOCK FLUSH 100 UNIT/ML IV SOLN
500.0000 [IU] | Freq: Once | INTRAVENOUS | Status: DC
  Filled 2020-03-23: qty 5

## 2020-03-23 MED ORDER — PALONOSETRON HCL INJECTION 0.25 MG/5ML
0.2500 mg | Freq: Once | INTRAVENOUS | Status: AC
  Administered 2020-03-23: 0.25 mg via INTRAVENOUS
  Filled 2020-03-23: qty 5

## 2020-03-23 MED ORDER — SODIUM CHLORIDE 0.9 % IV SOLN
Freq: Once | INTRAVENOUS | Status: AC
  Filled 2020-03-23: qty 250

## 2020-03-23 MED ORDER — HEPARIN SOD (PORK) LOCK FLUSH 100 UNIT/ML IV SOLN
500.0000 [IU] | Freq: Once | INTRAVENOUS | Status: AC | PRN
  Administered 2020-03-23: 500 [IU]
  Filled 2020-03-23: qty 5

## 2020-03-24 ENCOUNTER — Inpatient Hospital Stay: Payer: Medicaid Other

## 2020-03-24 ENCOUNTER — Telehealth: Payer: Self-pay

## 2020-03-24 DIAGNOSIS — C50212 Malignant neoplasm of upper-inner quadrant of left female breast: Secondary | ICD-10-CM

## 2020-03-24 DIAGNOSIS — Z5111 Encounter for antineoplastic chemotherapy: Secondary | ICD-10-CM | POA: Diagnosis not present

## 2020-03-24 MED ORDER — PEGFILGRASTIM-JMDB 6 MG/0.6ML ~~LOC~~ SOSY
6.0000 mg | PREFILLED_SYRINGE | Freq: Once | SUBCUTANEOUS | Status: AC
  Administered 2020-03-24: 6 mg via SUBCUTANEOUS
  Filled 2020-03-24: qty 0.6

## 2020-03-24 NOTE — Telephone Encounter (Signed)
Telephone call to pt for follow up after receiving first infusion yesterday.   No answer but left message to let her know I was calling to check on her and to call for any questions or concerns.

## 2020-03-24 NOTE — Progress Notes (Signed)
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  INTERVAL HISTORY: Patient returns to clinic today for further evaluation and to assess her toleration of cycle 1 of Adriamycin and Cytoxan.  Patient reports extreme fatigue over the past week, but otherwise felt well.  She had occasional nausea that was well controlled with her current prescriptions.  She has no neurologic complaints.  She denies any recent fevers or illnesses.  She has a good appetite and denies weight loss.  She has no chest pain, shortness of breath, cough, or hemoptysis.  She denies any nausea, vomiting, constipation, or diarrhea.  She has no urinary complaints.  Patient offers no further specific complaints today.    REVIEW OF SYSTEMS:   Review of Systems  Constitutional: Positive for malaise/fatigue. Negative for fever and weight loss.  Respiratory: Negative.  Negative for cough, hemoptysis and shortness of breath.   Cardiovascular: Negative.  Negative for chest pain and leg swelling.  Gastrointestinal: Positive for nausea. Negative for abdominal pain.  Genitourinary: Negative.  Negative for dysuria.  Musculoskeletal: Negative.  Negative for back pain.  Skin: Negative.  Negative for rash.  Neurological: Negative.  Negative for dizziness, focal weakness, weakness and headaches.  Psychiatric/Behavioral: Negative.  The patient is not nervous/anxious and does not have insomnia.     As per HPI. Otherwise, a complete review of systems is negative.  PAST MEDICAL HISTORY: Past Medical  History:  Diagnosis Date  . Arthritis   . Cellulitis   . COPD (chronic obstructive pulmonary disease) ([place])   . Depression   . History of kidney stones   . History of tracheostomy 2000   stated that epiglottis swelled up for an unknown reason; had trach for about a week.   . Hypertension   . Pneumonia     PAST SURGICAL HISTORY: Past Surgical History:  Procedure Laterality Date  . arm surgery  1984   fractured ulna  . BREAST LUMPECTOMY WITH SENTINEL LYMPH NODE BIOPSY Left 02/04/2020   Procedure: BREAST LUMPECTOMY WITH SENTINEL LYMPH NODE BX;  Surgeon: Jules Husbands, MD;  Location: [facility] ORS;  Service: General;  Laterality: Left;  . CLEFT LIP REPAIR    . FRACTURE SURGERY    . kidney stones    . lymphnode neck    . PORTACATH PLACEMENT N/A 02/04/2020   Procedure: INSERTION PORT-A-CATH;  Surgeon: Jules Husbands, MD;  Location: [facility] ORS;  Service: General;  Laterality: N/A;  . RE-EXCISION OF BREAST LUMPECTOMY Left 02/24/2020   Procedure: RE-EXCISION OF Left BREAST LUMPECTOMY;  Surgeon: Jules Husbands, MD;  Location: [facility] ORS;  Service: General;  Laterality: Left;  . TYMPANOSTOMY TUBE PLACEMENT      FAMILY HISTORY: Family History  Problem Relation Age of Onset  . COPD Other   . COPD Mother   . Hypertension Mother   . COPD Father   . Breast cancer Neg Hx     ADVANCED DIRECTIVES (Y/N):  N  HEALTH MAINTENANCE: Social History   Tobacco Use  . Smoking status: Current Every Day Smoker    Packs/day: 1.00    Types: Cigarettes  .  Smokeless tobacco: Never Used  Substance Use Topics  . Alcohol use: No  . Drug use: No     Colonoscopy:  PAP:  Bone density:  Lipid panel:  Allergies  Allergen Reactions  . Sulfa Antibiotics Nausea And Vomiting    Current Outpatient Medications  Medication Sig Dispense Refill  . acetaminophen (TYLENOL) 325 MG tablet Take 650 mg by mouth every 6 (six) hours as needed for moderate pain.    Marland Kitchen albuterol (PROVENTIL HFA;VENTOLIN HFA) 108 (90 Base)  MCG/ACT inhaler Inhale 2 puffs into the lungs every 6 (six) hours as needed for wheezing or shortness of breath. 1 Inhaler 2  . ALPRAZolam (XANAX) 0.25 MG tablet Take 1 tablet (0.25 mg total) by mouth at bedtime as needed for anxiety. 30 tablet 0  . cetirizine (ZYRTEC) 10 MG tablet Take 10 mg by mouth daily.    . fluticasone (FLOVENT HFA) 110 MCG/ACT inhaler Inhale 2 puffs into the lungs daily.     Marland Kitchen lidocaine-prilocaine (EMLA) cream Apply to affected area once 30 g 3  . lisinopril-hydrochlorothiazide (ZESTORETIC) 20-12.5 MG tablet Take 1 tablet by mouth daily.    . prochlorperazine (COMPAZINE) 10 MG tablet Take 1 tablet (10 mg total) by mouth every 6 (six) hours as needed (Nausea or vomiting). 60 tablet 2   No current facility-administered medications for this visit.    OBJECTIVE: Vitals:   03/29/20 0922  BP: 91/68  Pulse: 92  Resp: 18  Temp: 98.8 F (37.1 C)  SpO2: 100%     Body mass index is 17.26 kg/m.    ECOG FS:0 - Asymptomatic  General: Well-developed, well-nourished, no acute distress. Eyes: Pink conjunctiva, anicteric sclera. HEENT: Normocephalic, moist mucous membranes. Lungs: No audible wheezing or coughing. Heart: Regular rate and rhythm. Abdomen: Soft, nontender, no obvious distention. Musculoskeletal: No edema, cyanosis, or clubbing. Neuro: Alert, answering all questions appropriately. Cranial nerves grossly intact. Skin: No rashes or petechiae noted. Psych: Normal affect.   LAB RESULTS:  Lab Results  Component Value Date   NA 137 03/29/2020   K 4.5 03/29/2020   CL 102 03/29/2020   CO2 26 03/29/2020   GLUCOSE 113 (H) 03/29/2020   BUN 33 (H) 03/29/2020   CREATININE 0.91 03/29/2020   CALCIUM 9.1 03/29/2020   PROT 6.8 03/29/2020   ALBUMIN 3.7 03/29/2020   AST 13 (L) 03/29/2020   ALT 9 03/29/2020   ALKPHOS 125 03/29/2020   BILITOT 0.8 03/29/2020   GFRNONAA >60 03/29/2020   GFRAA >60 03/29/2020    Lab Results  Component Value Date   WBC 3.4 (L)  03/29/2020   NEUTROABS 1.9 03/29/2020   HGB 12.5 03/29/2020   HCT 37.3 03/29/2020   MCV 95.6 03/29/2020   PLT 176 03/29/2020     STUDIES: NM Cardiac Muga Rest  Result Date: 03/22/2020 CLINICAL DATA:  Breast cancer, pre chemotherapy evaluation EXAM: NUCLEAR MEDICINE CARDIAC BLOOD POOL IMAGING (MUGA) TECHNIQUE: Cardiac multi-gated acquisition was performed at rest following intravenous injection of Tc-73mlabeled red blood cells. RADIOPHARMACEUTICALS:  22.08 mCi Tc-928mertechnetate in-vitro labeled red blood cells IV COMPARISON:  None FINDINGS: Calculated LEFT ventricular ejection fraction is 72%, normal. Study was obtained at a cardiac rate of 66 bpm. Patient was fairly rhythmic during imaging. Cine analysis of the LEFT ventricle in 3 projections demonstrates normal LV wall motion. IMPRESSION: Normal LEFT ventricular ejection fraction of 72%. Normal LV wall motion. Electronically Signed   By: MaLavonia Dana.D.   On: 03/22/2020 07:31    ASSESSMENT: Pathologic  stage IB triple negative invasive carcinoma of the upper inner quadrant of left breast.  PLAN:    1.  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  Because of positive margins she underwent reexcision on February 24, 2020 with clear margins.  She has now had a port placed as well.  MUGA scan from March 22, 2020 reported an EF of 72%. Given the triple negative status of her disease, she will then receive adjuvant chemotherapy using Adriamycin and Cytoxan with Udenyca support followed by weekly Taxol x12.  Patient will also require adjuvant XRT at the conclusion of her chemotherapy.  An aromatase inhibitor would not offer any benefit given the triple negative status of her disease.  Genetics referral has been placed.  Patient received cycle 1 of 4 of Adriamycin last week and tolerated treatment relatively well other than extreme fatigue.  If  her fatigue continues throughout week 2, will consider dose reduction of treatment.  Return to clinic in 1 week for further evaluation and consideration of cycle 2.   2.  Anxiety/insomnia: Continue Xanax as needed. 3.  Fatigue: Monitor.  Patient expressed understanding and was in agreement with this plan. She also understands that She can call clinic at any time with any questions, concerns, or complaints.   Cancer Staging Carcinoma of upper-inner quadrant of left female breast [facility]) Staging form: Breast, AJCC 8th Edition - Clinical stage from 02/01/2020: Stage IB (cT1c, cN0, cM0, G3, ER-, PR-, HER2-) - Signed by Lloyd Huger, MD on 02/01/2020   Lloyd Huger, MD   03/29/2020 10:11 AM

## 2020-03-28 ENCOUNTER — Other Ambulatory Visit: Payer: Self-pay

## 2020-03-28 ENCOUNTER — Encounter: Payer: Self-pay | Admitting: Oncology

## 2020-03-29 ENCOUNTER — Inpatient Hospital Stay (HOSPITAL_BASED_OUTPATIENT_CLINIC_OR_DEPARTMENT_OTHER): Payer: Medicaid Other | Admitting: Oncology

## 2020-03-29 ENCOUNTER — Encounter: Payer: Self-pay | Admitting: Oncology

## 2020-03-29 ENCOUNTER — Inpatient Hospital Stay: Payer: Medicaid Other

## 2020-03-29 VITALS — BP 91/68 | HR 92 | Temp 98.8°F | Resp 18 | Wt 105.3 lb

## 2020-03-29 DIAGNOSIS — C50212 Malignant neoplasm of upper-inner quadrant of left female breast: Secondary | ICD-10-CM | POA: Diagnosis not present

## 2020-03-29 DIAGNOSIS — Z171 Estrogen receptor negative status [ER-]: Secondary | ICD-10-CM | POA: Diagnosis not present

## 2020-03-29 DIAGNOSIS — Z5111 Encounter for antineoplastic chemotherapy: Secondary | ICD-10-CM | POA: Diagnosis not present

## 2020-03-29 LAB — CBC WITH DIFFERENTIAL/PLATELET
Abs Immature Granulocytes: 0 10*3/uL (ref 0.00–0.07)
Band Neutrophils: 4 %
Basophils Absolute: 0.1 10*3/uL (ref 0.0–0.1)
Basophils Relative: 2 %
Eosinophils Absolute: 0.2 10*3/uL (ref 0.0–0.5)
Eosinophils Relative: 5 %
HCT: 37.3 % (ref 36.0–46.0)
Hemoglobin: 12.5 g/dL (ref 12.0–15.0)
Lymphocytes Relative: 34 %
Lymphs Abs: 1.2 10*3/uL (ref 0.7–4.0)
MCH: 32.1 pg (ref 26.0–34.0)
MCHC: 33.5 g/dL (ref 30.0–36.0)
MCV: 95.6 fL (ref 80.0–100.0)
Monocytes Absolute: 0.1 10*3/uL (ref 0.1–1.0)
Monocytes Relative: 2 %
Neutro Abs: 1.9 10*3/uL (ref 1.7–7.7)
Neutrophils Relative %: 53 %
Platelets: 176 10*3/uL (ref 150–400)
RBC: 3.9 MIL/uL (ref 3.87–5.11)
RDW: 12.7 % (ref 11.5–15.5)
Smear Review: NORMAL
WBC: 3.4 10*3/uL — ABNORMAL LOW (ref 4.0–10.5)
nRBC: 0 % (ref 0.0–0.2)

## 2020-03-29 LAB — COMPREHENSIVE METABOLIC PANEL
ALT: 9 U/L (ref 0–44)
AST: 13 U/L — ABNORMAL LOW (ref 15–41)
Albumin: 3.7 g/dL (ref 3.5–5.0)
Alkaline Phosphatase: 125 U/L (ref 38–126)
Anion gap: 9 (ref 5–15)
BUN: 33 mg/dL — ABNORMAL HIGH (ref 6–20)
CO2: 26 mmol/L (ref 22–32)
Calcium: 9.1 mg/dL (ref 8.9–10.3)
Chloride: 102 mmol/L (ref 98–111)
Creatinine, Ser: 0.91 mg/dL (ref 0.44–1.00)
GFR calc Af Amer: >60 mL/min (ref >60)
GFR calc non Af Amer: >60 mL/min (ref >60)
Glucose, Bld: 113 mg/dL — ABNORMAL HIGH (ref 70–99)
Potassium: 4.5 mmol/L (ref 3.5–5.1)
Sodium: 137 mmol/L (ref 135–145)
Total Bilirubin: 0.8 mg/dL (ref 0.3–1.2)
Total Protein: 6.8 g/dL (ref 6.5–8.1)

## 2020-03-29 NOTE — Progress Notes (Signed)
Patient complains of pain in left breast that comes and goes. States sometimes pain is at level 8. States she had her first treatment last week and it has left her feeling still very weak and fatigued. Also has had some nausea and feels some sickness on stomach. Patient would like doctor note for work, she doesn't feel she can go to work feeling like this.

## 2020-03-30 NOTE — Progress Notes (Signed)

## 2020-04-01 NOTE — Progress Notes (Signed)
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  INTERVAL HISTORY: Patient returns to clinic today for further evaluation and consideration of cycle 2 of Adriamycin and Cytoxan.  She remains fatigued, but this has improved over the last week.  She also has some mild burning with urination.  She has no neurologic complaints.  She denies any recent fevers or illnesses.  She has a fair appetite and denies weight loss.  She has no chest pain, shortness of breath, cough, or hemoptysis.  She denies any nausea, vomiting, constipation, or diarrhea.  Patient offers no further specific complaints today.  REVIEW OF SYSTEMS:   Review of Systems  Constitutional: Positive for malaise/fatigue. Negative for fever and weight loss.  Respiratory: Negative.  Negative for cough, hemoptysis and shortness of breath.   Cardiovascular: Negative.  Negative for chest pain and leg swelling.  Gastrointestinal: Negative.  Negative for abdominal pain and nausea.  Genitourinary: Positive for dysuria.  Musculoskeletal: Negative.  Negative for back pain.  Skin: Negative.  Negative for rash.  Neurological: Negative.  Negative for dizziness, focal weakness, weakness and headaches.  Psychiatric/Behavioral: Negative.  The patient is not nervous/anxious and does not have insomnia.     As per HPI. Otherwise, a complete review of systems is negative.  PAST MEDICAL HISTORY: Past Medical History:  Diagnosis Date  . Arthritis   . Cellulitis   . COPD (chronic obstructive pulmonary disease) ([place])    . Depression   . History of kidney stones   . History of tracheostomy 2000   stated that epiglottis swelled up for an unknown reason; had trach for about a week.   . Hypertension   . Pneumonia     PAST SURGICAL HISTORY: Past Surgical History:  Procedure Laterality Date  . arm surgery  1984   fractured ulna  . BREAST LUMPECTOMY WITH SENTINEL LYMPH NODE BIOPSY Left 02/04/2020   Procedure: BREAST LUMPECTOMY WITH SENTINEL LYMPH NODE BX;  Surgeon: Jules Husbands, MD;  Location: [facility] ORS;  Service: General;  Laterality: Left;  . CLEFT LIP REPAIR    . FRACTURE SURGERY    . kidney stones    . lymphnode neck    . PORTACATH PLACEMENT N/A 02/04/2020   Procedure: INSERTION PORT-A-CATH;  Surgeon: Jules Husbands, MD;  Location: [facility] ORS;  Service: General;  Laterality: N/A;  . RE-EXCISION OF BREAST LUMPECTOMY Left 02/24/2020   Procedure: RE-EXCISION OF Left BREAST LUMPECTOMY;  Surgeon: Jules Husbands, MD;  Location: [facility] ORS;  Service: General;  Laterality: Left;  . TYMPANOSTOMY TUBE PLACEMENT      FAMILY HISTORY: Family History  Problem Relation Age of Onset  . COPD Other   . COPD Mother   . Hypertension Mother   . COPD Father   . Breast cancer Neg Hx     ADVANCED DIRECTIVES (Y/N):  N  HEALTH MAINTENANCE: Social History   Tobacco Use  . Smoking status: Current Every Day Smoker    Packs/day: 1.00    Types: Cigarettes  . Smokeless tobacco: Never Used  Substance Use Topics  . Alcohol use: No  . Drug  use: No     Colonoscopy:  PAP:  Bone density:  Lipid panel:  Allergies  Allergen Reactions  . Sulfa Antibiotics Nausea And Vomiting    Current Outpatient Medications  Medication Sig Dispense Refill  . acetaminophen (TYLENOL) 325 MG tablet Take 650 mg by mouth every 6 (six) hours as needed for moderate pain.    Marland Kitchen albuterol (PROVENTIL HFA;VENTOLIN HFA) 108 (90 Base) MCG/ACT inhaler Inhale 2 puffs into the lungs every 6 (six) hours as needed for wheezing or shortness of  breath. 1 Inhaler 2  . ALPRAZolam (XANAX) 0.25 MG tablet Take 1 tablet (0.25 mg total) by mouth at bedtime as needed for anxiety. 30 tablet 0  . cetirizine (ZYRTEC) 10 MG tablet Take 10 mg by mouth daily.    . fluticasone (FLOVENT HFA) 110 MCG/ACT inhaler Inhale 2 puffs into the lungs daily.     Marland Kitchen lidocaine-prilocaine (EMLA) cream Apply to affected area once 30 g 3  . lisinopril-hydrochlorothiazide (ZESTORETIC) 20-12.5 MG tablet Take 1 tablet by mouth daily.    . prochlorperazine (COMPAZINE) 10 MG tablet Take 1 tablet (10 mg total) by mouth every 6 (six) hours as needed (Nausea or vomiting). 60 tablet 2   No current facility-administered medications for this visit.    OBJECTIVE: Vitals:   04/06/20 0924  BP: 93/67  Pulse: 79  Resp: 20  Temp: (!) 97.1 F (36.2 C)  SpO2: 99%     Body mass index is 17.19 kg/m.    ECOG FS:0 - Asymptomatic  General: Well-developed, well-nourished, no acute distress. Eyes: Pink conjunctiva, anicteric sclera. HEENT: Normocephalic, moist mucous membranes. Lungs: No audible wheezing or coughing. Heart: Regular rate and rhythm. Abdomen: Soft, nontender, no obvious distention. Musculoskeletal: No edema, cyanosis, or clubbing. Neuro: Alert, answering all questions appropriately. Cranial nerves grossly intact. Skin: No rashes or petechiae noted. Psych: Normal affect.   LAB RESULTS:  Lab Results  Component Value Date   NA 138 04/06/2020   K 4.4 04/06/2020   CL 105 04/06/2020   CO2 24 04/06/2020   GLUCOSE 103 (H) 04/06/2020   BUN 21 (H) 04/06/2020   CREATININE 0.79 04/06/2020   CALCIUM 9.0 04/06/2020   PROT 7.4 04/06/2020   ALBUMIN 3.9 04/06/2020   AST 32 04/06/2020   ALT 13 04/06/2020   ALKPHOS 131 (H) 04/06/2020   BILITOT 0.2 (L) 04/06/2020   GFRNONAA >60 04/06/2020   GFRAA >60 04/06/2020    Lab Results  Component Value Date   WBC 20.6 (H) 04/06/2020   NEUTROABS 14.4 (H) 04/06/2020   HGB 12.3 04/06/2020   HCT 36.9 04/06/2020   MCV  94.9 04/06/2020   PLT 411 (H) 04/06/2020     STUDIES: NM Cardiac Muga Rest  Result Date: 03/22/2020 CLINICAL DATA:  Breast cancer, pre chemotherapy evaluation EXAM: NUCLEAR MEDICINE CARDIAC BLOOD POOL IMAGING (MUGA) TECHNIQUE: Cardiac multi-gated acquisition was performed at rest following intravenous injection of Tc-61mlabeled red blood cells. RADIOPHARMACEUTICALS:  22.08 mCi Tc-94mertechnetate in-vitro labeled red blood cells IV COMPARISON:  None FINDINGS: Calculated LEFT ventricular ejection fraction is 72%, normal. Study was obtained at a cardiac rate of 66 bpm. Patient was fairly rhythmic during imaging. Cine analysis of the LEFT ventricle in 3 projections demonstrates normal LV wall motion. IMPRESSION: Normal LEFT ventricular ejection fraction of 72%. Normal LV wall motion. Electronically Signed   By: MaLavonia Dana.D.   On: 03/22/2020 07:31    ASSESSMENT: Pathologic stage IB triple negative invasive carcinoma of the upper inner quadrant of  left breast.  PLAN:    1.  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  Because of positive margins she underwent reexcision on February 24, 2020 with clear margins.  She has now had a port placed as well.  MUGA scan from March 22, 2020 reported an EF of 72%. Given the triple negative status of her disease, she will then receive adjuvant chemotherapy using Adriamycin and Cytoxan with Udenyca support followed by weekly Taxol x12.  Patient will also require adjuvant XRT at the conclusion of her chemotherapy.  An aromatase inhibitor would not offer any benefit given the triple negative status of her disease.  Genetics referral has been placed.  Proceed with cycle 2 of 4 of Adriamycin and Cytoxan today.  Return to clinic tomorrow for [place] and then in 2 weeks for further evaluation and consideration of cycle 3. 2.  Anxiety/insomnia: Continue Xanax 0.5  mg at night as needed. 3.  Fatigue: Monitor. 4.  Dysuria: UA does not reveal infection.  Urine cultures pending at time of dictation. 5.  Leukocytosis: Likely secondary to Udenyca.  Monitor.  Patient expressed understanding and was in agreement with this plan. She also understands that She can call clinic at any time with any questions, concerns, or complaints.   Cancer Staging Carcinoma of upper-inner quadrant of left female breast [facility]) Staging form: Breast, AJCC 8th Edition - Clinical stage from 02/01/2020: Stage IB (cT1c, cN0, cM0, G3, ER-, PR-, HER2-) - Signed by Lloyd Huger, MD on 02/01/2020   Lloyd Huger, MD   04/06/2020 6:26 PM

## 2020-04-05 ENCOUNTER — Encounter: Payer: Self-pay | Admitting: Oncology

## 2020-04-05 ENCOUNTER — Other Ambulatory Visit: Payer: Self-pay

## 2020-04-05 DIAGNOSIS — R309 Painful micturition, unspecified: Secondary | ICD-10-CM

## 2020-04-05 NOTE — Progress Notes (Signed)
Patient called for pre assessment. States she is having some burning, tenderness, and itching when urinating. Duration of 3 - 4 days. Reports no pain or other concerns at this time.

## 2020-04-06 ENCOUNTER — Inpatient Hospital Stay: Payer: Medicaid Other

## 2020-04-06 ENCOUNTER — Encounter: Payer: Self-pay | Admitting: Oncology

## 2020-04-06 ENCOUNTER — Other Ambulatory Visit: Payer: Self-pay

## 2020-04-06 ENCOUNTER — Inpatient Hospital Stay (HOSPITAL_BASED_OUTPATIENT_CLINIC_OR_DEPARTMENT_OTHER): Payer: Medicaid Other | Admitting: Oncology

## 2020-04-06 VITALS — BP 93/67 | HR 79 | Temp 97.1°F | Resp 20 | Wt 104.9 lb

## 2020-04-06 DIAGNOSIS — C50212 Malignant neoplasm of upper-inner quadrant of left female breast: Secondary | ICD-10-CM | POA: Diagnosis not present

## 2020-04-06 DIAGNOSIS — Z171 Estrogen receptor negative status [ER-]: Secondary | ICD-10-CM

## 2020-04-06 DIAGNOSIS — R309 Painful micturition, unspecified: Secondary | ICD-10-CM

## 2020-04-06 DIAGNOSIS — Z5111 Encounter for antineoplastic chemotherapy: Secondary | ICD-10-CM | POA: Diagnosis not present

## 2020-04-06 LAB — URINALYSIS, COMPLETE (UACMP) WITH MICROSCOPIC
Bilirubin Urine: NEGATIVE
Glucose, UA: NEGATIVE mg/dL
Ketones, ur: NEGATIVE mg/dL
Leukocytes,Ua: NEGATIVE
Nitrite: NEGATIVE
Protein, ur: NEGATIVE mg/dL
Specific Gravity, Urine: 1.02 (ref 1.005–1.030)
pH: 5.5 (ref 5.0–8.0)

## 2020-04-06 LAB — CBC WITH DIFFERENTIAL/PLATELET
Abs Immature Granulocytes: 1.93 10*3/uL — ABNORMAL HIGH (ref 0.00–0.07)
Basophils Absolute: 0.1 10*3/uL (ref 0.0–0.1)
Basophils Relative: 1 %
Eosinophils Absolute: 0.1 10*3/uL (ref 0.0–0.5)
Eosinophils Relative: 0 %
HCT: 36.9 % (ref 36.0–46.0)
Hemoglobin: 12.3 g/dL (ref 12.0–15.0)
Immature Granulocytes: 9 %
Lymphocytes Relative: 14 %
Lymphs Abs: 2.9 10*3/uL (ref 0.7–4.0)
MCH: 31.6 pg (ref 26.0–34.0)
MCHC: 33.3 g/dL (ref 30.0–36.0)
MCV: 94.9 fL (ref 80.0–100.0)
Monocytes Absolute: 1.3 10*3/uL — ABNORMAL HIGH (ref 0.1–1.0)
Monocytes Relative: 6 %
Neutro Abs: 14.4 10*3/uL — ABNORMAL HIGH (ref 1.7–7.7)
Neutrophils Relative %: 70 %
Platelets: 411 10*3/uL — ABNORMAL HIGH (ref 150–400)
RBC: 3.89 MIL/uL (ref 3.87–5.11)
RDW: 12.6 % (ref 11.5–15.5)
WBC: 20.6 10*3/uL — ABNORMAL HIGH (ref 4.0–10.5)
nRBC: 0.1 % (ref 0.0–0.2)

## 2020-04-06 LAB — COMPREHENSIVE METABOLIC PANEL
ALT: 13 U/L (ref 0–44)
AST: 32 U/L (ref 15–41)
Albumin: 3.9 g/dL (ref 3.5–5.0)
Alkaline Phosphatase: 131 U/L — ABNORMAL HIGH (ref 38–126)
Anion gap: 9 (ref 5–15)
BUN: 21 mg/dL — ABNORMAL HIGH (ref 6–20)
CO2: 24 mmol/L (ref 22–32)
Calcium: 9 mg/dL (ref 8.9–10.3)
Chloride: 105 mmol/L (ref 98–111)
Creatinine, Ser: 0.79 mg/dL (ref 0.44–1.00)
GFR calc Af Amer: >60 mL/min (ref >60)
GFR calc non Af Amer: >60 mL/min (ref >60)
Glucose, Bld: 103 mg/dL — ABNORMAL HIGH (ref 70–99)
Potassium: 4.4 mmol/L (ref 3.5–5.1)
Sodium: 138 mmol/L (ref 135–145)
Total Bilirubin: 0.2 mg/dL — ABNORMAL LOW (ref 0.3–1.2)
Total Protein: 7.4 g/dL (ref 6.5–8.1)

## 2020-04-06 MED ORDER — SODIUM CHLORIDE 0.9 % IV SOLN
Freq: Once | INTRAVENOUS | Status: AC
  Filled 2020-04-06: qty 250

## 2020-04-06 MED ORDER — HEPARIN SOD (PORK) LOCK FLUSH 100 UNIT/ML IV SOLN
500.0000 [IU] | Freq: Once | INTRAVENOUS | Status: AC | PRN
  Administered 2020-04-06: 500 [IU]
  Filled 2020-04-06: qty 5

## 2020-04-06 MED ORDER — SODIUM CHLORIDE 0.9 % IV SOLN
150.0000 mg | Freq: Once | INTRAVENOUS | Status: AC
  Administered 2020-04-06: 150 mg via INTRAVENOUS
  Filled 2020-04-06: qty 150

## 2020-04-06 MED ORDER — HEPARIN SOD (PORK) LOCK FLUSH 100 UNIT/ML IV SOLN
INTRAVENOUS | Status: AC
  Filled 2020-04-06: qty 5

## 2020-04-06 MED ORDER — PALONOSETRON HCL INJECTION 0.25 MG/5ML
0.2500 mg | Freq: Once | INTRAVENOUS | Status: AC
  Administered 2020-04-06: 0.25 mg via INTRAVENOUS
  Filled 2020-04-06: qty 5

## 2020-04-06 MED ORDER — SODIUM CHLORIDE 0.9 % IV SOLN
600.0000 mg/m2 | Freq: Once | INTRAVENOUS | Status: AC
  Administered 2020-04-06: 880 mg via INTRAVENOUS
  Filled 2020-04-06: qty 44

## 2020-04-06 MED ORDER — SODIUM CHLORIDE 0.9 % IV SOLN
10.0000 mg | Freq: Once | INTRAVENOUS | Status: AC
  Administered 2020-04-06: 10 mg via INTRAVENOUS
  Filled 2020-04-06: qty 10

## 2020-04-06 MED ORDER — DOXORUBICIN HCL CHEMO IV INJECTION 2 MG/ML
60.0000 mg/m2 | Freq: Once | INTRAVENOUS | Status: AC
  Administered 2020-04-06: 88 mg via INTRAVENOUS
  Filled 2020-04-06: qty 44

## 2020-04-06 MED ORDER — SODIUM CHLORIDE 0.9% FLUSH
10.0000 mL | INTRAVENOUS | Status: DC | PRN
  Administered 2020-04-06: 10 mL via INTRAVENOUS
  Filled 2020-04-06: qty 10

## 2020-04-06 MED ORDER — HEPARIN SOD (PORK) LOCK FLUSH 100 UNIT/ML IV SOLN
500.0000 [IU] | Freq: Once | INTRAVENOUS | Status: DC
  Filled 2020-04-06: qty 5

## 2020-04-07 ENCOUNTER — Inpatient Hospital Stay: Payer: Medicaid Other

## 2020-04-07 DIAGNOSIS — C50212 Malignant neoplasm of upper-inner quadrant of left female breast: Secondary | ICD-10-CM

## 2020-04-07 DIAGNOSIS — Z5111 Encounter for antineoplastic chemotherapy: Secondary | ICD-10-CM | POA: Diagnosis not present

## 2020-04-07 LAB — URINE CULTURE: Culture: <10000 — AB

## 2020-04-07 MED ORDER — PEGFILGRASTIM-JMDB 6 MG/0.6ML ~~LOC~~ SOSY
6.0000 mg | PREFILLED_SYRINGE | Freq: Once | SUBCUTANEOUS | Status: AC
  Administered 2020-04-07: 6 mg via SUBCUTANEOUS
  Filled 2020-04-07: qty 0.6

## 2020-04-13 NOTE — Progress Notes (Signed)
Pharmacist Chemotherapy Monitoring - Follow Up Assessment    I verify that I have reviewed each item in the below checklist:  . Regimen for the patient is scheduled for the appropriate day and plan matches scheduled date. Marland Kitchen Appropriate non-routine labs are ordered dependent on drug ordered. . If applicable, additional medications reviewed and ordered per protocol based on lifetime cumulative doses and/or treatment regimen.   Plan for follow-up and/or issues identified: No . I-vent associated with next due treatment: No . MD and/or nursing notified: No  Deanna Ramsey K 04/13/2020 8:18 AM

## 2020-04-14 NOTE — Progress Notes (Signed)
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  INTERVAL HISTORY: Patient returns to clinic today for further evaluation and consideration of cycle 3 of Adriamycin and Cytoxan.  She has increased headache, but otherwise feels well.  Her weakness and fatigue have improved.  She has no other neurologic complaints.  She denies any recent fevers or illnesses.  She has a fair appetite and denies weight loss.  She has no chest pain, shortness of breath, cough, or hemoptysis.  She denies any nausea, vomiting, constipation, or diarrhea.  She has no urinary complaints.  Patient otherwise feels well and offers no further specific complaints today.  REVIEW OF SYSTEMS:   Review of Systems  Constitutional: Positive for malaise/fatigue. Negative for fever and weight loss.  Respiratory: Negative.  Negative for cough, hemoptysis and shortness of breath.   Cardiovascular: Negative.  Negative for chest pain and leg swelling.  Gastrointestinal: Negative.  Negative for abdominal pain and nausea.  Genitourinary: Negative.  Negative for dysuria.  Musculoskeletal: Negative.  Negative for back pain.  Skin: Negative.  Negative for rash.  Neurological: Positive for weakness and headaches. Negative for dizziness and focal weakness.  Psychiatric/Behavioral: Negative.  The patient is not nervous/anxious and does not have insomnia.     As per HPI. Otherwise, a complete review of systems is negative.  PAST MEDICAL HISTORY: Past Medical History:  Diagnosis Date  . Arthritis   .  Cellulitis   . COPD (chronic obstructive pulmonary disease) ([place])   . Depression   . History of kidney stones   . History of tracheostomy 2000   stated that epiglottis swelled up for an unknown reason; had trach for about a week.   . Hypertension   . Pneumonia     PAST SURGICAL HISTORY: Past Surgical History:  Procedure Laterality Date  . arm surgery  1984   fractured ulna  . BREAST LUMPECTOMY WITH SENTINEL LYMPH NODE BIOPSY Left 02/04/2020   Procedure: BREAST LUMPECTOMY WITH SENTINEL LYMPH NODE BX;  Surgeon: Jules Husbands, MD;  Location: [facility] ORS;  Service: General;  Laterality: Left;  . CLEFT LIP REPAIR    . FRACTURE SURGERY    . kidney stones    . lymphnode neck    . PORTACATH PLACEMENT N/A 02/04/2020   Procedure: INSERTION PORT-A-CATH;  Surgeon: Jules Husbands, MD;  Location: [facility] ORS;  Service: General;  Laterality: N/A;  . RE-EXCISION OF BREAST LUMPECTOMY Left 02/24/2020   Procedure: RE-EXCISION OF Left BREAST LUMPECTOMY;  Surgeon: Jules Husbands, MD;  Location: [facility] ORS;  Service: General;  Laterality: Left;  . TYMPANOSTOMY TUBE PLACEMENT      FAMILY HISTORY: Family History  Problem Relation Age of Onset  . COPD Other   . COPD Mother   . Hypertension Mother   . COPD Father   . Breast cancer Neg Hx     ADVANCED DIRECTIVES (Y/N):  N  HEALTH MAINTENANCE: Social History   Tobacco Use  . Smoking status: Current Every Day Smoker    Packs/day: 1.00    Types: Cigarettes  . Smokeless tobacco: Never Used  Substance Use  Topics  . Alcohol use: No  . Drug use: No     Colonoscopy:  PAP:  Bone density:  Lipid panel:  Allergies  Allergen Reactions  . Sulfa Antibiotics Nausea And Vomiting    Current Outpatient Medications  Medication Sig Dispense Refill  . acetaminophen (TYLENOL) 325 MG tablet Take 650 mg by mouth every 6 (six) hours as needed for moderate pain.    Marland Kitchen albuterol (PROVENTIL HFA;VENTOLIN HFA) 108 (90 Base) MCG/ACT inhaler Inhale 2 puffs into the  lungs every 6 (six) hours as needed for wheezing or shortness of breath. 1 Inhaler 2  . ALPRAZolam (XANAX) 0.25 MG tablet Take 1 tablet (0.25 mg total) by mouth at bedtime as needed for anxiety. 30 tablet 0  . cetirizine (ZYRTEC) 10 MG tablet Take 10 mg by mouth daily.    . fluticasone (FLOVENT HFA) 110 MCG/ACT inhaler Inhale 2 puffs into the lungs daily.     Marland Kitchen lidocaine-prilocaine (EMLA) cream Apply to affected area once 30 g 3  . lisinopril-hydrochlorothiazide (ZESTORETIC) 20-12.5 MG tablet Take 1 tablet by mouth daily.    . prochlorperazine (COMPAZINE) 10 MG tablet Take 1 tablet (10 mg total) by mouth every 6 (six) hours as needed (Nausea or vomiting). 60 tablet 2   No current facility-administered medications for this visit.    OBJECTIVE: Vitals:   04/20/20 0930  BP: 100/69  Pulse: 84  Resp: 20  Temp: (!) 95.5 F (35.3 C)  SpO2: 100%     Body mass index is 17.19 kg/m.    ECOG FS:0 - Asymptomatic  General: Well-developed, well-nourished, no acute distress. Eyes: Pink conjunctiva, anicteric sclera. HEENT: Normocephalic, moist mucous membranes. Lungs: No audible wheezing or coughing. Heart: Regular rate and rhythm. Abdomen: Soft, nontender, no obvious distention. Musculoskeletal: No edema, cyanosis, or clubbing. Neuro: Alert, answering all questions appropriately. Cranial nerves grossly intact. Skin: No rashes or petechiae noted. Psych: Normal affect.   LAB RESULTS:  Lab Results  Component Value Date   NA 137 04/20/2020   K 4.5 04/20/2020   CL 105 04/20/2020   CO2 24 04/20/2020   GLUCOSE 90 04/20/2020   BUN 30 (H) 04/20/2020   CREATININE 0.85 04/20/2020   CALCIUM 8.7 (L) 04/20/2020   PROT 6.8 04/20/2020   ALBUMIN 3.6 04/20/2020   AST 15 04/20/2020   ALT 10 04/20/2020   ALKPHOS 137 (H) 04/20/2020   BILITOT 0.3 04/20/2020   GFRNONAA >60 04/20/2020   GFRAA >60 04/20/2020    Lab Results  Component Value Date   WBC 26.0 (H) 04/20/2020   NEUTROABS 18.0 (H)  04/20/2020   HGB 11.0 (L) 04/20/2020   HCT 33.0 (L) 04/20/2020   MCV 94.6 04/20/2020   PLT 302 04/20/2020     STUDIES: No results found.  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  PLAN:    1.  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  Because of positive margins she underwent reexcision on February 24, 2020 with clear margins.  She has now had a port placed as well.  MUGA scan from March 22, 2020 reported an EF of 72%. Given the triple negative status of her disease, she will receive adjuvant chemotherapy using Adriamycin and Cytoxan with Udenyca support followed by weekly Taxol x12.  Patient will also require adjuvant XRT at the conclusion of her chemotherapy.  An aromatase inhibitor  would not offer any benefit given the triple negative status of her disease.  Genetics referral has been placed.  Proceed with cycle 3 of 4 of Adriamycin and Cytoxan today.  Given her leukocytosis, will hold Udenyca tomorrow.  Return to clinic in 2 weeks for further evaluation and consideration of cycle 4.   2.  Anxiety/insomnia: Continue Xanax 0.5 mg at night as needed. 3.  Fatigue: Monitor. 4.  Leukocytosis: No obvious evidence of infection.  Possibly secondary to Udenyca: Hold injection tomorrow. 5.  Headache: Continue Tylenol and ibuprofen sparingly.    Patient expressed understanding and was in agreement with this plan. She also understands that She can call clinic at any time with any questions, concerns, or complaints.   Cancer Staging Carcinoma of upper-inner quadrant of left female breast [facility]) Staging form: Breast, AJCC 8th Edition - Clinical stage from 02/01/2020: Stage IB (cT1c, cN0, cM0, G3, ER-, PR-, HER2-) - Signed by Lloyd Huger, MD on 02/01/2020   Lloyd Huger, MD   04/21/2020 6:50 AM

## 2020-04-17 ENCOUNTER — Other Ambulatory Visit: Payer: Self-pay | Admitting: Emergency Medicine

## 2020-04-17 DIAGNOSIS — C50212 Malignant neoplasm of upper-inner quadrant of left female breast: Secondary | ICD-10-CM

## 2020-04-17 MED ORDER — ALPRAZOLAM 0.25 MG PO TABS: 0.2500 mg | ORAL_TABLET | Freq: Every evening | ORAL | 0 refills | Status: DC | PRN

## 2020-04-19 ENCOUNTER — Other Ambulatory Visit: Payer: Self-pay

## 2020-04-19 ENCOUNTER — Encounter: Payer: Self-pay | Admitting: Oncology

## 2020-04-19 NOTE — Progress Notes (Signed)
Patient prescreened for appointment. Patient has no concerns or questions.  

## 2020-04-20 ENCOUNTER — Inpatient Hospital Stay: Payer: Medicaid Other | Attending: Oncology

## 2020-04-20 ENCOUNTER — Encounter: Payer: Self-pay | Admitting: Oncology

## 2020-04-20 ENCOUNTER — Encounter: Payer: Self-pay | Admitting: *Deleted

## 2020-04-20 ENCOUNTER — Inpatient Hospital Stay: Payer: Medicaid Other

## 2020-04-20 ENCOUNTER — Inpatient Hospital Stay (HOSPITAL_BASED_OUTPATIENT_CLINIC_OR_DEPARTMENT_OTHER): Payer: Medicaid Other | Admitting: Oncology

## 2020-04-20 VITALS — BP 100/69 | HR 84 | Temp 95.5°F | Resp 20 | Wt 104.9 lb

## 2020-04-20 DIAGNOSIS — D72819 Decreased white blood cell count, unspecified: Secondary | ICD-10-CM | POA: Diagnosis not present

## 2020-04-20 DIAGNOSIS — Z5111 Encounter for antineoplastic chemotherapy: Secondary | ICD-10-CM | POA: Insufficient documentation

## 2020-04-20 DIAGNOSIS — C50212 Malignant neoplasm of upper-inner quadrant of left female breast: Secondary | ICD-10-CM | POA: Diagnosis present

## 2020-04-20 DIAGNOSIS — Z171 Estrogen receptor negative status [ER-]: Secondary | ICD-10-CM | POA: Insufficient documentation

## 2020-04-20 DIAGNOSIS — Z95828 Presence of other vascular implants and grafts: Secondary | ICD-10-CM

## 2020-04-20 DIAGNOSIS — Z5189 Encounter for other specified aftercare: Secondary | ICD-10-CM | POA: Insufficient documentation

## 2020-04-20 LAB — COMPREHENSIVE METABOLIC PANEL
ALT: 10 U/L (ref 0–44)
AST: 15 U/L (ref 15–41)
Albumin: 3.6 g/dL (ref 3.5–5.0)
Alkaline Phosphatase: 137 U/L — ABNORMAL HIGH (ref 38–126)
Anion gap: 8 (ref 5–15)
BUN: 30 mg/dL — ABNORMAL HIGH (ref 6–20)
CO2: 24 mmol/L (ref 22–32)
Calcium: 8.7 mg/dL — ABNORMAL LOW (ref 8.9–10.3)
Chloride: 105 mmol/L (ref 98–111)
Creatinine, Ser: 0.85 mg/dL (ref 0.44–1.00)
GFR calc Af Amer: >60 mL/min (ref >60)
GFR calc non Af Amer: >60 mL/min (ref >60)
Glucose, Bld: 90 mg/dL (ref 70–99)
Potassium: 4.5 mmol/L (ref 3.5–5.1)
Sodium: 137 mmol/L (ref 135–145)
Total Bilirubin: 0.3 mg/dL (ref 0.3–1.2)
Total Protein: 6.8 g/dL (ref 6.5–8.1)

## 2020-04-20 LAB — CBC WITH DIFFERENTIAL/PLATELET
Abs Immature Granulocytes: 3.36 10*3/uL — ABNORMAL HIGH (ref 0.00–0.07)
Basophils Absolute: 0.1 10*3/uL (ref 0.0–0.1)
Basophils Relative: 0 %
Eosinophils Absolute: 0.1 10*3/uL (ref 0.0–0.5)
Eosinophils Relative: 0 %
HCT: 33 % — ABNORMAL LOW (ref 36.0–46.0)
Hemoglobin: 11 g/dL — ABNORMAL LOW (ref 12.0–15.0)
Immature Granulocytes: 13 %
Lymphocytes Relative: 11 %
Lymphs Abs: 2.9 10*3/uL (ref 0.7–4.0)
MCH: 31.5 pg (ref 26.0–34.0)
MCHC: 33.3 g/dL (ref 30.0–36.0)
MCV: 94.6 fL (ref 80.0–100.0)
Monocytes Absolute: 1.5 10*3/uL — ABNORMAL HIGH (ref 0.1–1.0)
Monocytes Relative: 6 %
Neutro Abs: 18 10*3/uL — ABNORMAL HIGH (ref 1.7–7.7)
Neutrophils Relative %: 70 %
Platelets: 302 10*3/uL (ref 150–400)
RBC: 3.49 MIL/uL — ABNORMAL LOW (ref 3.87–5.11)
RDW: 13 % (ref 11.5–15.5)
WBC: 26 10*3/uL — ABNORMAL HIGH (ref 4.0–10.5)
nRBC: 0.1 % (ref 0.0–0.2)

## 2020-04-20 MED ORDER — SODIUM CHLORIDE 0.9 % IV SOLN
Freq: Once | INTRAVENOUS | Status: AC
  Filled 2020-04-20: qty 250

## 2020-04-20 MED ORDER — PALONOSETRON HCL INJECTION 0.25 MG/5ML
0.2500 mg | Freq: Once | INTRAVENOUS | Status: AC
  Administered 2020-04-20: 0.25 mg via INTRAVENOUS
  Filled 2020-04-20: qty 5

## 2020-04-20 MED ORDER — SODIUM CHLORIDE 0.9 % IV SOLN
150.0000 mg | Freq: Once | INTRAVENOUS | Status: AC
  Administered 2020-04-20: 150 mg via INTRAVENOUS
  Filled 2020-04-20: qty 5

## 2020-04-20 MED ORDER — SODIUM CHLORIDE 0.9 % IV SOLN
600.0000 mg/m2 | Freq: Once | INTRAVENOUS | Status: AC
  Administered 2020-04-20: 880 mg via INTRAVENOUS
  Filled 2020-04-20: qty 44

## 2020-04-20 MED ORDER — HEPARIN SOD (PORK) LOCK FLUSH 100 UNIT/ML IV SOLN
500.0000 [IU] | Freq: Once | INTRAVENOUS | Status: AC | PRN
  Administered 2020-04-20: 500 [IU]
  Filled 2020-04-20: qty 5

## 2020-04-20 MED ORDER — SODIUM CHLORIDE 0.9% FLUSH
10.0000 mL | INTRAVENOUS | Status: DC | PRN
  Administered 2020-04-20: 10 mL via INTRAVENOUS
  Filled 2020-04-20: qty 10

## 2020-04-20 MED ORDER — SODIUM CHLORIDE 0.9 % IV SOLN
10.0000 mg | Freq: Once | INTRAVENOUS | Status: AC
  Administered 2020-04-20: 10 mg via INTRAVENOUS
  Filled 2020-04-20: qty 10

## 2020-04-20 MED ORDER — DOXORUBICIN HCL CHEMO IV INJECTION 2 MG/ML
60.0000 mg/m2 | Freq: Once | INTRAVENOUS | Status: AC
  Administered 2020-04-20: 88 mg via INTRAVENOUS
  Filled 2020-04-20: qty 25

## 2020-04-20 NOTE — Progress Notes (Signed)
Patient here today for follow up. Reports she has been having headaches for about a week now. States pain is at 8. Would like to discuss with provider and see if she could get medication. Also states she is having some cramping in her feet that just started. Patient reports she has been eating better, staying hydrated and has more energy.

## 2020-04-20 NOTE — Progress Notes (Signed)
Blood return noted before, every 3cc during and after Adriamycin push.   

## 2020-04-20 NOTE — Research (Signed)
Met with patient Deanna Ramsey in clinic this morning at Dr. Gary Fleet request to discuss the SWOG G4010 clinical trial, which she will be eligible for when she begins her Taxol therapy in 4 weeks. Reviewed purpose of the trial including the tools that we would use to monitor her peripheral neuropathy on the trial. Discussed trial time points, assessments that woiuld occur at each time point, description of neuropen and tuning fork, mandatory questionnaires and labs that would be collected along with potential risks and benefits of study participation. Informed that she would not be paid to participate in the study, that participation is voluntary and that she could withdraw at any time without consequence. Also reviewed that study participation would not change her treatment at all or cost her anything to participate, and that we would complete all study assessments at a scheduled appointment to avoid extra clinic visits. A copy of the ICF and HIPAA forms for the S1714 study along with contact information for myself and Jeral Fruit, RN was given to patient to take home and review. Plans made to touch base with patient in 2 weeks for her decision whether or not to participate. In the meantime, she has been instructed to contact myself or Jeral Fruit if she has any study-related questions. Yolande Jolly, BSN, MHA, OCN 04/20/2020 9:39 AM   Spoke with patient Deanna Ramsey this afternoon to determine her interest in the SWOG S1714 study. Patient states she does not want to do the trial right now. Admits she may change her mind, but was informed that she can only change her mind about study participation before she begins receiving Taxol in 2 weeks. Patient states she will call me if she does change her mind. Will offer DCP-001 at next clinic visit in 2 weeks. Yolande Jolly, BSN, MHA, OCN 05/05/2020 12:38 PM

## 2020-04-21 ENCOUNTER — Inpatient Hospital Stay: Payer: Medicaid Other

## 2020-04-29 NOTE — Progress Notes (Signed)
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  INTERVAL HISTORY: Patient returns to clinic today for further evaluation and consideration of cycle 4 of Adriamycin and Cytoxan.  She has slight congestion and cough, but denies fever.  She otherwise feels well and is asymptomatic.  She has no neurologic complaints.  She has a fair appetite and denies weight loss.  She has no chest pain, shortness of breath, cough, or hemoptysis.  She denies any nausea, vomiting, constipation, or diarrhea.  She has no urinary complaints.  Patient offers no further specific complaints today.  REVIEW OF SYSTEMS:   Review of Systems  Constitutional: Negative.  Negative for fever, malaise/fatigue and weight loss.  HENT: Positive for congestion.   Respiratory: Negative.  Negative for cough, hemoptysis and shortness of breath.   Cardiovascular: Negative.  Negative for chest pain and leg swelling.  Gastrointestinal: Negative.  Negative for abdominal pain and nausea.  Genitourinary: Negative.  Negative for dysuria.  Musculoskeletal: Negative.  Negative for back pain.  Skin: Negative.  Negative for rash.  Neurological: Negative.  Negative for dizziness, focal weakness, weakness and headaches.  Psychiatric/Behavioral: Negative.  The patient is not nervous/anxious and does not have insomnia.     As per HPI. Otherwise, a complete review of systems is negative.  PAST MEDICAL HISTORY: Past Medical History:  Diagnosis Date  . Arthritis   . Cellulitis   . COPD (chronic obstructive  pulmonary disease) ([place])   . Depression   . History of kidney stones   . History of tracheostomy 2000   stated that epiglottis swelled up for an unknown reason; had trach for about a week.   . Hypertension   . Pneumonia     PAST SURGICAL HISTORY: Past Surgical History:  Procedure Laterality Date  . arm surgery  1984   fractured ulna  . BREAST LUMPECTOMY WITH SENTINEL LYMPH NODE BIOPSY Left 02/04/2020   Procedure: BREAST LUMPECTOMY WITH SENTINEL LYMPH NODE BX;  Surgeon: Jules Husbands, MD;  Location: [facility] ORS;  Service: General;  Laterality: Left;  . CLEFT LIP REPAIR    . FRACTURE SURGERY    . kidney stones    . lymphnode neck    . PORTACATH PLACEMENT N/A 02/04/2020   Procedure: INSERTION PORT-A-CATH;  Surgeon: Jules Husbands, MD;  Location: [facility] OR[facility];  Service: General;  Laterality: N/A;  . RE-EXCISION OF BREAST LUMPECTOMY Left 02/24/2020   Procedure: RE-EXCISION OF Left BREAST LUMPECTOMY;  Surgeon: Jules Husbands, MD;  Location: [facility] ORS;  Service: General;  Laterality: Left;  . TYMPANOSTOMY TUBE PLACEMENT      FAMILY HISTORY: Family History  Problem Relation Age of Onset  . COPD Other   . COPD Mother   . Hypertension Mother   . COPD Father   . Breast cancer Neg Hx     ADVANCED DIRECTIVES (Y/N):  N  HEALTH MAINTENANCE: Social History   Tobacco Use  . Smoking status: Current Every Day Smoker    Packs/day: 1.00    Types: Cigarettes  . Smokeless tobacco: Never Used  Substance Use Topics  . Alcohol use: No  .  Drug use: No     Colonoscopy:  PAP:  Bone density:  Lipid panel:  Allergies  Allergen Reactions  . Sulfa Antibiotics Nausea And Vomiting    Current Outpatient Medications  Medication Sig Dispense Refill  . acetaminophen (TYLENOL) 325 MG tablet Take 650 mg by mouth every 6 (six) hours as needed for moderate pain.    Marland Kitchen albuterol (PROVENTIL HFA;VENTOLIN HFA) 108 (90 Base) MCG/ACT inhaler Inhale 2 puffs into the lungs every 6 (six) hours as needed for  wheezing or shortness of breath. 1 Inhaler 2  . ALPRAZolam (XANAX) 0.25 MG tablet Take 1 tablet (0.25 mg total) by mouth at bedtime as needed for anxiety. 30 tablet 0  . cetirizine (ZYRTEC) 10 MG tablet Take 10 mg by mouth daily.    . fluticasone (FLOVENT HFA) 110 MCG/ACT inhaler Inhale 2 puffs into the lungs daily.     Marland Kitchen lidocaine-prilocaine (EMLA) cream Apply to affected area once 30 g 3  . lisinopril-hydrochlorothiazide (ZESTORETIC) 20-12.5 MG tablet Take 1 tablet by mouth daily.    . prochlorperazine (COMPAZINE) 10 MG tablet Take 1 tablet (10 mg total) by mouth every 6 (six) hours as needed (Nausea or vomiting). 60 tablet 2   No current facility-administered medications for this visit.   Facility-Administered Medications Ordered in Other Visits  Medication Dose Route Frequency Provider Last Rate Last Admin  . sodium chloride flush (NS) 0.9 % injection 10 mL  10 mL Intravenous PRN Lloyd Huger, MD   10 mL at 05/04/20 0820    OBJECTIVE: Vitals:   05/04/20 0843  BP: 109/76  Pulse: 91  Resp: 18  Temp: (!) 96.3 F (35.7 C)  SpO2: 100%     Body mass index is 16.99 kg/m.    ECOG FS:0 - Asymptomatic  General: Well-developed, well-nourished, no acute distress. Eyes: Pink conjunctiva, anicteric sclera. HEENT: Normocephalic, moist mucous membranes. Lungs: No audible wheezing or coughing. Heart: Regular rate and rhythm. Abdomen: Soft, nontender, no obvious distention. Musculoskeletal: No edema, cyanosis, or clubbing. Neuro: Alert, answering all questions appropriately. Cranial nerves grossly intact. Skin: No rashes or petechiae noted. Psych: Normal affect.   LAB RESULTS:  Lab Results  Component Value Date   NA 137 05/04/2020   K 4.1 05/04/2020   CL 104 05/04/2020   CO2 25 05/04/2020   GLUCOSE 102 (H) 05/04/2020   BUN 23 (H) 05/04/2020   CREATININE 0.84 05/04/2020   CALCIUM 8.7 (L) 05/04/2020   PROT 7.0 05/04/2020   ALBUMIN 3.8 05/04/2020   AST 15 05/04/2020   ALT  11 05/04/2020   ALKPHOS 84 05/04/2020   BILITOT 0.4 05/04/2020   GFRNONAA >60 05/04/2020   GFRAA >60 05/04/2020    Lab Results  Component Value Date   WBC 3.0 (L) 05/04/2020   NEUTROABS 1.0 (L) 05/04/2020   HGB 9.6 (L) 05/04/2020   HCT 28.2 (L) 05/04/2020   MCV 94.3 05/04/2020   PLT 327 05/04/2020     STUDIES: No results found.  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  PLAN:    1.  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  Because of positive margins she underwent reexcision on February 24, 2020 with clear margins.  She has now had a port placed as well.  MUGA scan from March 22, 2020 reported an EF of 72%. Given the triple negative status  of her disease, she will receive adjuvant chemotherapy using Adriamycin and Cytoxan with Udenyca support followed by weekly Taxol x12.  Patient will also require adjuvant XRT at the conclusion of her chemotherapy.  An aromatase inhibitor would not offer any benefit given the triple negative status of her disease.  Genetics referral has been placed.  Proceed with cycle 4 of 4 of Adriamycin and Cytoxan today.  Return to clinic tomorrow for [facility].  Patient will then return to clinic in 1 week for further evaluation and consideration of cycle 1 of 12 of weekly Taxol.      2.  Anxiety/insomnia: Continue Xanax 0.5 mg at night as needed. 3.  Fatigue: Patient does not complain of this today. 4.  Leukopenia: Proceed with treatment as above.  Udenyca tomorrow as scheduled. 5.  Congestion: Recommended symptomatic OTC treatments.  Patient expressed understanding and was in agreement with this plan. She also understands that She can call clinic at any time with any questions, concerns, or complaints.   Cancer Staging Carcinoma of upper-inner quadrant of left female breast  [facility]) Staging form: Breast, AJCC 8th Edition - Clinical stage from 02/01/2020: Stage IB (cT1c, cN0, cM0, G3, ER-, PR-, HER2-) - Signed by Lloyd Huger, MD on 02/01/2020   Lloyd Huger, MD   05/04/2020 9:14 AM

## 2020-05-03 ENCOUNTER — Encounter: Payer: Self-pay | Admitting: Oncology

## 2020-05-03 ENCOUNTER — Telehealth: Payer: Self-pay

## 2020-05-03 NOTE — Telephone Encounter (Signed)
Patient called and states she has a head cold and would like to know is she ok to go to treatment tomorrow. Symptoms include cough, stuffy nose and headache.  Informed patient I will check with provider. Routing to provider to advise.

## 2020-05-03 NOTE — Telephone Encounter (Signed)
Called patient back and informed her of provider's response. She states she doesn't feel she is running a fever but has been unable to check. Also reports sore throat.    Patient was in agreement with provider's response and will come to appointment tomorrow.

## 2020-05-03 NOTE — Progress Notes (Signed)
Patient was called for pre assessment today. States she has a very bad head cold and sore throat. Denies fever but she is unable to check temperature. States she has some tingling and numbness in feet also.

## 2020-05-03 NOTE — Telephone Encounter (Signed)
Yes, keep appt please.

## 2020-05-04 ENCOUNTER — Inpatient Hospital Stay: Payer: Medicaid Other

## 2020-05-04 ENCOUNTER — Inpatient Hospital Stay (HOSPITAL_BASED_OUTPATIENT_CLINIC_OR_DEPARTMENT_OTHER): Payer: Medicaid Other | Admitting: Oncology

## 2020-05-04 ENCOUNTER — Other Ambulatory Visit: Payer: Self-pay

## 2020-05-04 VITALS — BP 109/76 | HR 91 | Temp 96.3°F | Resp 18 | Wt 103.7 lb

## 2020-05-04 DIAGNOSIS — Z95828 Presence of other vascular implants and grafts: Secondary | ICD-10-CM

## 2020-05-04 DIAGNOSIS — C50212 Malignant neoplasm of upper-inner quadrant of left female breast: Secondary | ICD-10-CM

## 2020-05-04 DIAGNOSIS — Z5111 Encounter for antineoplastic chemotherapy: Secondary | ICD-10-CM | POA: Diagnosis not present

## 2020-05-04 DIAGNOSIS — Z171 Estrogen receptor negative status [ER-]: Secondary | ICD-10-CM

## 2020-05-04 LAB — COMPREHENSIVE METABOLIC PANEL
ALT: 11 U/L (ref 0–44)
AST: 15 U/L (ref 15–41)
Albumin: 3.8 g/dL (ref 3.5–5.0)
Alkaline Phosphatase: 84 U/L (ref 38–126)
Anion gap: 8 (ref 5–15)
BUN: 23 mg/dL — ABNORMAL HIGH (ref 6–20)
CO2: 25 mmol/L (ref 22–32)
Calcium: 8.7 mg/dL — ABNORMAL LOW (ref 8.9–10.3)
Chloride: 104 mmol/L (ref 98–111)
Creatinine, Ser: 0.84 mg/dL (ref 0.44–1.00)
GFR calc Af Amer: >60 mL/min (ref >60)
GFR calc non Af Amer: >60 mL/min (ref >60)
Glucose, Bld: 102 mg/dL — ABNORMAL HIGH (ref 70–99)
Potassium: 4.1 mmol/L (ref 3.5–5.1)
Sodium: 137 mmol/L (ref 135–145)
Total Bilirubin: 0.4 mg/dL (ref 0.3–1.2)
Total Protein: 7 g/dL (ref 6.5–8.1)

## 2020-05-04 LAB — CBC WITH DIFFERENTIAL/PLATELET
Abs Immature Granulocytes: 0.02 10*3/uL (ref 0.00–0.07)
Basophils Absolute: 0.1 10*3/uL (ref 0.0–0.1)
Basophils Relative: 3 %
Eosinophils Absolute: 0.1 10*3/uL (ref 0.0–0.5)
Eosinophils Relative: 2 %
HCT: 28.2 % — ABNORMAL LOW (ref 36.0–46.0)
Hemoglobin: 9.6 g/dL — ABNORMAL LOW (ref 12.0–15.0)
Immature Granulocytes: 1 %
Lymphocytes Relative: 43 %
Lymphs Abs: 1.3 10*3/uL (ref 0.7–4.0)
MCH: 32.1 pg (ref 26.0–34.0)
MCHC: 34 g/dL (ref 30.0–36.0)
MCV: 94.3 fL (ref 80.0–100.0)
Monocytes Absolute: 0.6 10*3/uL (ref 0.1–1.0)
Monocytes Relative: 19 %
Neutro Abs: 1 10*3/uL — ABNORMAL LOW (ref 1.7–7.7)
Neutrophils Relative %: 32 %
Platelets: 327 10*3/uL (ref 150–400)
RBC: 2.99 MIL/uL — ABNORMAL LOW (ref 3.87–5.11)
RDW: 14.3 % (ref 11.5–15.5)
WBC: 3 10*3/uL — ABNORMAL LOW (ref 4.0–10.5)
nRBC: 0 % (ref 0.0–0.2)

## 2020-05-04 MED ORDER — HEPARIN SOD (PORK) LOCK FLUSH 100 UNIT/ML IV SOLN
INTRAVENOUS | Status: AC
  Filled 2020-05-04: qty 5

## 2020-05-04 MED ORDER — SODIUM CHLORIDE 0.9% FLUSH
10.0000 mL | INTRAVENOUS | Status: DC | PRN
  Administered 2020-05-04: 10 mL via INTRAVENOUS
  Filled 2020-05-04: qty 10

## 2020-05-04 MED ORDER — SODIUM CHLORIDE 0.9 % IV SOLN
150.0000 mg | Freq: Once | INTRAVENOUS | Status: AC
  Administered 2020-05-04: 150 mg via INTRAVENOUS
  Filled 2020-05-04: qty 150

## 2020-05-04 MED ORDER — SODIUM CHLORIDE 0.9 % IV SOLN
Freq: Once | INTRAVENOUS | Status: AC
  Filled 2020-05-04: qty 250

## 2020-05-04 MED ORDER — DOXORUBICIN HCL CHEMO IV INJECTION 2 MG/ML
60.0000 mg/m2 | Freq: Once | INTRAVENOUS | Status: AC
  Administered 2020-05-04: 88 mg via INTRAVENOUS
  Filled 2020-05-04: qty 44

## 2020-05-04 MED ORDER — SODIUM CHLORIDE 0.9 % IV SOLN
600.0000 mg/m2 | Freq: Once | INTRAVENOUS | Status: AC
  Administered 2020-05-04: 880 mg via INTRAVENOUS
  Filled 2020-05-04: qty 44

## 2020-05-04 MED ORDER — SODIUM CHLORIDE 0.9 % IV SOLN
10.0000 mg | Freq: Once | INTRAVENOUS | Status: AC
  Administered 2020-05-04: 10 mg via INTRAVENOUS
  Filled 2020-05-04: qty 10

## 2020-05-04 MED ORDER — PALONOSETRON HCL INJECTION 0.25 MG/5ML
0.2500 mg | Freq: Once | INTRAVENOUS | Status: AC
  Administered 2020-05-04: 0.25 mg via INTRAVENOUS
  Filled 2020-05-04: qty 5

## 2020-05-04 MED ORDER — HEPARIN SOD (PORK) LOCK FLUSH 100 UNIT/ML IV SOLN
500.0000 [IU] | Freq: Once | INTRAVENOUS | Status: AC | PRN
  Administered 2020-05-04: 500 [IU]
  Filled 2020-05-04: qty 5

## 2020-05-04 NOTE — Progress Notes (Signed)
Blood return noted before, every 3cc during and after Adriamycin push.   

## 2020-05-05 ENCOUNTER — Telehealth: Payer: Self-pay

## 2020-05-05 ENCOUNTER — Inpatient Hospital Stay: Payer: Medicaid Other

## 2020-05-05 DIAGNOSIS — C50212 Malignant neoplasm of upper-inner quadrant of left female breast: Secondary | ICD-10-CM

## 2020-05-05 DIAGNOSIS — Z5111 Encounter for antineoplastic chemotherapy: Secondary | ICD-10-CM | POA: Diagnosis not present

## 2020-05-05 MED ORDER — PEGFILGRASTIM-JMDB 6 MG/0.6ML ~~LOC~~ SOSY
6.0000 mg | PREFILLED_SYRINGE | Freq: Once | SUBCUTANEOUS | Status: AC
  Administered 2020-05-05: 6 mg via SUBCUTANEOUS
  Filled 2020-05-05: qty 0.6

## 2020-05-06 ENCOUNTER — Other Ambulatory Visit: Payer: Self-pay | Admitting: Oncology

## 2020-05-06 MED ORDER — CIPROFLOXACIN HCL 0.3 % OP SOLN
2.0000 [drp] | OPHTHALMIC | 1 refills | Status: DC
Start: 2020-05-06 — End: 2020-10-20

## 2020-05-11 ENCOUNTER — Telehealth: Payer: Self-pay | Admitting: *Deleted

## 2020-05-11 ENCOUNTER — Ambulatory Visit: Payer: Medicaid Other

## 2020-05-11 ENCOUNTER — Telehealth (HOSPITAL_BASED_OUTPATIENT_CLINIC_OR_DEPARTMENT_OTHER): Payer: Medicaid Other | Admitting: Oncology

## 2020-05-11 ENCOUNTER — Other Ambulatory Visit: Payer: Medicaid Other

## 2020-05-11 ENCOUNTER — Inpatient Hospital Stay: Payer: Medicaid Other | Attending: Oncology | Admitting: Oncology

## 2020-05-11 ENCOUNTER — Other Ambulatory Visit: Payer: Self-pay

## 2020-05-11 ENCOUNTER — Ambulatory Visit: Payer: Medicaid Other | Admitting: Oncology

## 2020-05-11 DIAGNOSIS — Z171 Estrogen receptor negative status [ER-]: Secondary | ICD-10-CM | POA: Diagnosis not present

## 2020-05-11 DIAGNOSIS — C50212 Malignant neoplasm of upper-inner quadrant of left female breast: Secondary | ICD-10-CM | POA: Insufficient documentation

## 2020-05-11 DIAGNOSIS — Z5111 Encounter for antineoplastic chemotherapy: Secondary | ICD-10-CM | POA: Insufficient documentation

## 2020-05-11 DIAGNOSIS — J0101 Acute recurrent maxillary sinusitis: Secondary | ICD-10-CM

## 2020-05-11 MED ORDER — AMOXICILLIN-POT CLAVULANATE 875-125 MG PO TABS: 1.0000 | ORAL_TABLET | Freq: Two times a day (BID) | ORAL | 0 refills | Status: DC

## 2020-05-11 MED ORDER — MUPIROCIN 2 % EX OINT: 1.0000 "application " | TOPICAL_OINTMENT | Freq: Two times a day (BID) | CUTANEOUS | 0 refills | Status: DC

## 2020-05-11 NOTE — Progress Notes (Signed)

## 2020-05-11 NOTE — Telephone Encounter (Signed)
Patient called asking for something for a sinus infection. She does report that she also has a sore throat making it difficult to swallow as well. She reports that there is blood in her mucous when she blows her nose. Please advise

## 2020-05-11 NOTE — Progress Notes (Signed)
Virtual Visit via Telephone Note  I connected withJodi L Ramsey on 05/11/2020 at 12 pm  by telephoneand verified that I am speaking with the correct person using two identifiers.  Location: Patient: Home Provider: Clinic   I discussed the limitations, risks, security and privacy concerns of performing an evaluation and management service by telephone and the availability of in person appointments. I also discussed with the patient that there may be a patient responsible charge related to this service. The patient expressed understanding and agreed to proceed.   History of Present Illness: Deanna Ramsey is a 54 year old female with past medical history significant for arthritis, COPD, depression, hypertension with recent diagnosis of stage Ib triple negative left breast cancer.  She is currently being followed Dr. [name] and is status post 4 cycles of Adriamycin and Cytoxan.  She was seen by Dr. [name] last week on 05/04/2020 prior to cycle 4 and noted to have a slight congestion and cough.  No fever.  She was asked to start OTC medications such as Claritin and Zyrtec.  Observations/Objective: Today, patient reports a "full-blown" sinus infection.  Symptoms started approximately 1 week ago and have worsened since then.  She is finding it hard to swallow and having significant amount of nasal congestion and frequent nose blowing.  Discharge is yellow/clear with streaks of blood.  She has tried Claritin and Zyrtec along with Tylenol and ibuprofen without significant improvement.  She denies a true cough but admits to more a sore throat.  Denies fever.  She has an occasional headache thought to be due to sinus pressure.  She denies any chills.  Having normal bowel movements and denies urinary concerns.  Unrelated concern is a mouth sore located in the corner of her mouth which she has tried Neosporin ointment without resolution.  Assessment and Plan: Patient unable to be seen in clinic due to  transportation concerns.  Patient appears to have chronic sinusitis and when she was evaluated by Dr. [name] she had similar concerns.  We will go ahead and treat with Augmentin 875/125 mg tablets twice daily x7 days for total of 14 tablets.  I have also asked she continue taking Claritin/Zyrtec in the morning and Benadryl at bedtime.  We will also try Bactroban ointment she can apply to her mouth sore.  RX Augmentin x7 days.  Rx Bactroban ointment prn  Follow Up Instructions: RTC as scheduled on 05/18/2020 for labs, assessment and chemotherapy.   I discussed the assessment and treatment plan with the patient. The patient was provided an opportunity to ask questions and all were answered. The patient agreed with the plan and demonstrated an understanding of the instructions.  The patient was advised to call back or seek an in-person evaluation if the symptoms worsen or if the condition fails to improve as anticipated.  I provided 22 minutes of non-face-to-face time during this encounter.   Jacquelin Hawking, NP

## 2020-05-11 NOTE — Telephone Encounter (Signed)
Patient doesn't have a smart phone.

## 2020-05-11 NOTE — Telephone Encounter (Signed)
Can she do a virtual visit at like 12?  Faythe Casa, NP 05/11/2020 11:02 AM

## 2020-05-11 NOTE — Telephone Encounter (Signed)
Called patient to see if she could come in this afternoon for evaluation by Rulon Abide, NP in the symptom management clinic. Patient stated that she is sure she has a sinus infection, because she has had numerous ones before, and this feels exactly the same. She is asking if we can call in an antibiotic to [facility]. I told her that I would check with the provider and call her back.

## 2020-05-11 NOTE — Telephone Encounter (Signed)
Virtual Visit via Telephone Note  I connected with Deanna Ramsey on 05/11/2020 at 12 pm  by telephone and verified that I am speaking with the correct person using two identifiers.  Location: Patient: Home Provider: Clinic    I discussed the limitations, risks, security and privacy concerns of performing an evaluation and management service by telephone and the availability of in person appointments. I also discussed with the patient that there may be a patient responsible charge related to this service. The patient expressed understanding and agreed to proceed.   History of Present Illness: Deanna Ramsey is a 54 year old female with past medical history significant for arthritis, COPD, depression, hypertension with recent diagnosis of stage Ib triple negative left breast cancer.  She is currently being followed Dr. [name] and is status post 4 cycles of Adriamycin and Cytoxan.  She was seen by Dr. [name] last week on 05/04/2020 prior to cycle 4 and noted to have a slight congestion and cough.  No fever.  She was asked to start OTC medications such as Claritin and Zyrtec.  Observations/Objective: Today, patient reports a "full-blown" sinus infection.  Symptoms started approximately 1 week ago and have worsened since then.  She is finding it hard to swallow and having significant amount of nasal congestion and frequent nose blowing.  Discharge is yellow/clear with streaks of blood.  She has tried Claritin and Zyrtec along with Tylenol and ibuprofen without significant improvement.  She denies a true cough but admits to more a sore throat.  Denies fever.  She has an occasional headache thought to be due to sinus pressure.  She denies any chills.  Having normal bowel movements and denies urinary concerns.  Unrelated concern is a mouth sore located in the corner of her mouth which she has tried Neosporin ointment without resolution.  Assessment and Plan: Patient unable to be seen in clinic due to  transportation concerns.  Patient appears to have chronic sinusitis and when she was evaluated by Dr. [name] she had similar concerns.  We will go ahead and treat with Augmentin 875/125 mg tablets twice daily x7 days for total of 14 tablets.  I have also asked she continue taking Claritin/Zyrtec in the morning and Benadryl at bedtime.  We will also try Bactroban ointment she can apply to her mouth sore.  RX Augmentin x7 days.  Rx Bactroban ointment prn  Follow Up Instructions: RTC as scheduled on 05/18/2020 for labs, assessment and chemotherapy.   I discussed the assessment and treatment plan with the patient. The patient was provided an opportunity to ask questions and all were answered. The patient agreed with the plan and demonstrated an understanding of the instructions.   The patient was advised to call back or seek an in-person evaluation if the symptoms worsen or if the condition fails to improve as anticipated.  I provided 22 minutes of non-face-to-face time during this encounter.   Jacquelin Hawking, NP

## 2020-05-11 NOTE — Telephone Encounter (Signed)
Can she come in today- Like 11:30?  Faythe Casa, NP 05/11/2020 10:53 AM

## 2020-05-15 ENCOUNTER — Telehealth: Payer: Medicaid Other | Admitting: Oncology

## 2020-05-18 ENCOUNTER — Other Ambulatory Visit: Payer: Self-pay

## 2020-05-18 ENCOUNTER — Inpatient Hospital Stay (HOSPITAL_BASED_OUTPATIENT_CLINIC_OR_DEPARTMENT_OTHER): Payer: Medicaid Other | Admitting: Oncology

## 2020-05-18 ENCOUNTER — Inpatient Hospital Stay: Payer: Medicaid Other

## 2020-05-18 VITALS — BP 116/78 | HR 77 | Temp 97.0°F | Resp 18

## 2020-05-18 VITALS — BP 108/73 | HR 85 | Temp 97.4°F | Resp 20 | Wt 104.2 lb

## 2020-05-18 DIAGNOSIS — C50212 Malignant neoplasm of upper-inner quadrant of left female breast: Secondary | ICD-10-CM | POA: Diagnosis not present

## 2020-05-18 DIAGNOSIS — Z171 Estrogen receptor negative status [ER-]: Secondary | ICD-10-CM

## 2020-05-18 DIAGNOSIS — Z5111 Encounter for antineoplastic chemotherapy: Secondary | ICD-10-CM | POA: Diagnosis present

## 2020-05-18 LAB — COMPREHENSIVE METABOLIC PANEL
ALT: 10 U/L (ref 0–44)
AST: 11 U/L — ABNORMAL LOW (ref 15–41)
Albumin: 3.4 g/dL — ABNORMAL LOW (ref 3.5–5.0)
Alkaline Phosphatase: 110 U/L (ref 38–126)
Anion gap: 9 (ref 5–15)
BUN: 24 mg/dL — ABNORMAL HIGH (ref 6–20)
CO2: 25 mmol/L (ref 22–32)
Calcium: 8.7 mg/dL — ABNORMAL LOW (ref 8.9–10.3)
Chloride: 105 mmol/L (ref 98–111)
Creatinine, Ser: 0.77 mg/dL (ref 0.44–1.00)
GFR calc Af Amer: >60 mL/min (ref >60)
GFR calc non Af Amer: >60 mL/min (ref >60)
Glucose, Bld: 100 mg/dL — ABNORMAL HIGH (ref 70–99)
Potassium: 4.2 mmol/L (ref 3.5–5.1)
Sodium: 139 mmol/L (ref 135–145)
Total Bilirubin: 0.4 mg/dL (ref 0.3–1.2)
Total Protein: 6.6 g/dL (ref 6.5–8.1)

## 2020-05-18 LAB — CBC WITH DIFFERENTIAL/PLATELET
Abs Immature Granulocytes: 1.36 10*3/uL — ABNORMAL HIGH (ref 0.00–0.07)
Basophils Absolute: 0.2 10*3/uL — ABNORMAL HIGH (ref 0.0–0.1)
Basophils Relative: 1 %
Eosinophils Absolute: 0 10*3/uL (ref 0.0–0.5)
Eosinophils Relative: 0 %
HCT: 27.8 % — ABNORMAL LOW (ref 36.0–46.0)
Hemoglobin: 9.2 g/dL — ABNORMAL LOW (ref 12.0–15.0)
Immature Granulocytes: 8 %
Lymphocytes Relative: 12 %
Lymphs Abs: 2 10*3/uL (ref 0.7–4.0)
MCH: 32.2 pg (ref 26.0–34.0)
MCHC: 33.1 g/dL (ref 30.0–36.0)
MCV: 97.2 fL (ref 80.0–100.0)
Monocytes Absolute: 1.1 10*3/uL — ABNORMAL HIGH (ref 0.1–1.0)
Monocytes Relative: 7 %
Neutro Abs: 12.5 10*3/uL — ABNORMAL HIGH (ref 1.7–7.7)
Neutrophils Relative %: 72 %
Platelets: 446 10*3/uL — ABNORMAL HIGH (ref 150–400)
RBC: 2.86 MIL/uL — ABNORMAL LOW (ref 3.87–5.11)
RDW: 16.2 % — ABNORMAL HIGH (ref 11.5–15.5)
WBC: 17.2 10*3/uL — ABNORMAL HIGH (ref 4.0–10.5)
nRBC: 0.2 % (ref 0.0–0.2)

## 2020-05-18 MED ORDER — SODIUM CHLORIDE 0.9 % IV SOLN
10.0000 mg | Freq: Once | INTRAVENOUS | Status: AC
  Administered 2020-05-18: 10 mg via INTRAVENOUS
  Filled 2020-05-18: qty 10

## 2020-05-18 MED ORDER — FAMOTIDINE IN NACL 20-0.9 MG/50ML-% IV SOLN
20.0000 mg | Freq: Once | INTRAVENOUS | Status: AC
  Administered 2020-05-18: 20 mg via INTRAVENOUS
  Filled 2020-05-18: qty 50

## 2020-05-18 MED ORDER — SODIUM CHLORIDE 0.9 % IV SOLN
80.0000 mg/m2 | Freq: Once | INTRAVENOUS | Status: AC
  Administered 2020-05-18: 120 mg via INTRAVENOUS
  Filled 2020-05-18: qty 20

## 2020-05-18 MED ORDER — ALPRAZOLAM 0.25 MG PO TABS: 0.2500 mg | ORAL_TABLET | Freq: Every evening | ORAL | 0 refills | Status: DC | PRN

## 2020-05-18 MED ORDER — HEPARIN SOD (PORK) LOCK FLUSH 100 UNIT/ML IV SOLN
500.0000 [IU] | Freq: Once | INTRAVENOUS | Status: AC | PRN
  Administered 2020-05-18: 500 [IU]
  Filled 2020-05-18: qty 5

## 2020-05-18 MED ORDER — DIPHENHYDRAMINE HCL 50 MG/ML IJ SOLN
25.0000 mg | Freq: Once | INTRAMUSCULAR | Status: AC
  Administered 2020-05-18: 25 mg via INTRAVENOUS
  Filled 2020-05-18: qty 1

## 2020-05-18 MED ORDER — SODIUM CHLORIDE 0.9 % IV SOLN
Freq: Once | INTRAVENOUS | Status: AC
  Filled 2020-05-18: qty 250

## 2020-05-18 MED ORDER — SODIUM CHLORIDE 0.9% FLUSH
10.0000 mL | Freq: Once | INTRAVENOUS | Status: AC
  Administered 2020-05-18: 10 mL via INTRAVENOUS
  Filled 2020-05-18: qty 10

## 2020-05-18 NOTE — Progress Notes (Signed)
Pt tolerated treatment well. Pt aware to call clinic with any questions or concerns or call 911/report to ER in the event of an emergency. Pt verbalizes understanding. No s/s of distress or reaction noted. Pt and VS stable at discharge.

## 2020-05-18 NOTE — Progress Notes (Signed)
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  INTERVAL HISTORY: Mrs. Deanna Ramsey is a 54 year old female with past medical history significant for arthritis, COPD, depression, hypertension with recent diagnosis of stage Ib triple negative left breast cancer.  She is currently status post 4 cycles of Adriamycin and Cytoxan.  She is beginning weekly Taxol x12 cycles today.  She was evaluated in symptom management last week for sinusitis and treated with Augmentin x5 days.  Symptoms have resolved.  She also had a complaint of a mouth sore located the corner of her mouth and was given Bactroban ointment for complete resolution.  Today, she is nervous to begin new treatment with Taxol but feels well.  She denies any neurological complaints, denies chest pain, shortness of breath, cough or hemoptysis.  She denies any nausea, vomiting, constipation or diarrhea.  REVIEW OF SYSTEMS:   Review of Systems  Constitutional: Negative.  Negative for chills, fever, malaise/fatigue and weight loss.  HENT: Negative for congestion, ear pain and tinnitus.   Eyes: Negative.  Negative for blurred vision and double vision.  Respiratory: Negative.  Negative for cough, sputum production and shortness of breath.   Cardiovascular: Negative.  Negative for chest pain, palpitations and leg swelling.  Gastrointestinal: Negative.  Negative for abdominal pain, constipation, diarrhea, nausea and vomiting.  Genitourinary: Negative for dysuria, frequency and urgency.  Musculoskeletal: Negative for back  pain and falls.  Skin: Negative.  Negative for rash.  Neurological: Negative.  Negative for weakness and headaches.  Endo/Heme/Allergies: Negative.  Does not bruise/bleed easily.  Psychiatric/Behavioral: Negative for depression. The patient is nervous/anxious. The patient does not have insomnia.     As per HPI. Otherwise, a complete review of systems is negative.  PAST MEDICAL HISTORY: Past Medical History:  Diagnosis Date  . Arthritis   . Cellulitis   . COPD (chronic obstructive pulmonary disease) ([place])   . Depression   . History of kidney stones   . History of tracheostomy 2000   stated that epiglottis swelled up for an unknown reason; had trach for about a week.   . Hypertension   . Pneumonia     PAST SURGICAL HISTORY: Past Surgical History:  Procedure Laterality Date  . arm surgery  1984   fractured ulna  . BREAST LUMPECTOMY WITH SENTINEL LYMPH NODE BIOPSY Left 02/04/2020   Procedure: BREAST LUMPECTOMY WITH SENTINEL LYMPH NODE BX;  Surgeon: Jules Husbands, MD;  Location: [facility] ORS;  Service: General;  Laterality: Left;  . CLEFT LIP REPAIR    . FRACTURE SURGERY    . kidney stones    . lymphnode neck    . PORTACATH PLACEMENT N/A 02/04/2020   Procedure: INSERTION PORT-A-CATH;  Surgeon: Jules Husbands, MD;  Location: [facility] ORS;  Service: General;  Laterality: N/A;  . RE-EXCISION OF BREAST LUMPECTOMY Left 02/24/2020   Procedure: RE-EXCISION OF Left BREAST LUMPECTOMY;  Surgeon: Jules Husbands, MD;  Location: [facility] ORS;  Service: General;  Laterality: Left;  . TYMPANOSTOMY TUBE PLACEMENT      FAMILY HISTORY: Family History  Problem Relation Age of Onset  . COPD Other   .  COPD Mother   . Hypertension Mother   . COPD Father   . Breast cancer Neg Hx     ADVANCED DIRECTIVES (Y/N):  N  HEALTH MAINTENANCE: Social History   Tobacco Use  . Smoking status: Current Every Day Smoker    Packs/day: 1.00    Types: Cigarettes  . Smokeless tobacco: Never Used  Vaping Use  .  Vaping Use: Never used  Substance Use Topics  . Alcohol use: No  . Drug use: No     Colonoscopy:  PAP:  Bone density:  Lipid panel:  Allergies  Allergen Reactions  . Sulfa Antibiotics Nausea And Vomiting    Current Outpatient Medications  Medication Sig Dispense Refill  . acetaminophen (TYLENOL) 325 MG tablet Take 650 mg by mouth every 6 (six) hours as needed for moderate pain.    Marland Kitchen albuterol (PROVENTIL HFA;VENTOLIN HFA) 108 (90 Base) MCG/ACT inhaler Inhale 2 puffs into the lungs every 6 (six) hours as needed for wheezing or shortness of breath. 1 Inhaler 2  . ALPRAZolam (XANAX) 0.25 MG tablet Take 1 tablet (0.25 mg total) by mouth at bedtime as needed for anxiety. 30 tablet 0  . amoxicillin-clavulanate (AUGMENTIN) 875-125 MG tablet Take 1 tablet by mouth 2 (two) times daily. 14 tablet 0  . cetirizine (ZYRTEC) 10 MG tablet Take 10 mg by mouth daily.    . ciprofloxacin (CILOXAN) 0.3 % ophthalmic solution Place 2 drops into the left eye every 4 (four) hours while awake. Administer 1 drop, every 2 hours, while awake, for 2 days. Then 1 drop, every 4 hours, while awake, for the next 5 days. 5 mL 1  . fluticasone (FLOVENT HFA) 110 MCG/ACT inhaler Inhale 2 puffs into the lungs daily.     Marland Kitchen lidocaine-prilocaine (EMLA) cream Apply to affected area once 30 g 3  . lisinopril-hydrochlorothiazide (ZESTORETIC) 20-12.5 MG tablet Take 1 tablet by mouth daily.    . mupirocin ointment (BACTROBAN) 2 % Place 1 application into the nose 2 (two) times daily. 22 g 0  . prochlorperazine (COMPAZINE) 10 MG tablet Take 1 tablet (10 mg total) by mouth every 6 (six) hours as needed (Nausea or vomiting). 60 tablet 2   No current facility-administered medications for this visit.    OBJECTIVE: Vitals:   05/18/20 0917  BP: 108/73  Pulse: 85  Resp: 20  Temp: (!) 97.4 F (36.3 C)     Body mass index is 17.08 kg/m.    ECOG FS:0 - Asymptomatic  Physical Exam Constitutional:      Appearance: Normal  appearance.  HENT:     Head: Normocephalic and atraumatic.  Eyes:     Pupils: Pupils are equal, round, and reactive to light.  Cardiovascular:     Rate and Rhythm: Normal rate and regular rhythm.     Heart sounds: Normal heart sounds. No murmur heard.   Pulmonary:     Effort: Pulmonary effort is normal.     Breath sounds: Normal breath sounds. No wheezing.  Abdominal:     General: Bowel sounds are normal. There is no distension.     Palpations: Abdomen is soft.     Tenderness: There is no abdominal tenderness.  Musculoskeletal:        General: Normal range of motion.     Cervical back: Normal range of motion.  Skin:    General: Skin is warm and dry.     Findings: No rash.  Neurological:     Mental Status: She is alert and  oriented to person, place, and time.  Psychiatric:        Judgment: Judgment normal.      LAB RESULTS:  Lab Results  Component Value Date   NA 139 05/18/2020   K 4.2 05/18/2020   CL 105 05/18/2020   CO2 25 05/18/2020   GLUCOSE 100 (H) 05/18/2020   BUN 24 (H) 05/18/2020   CREATININE 0.77 05/18/2020   CALCIUM 8.7 (L) 05/18/2020   PROT 6.6 05/18/2020   ALBUMIN 3.4 (L) 05/18/2020   AST 11 (L) 05/18/2020   ALT 10 05/18/2020   ALKPHOS 110 05/18/2020   BILITOT 0.4 05/18/2020   GFRNONAA >60 05/18/2020   GFRAA >60 05/18/2020    Lab Results  Component Value Date   WBC 17.2 (H) 05/18/2020   NEUTROABS 12.5 (H) 05/18/2020   HGB 9.2 (L) 05/18/2020   HCT 27.8 (L) 05/18/2020   MCV 97.2 05/18/2020   PLT 446 (H) 05/18/2020     STUDIES: No results found.  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  PLAN:    1.  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  Because of positive margins she underwent reexcision on February 24, 2020 with clear margins.  She has now had a port  placed as well.  MUGA scan from March 22, 2020 reported an EF of 72%. Given the triple negative status of her disease, she received adjuvant chemotherapy using Adriamycin and Cytoxan with Udenyca support.  She will begin Taxol x12 today.  Patient will also require adjuvant XRT at the conclusion of her chemotherapy.  An aromatase inhibitor would not offer any benefit given the triple negative status of her disease.  Genetics referral has been placed.  Proceed with cycle 1 Taxol today.  Patient will then return to clinic in 1 week for further evaluation and consideration of cycle 2 of 12 of weekly Taxol.      2.  Anxiety/insomnia: Continue Xanax 0.5 mg at night as needed.  Patient is requesting a refill of the Xanax today.  Will check PDMP website to verify last RX.  3.  Fatigue: Patient does not complain of this today. 4.  Leukocytosis: Secondary to the Udenyca.  Recently treated for sinus infection.  This is completely resolved.  No current signs of infection.  Patient expressed understanding and was in agreement with this plan. She also understands that She can call clinic at any time with any questions, concerns, or complaints.   Cancer Staging Carcinoma of upper-inner quadrant of left female breast [facility]) Staging form: Breast, AJCC 8th Edition - Clinical stage from 02/01/2020: Stage IB (cT1c, cN0, cM0, G3, ER-, PR-, HER2-) - Signed by Lloyd Huger, MD on 02/01/2020   Jacquelin Hawking, NP   05/18/2020 1:11 PM  CC: Dr. [name]

## 2020-05-18 NOTE — Progress Notes (Signed)
Patient denies any concerns today.  

## 2020-05-22 NOTE — Progress Notes (Signed)
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  INTERVAL HISTORY: Patient returns to clinic today for further evaluation and consideration of cycle 2 of 12 of weekly Taxol.  She tolerated her first infusion well, but over the past week and developed significant migraine headache and is extremely tearful today from the pain. She has no other neurologic complaints.  She has a fair appetite and denies weight loss.  She has no chest pain, shortness of breath, cough, or hemoptysis.  She denies any nausea, vomiting, constipation, or diarrhea.  She has no urinary complaints.  Patient offers no further specific complaints today.  REVIEW OF SYSTEMS:   Review of Systems  Constitutional: Negative.  Negative for fever, malaise/fatigue and weight loss.  HENT: Negative for congestion.   Respiratory: Negative.  Negative for cough, hemoptysis and shortness of breath.   Cardiovascular: Negative.  Negative for chest pain and leg swelling.  Gastrointestinal: Negative.  Negative for abdominal pain and nausea.  Genitourinary: Negative.  Negative for dysuria.  Musculoskeletal: Negative.  Negative for back pain.  Skin: Negative.  Negative for rash.  Neurological: Positive for headaches. Negative for dizziness, focal weakness and weakness.  Psychiatric/Behavioral: Negative.  The patient is not nervous/anxious and does not have insomnia.     As per HPI. Otherwise, a complete review of systems is negative.  PAST MEDICAL HISTORY: Past Medical History:  Diagnosis Date  .  Arthritis   . Cellulitis   . COPD (chronic obstructive pulmonary disease) ([place])   . Depression   . History of kidney stones   . History of tracheostomy 2000   stated that epiglottis swelled up for an unknown reason; had trach for about a week.   . Hypertension   . Pneumonia     PAST SURGICAL HISTORY: Past Surgical History:  Procedure Laterality Date  . arm surgery  1984   fractured ulna  . BREAST LUMPECTOMY WITH SENTINEL LYMPH NODE BIOPSY Left 02/04/2020   Procedure: BREAST LUMPECTOMY WITH SENTINEL LYMPH NODE BX;  Surgeon: Jules Husbands, MD;  Location: [facility] ORS;  Service: General;  Laterality: Left;  . CLEFT LIP REPAIR    . FRACTURE SURGERY    . kidney stones    . lymphnode neck    . PORTACATH PLACEMENT N/A 02/04/2020   Procedure: INSERTION PORT-A-CATH;  Surgeon: Jules Husbands, MD;  Location: [facility] ORS;  Service: General;  Laterality: N/A;  . RE-EXCISION OF BREAST LUMPECTOMY Left 02/24/2020   Procedure: RE-EXCISION OF Left BREAST LUMPECTOMY;  Surgeon: Jules Husbands, MD;  Location: [facility] ORS;  Service: General;  Laterality: Left;  . TYMPANOSTOMY TUBE PLACEMENT      FAMILY HISTORY: Family History  Problem Relation Age of Onset  . COPD Other   . COPD Mother   . Hypertension Mother   . COPD Father   . Breast cancer Neg Hx     ADVANCED DIRECTIVES (Y/N):  N  HEALTH MAINTENANCE: Social History   Tobacco Use  . Smoking status: Current Every Day Smoker    Packs/day: 1.00    Types: Cigarettes  . Smokeless tobacco: Never Used  Vaping Use  . Vaping Use: Never used  Substance Use Topics  . Alcohol use: No  . Drug use: No     Colonoscopy:  PAP:  Bone density:  Lipid panel:  Allergies  Allergen Reactions  . Sulfa Antibiotics Nausea And Vomiting    Current Outpatient Medications  Medication Sig Dispense Refill  . acetaminophen (TYLENOL) 325 MG tablet Take 650 mg by mouth every 6 (six) hours as needed for moderate pain.    Marland Kitchen albuterol (PROVENTIL HFA;VENTOLIN HFA) 108  (90 Base) MCG/ACT inhaler Inhale 2 puffs into the lungs every 6 (six) hours as needed for wheezing or shortness of breath. 1 Inhaler 2  . ALPRAZolam (XANAX) 0.25 MG tablet Take 1 tablet (0.25 mg total) by mouth at bedtime as needed for anxiety. 30 tablet 0  . amoxicillin-clavulanate (AUGMENTIN) 875-125 MG tablet Take 1 tablet by mouth 2 (two) times daily. 14 tablet 0  . cetirizine (ZYRTEC) 10 MG tablet Take 10 mg by mouth daily.    . ciprofloxacin (CILOXAN) 0.3 % ophthalmic solution Place 2 drops into the left eye every 4 (four) hours while awake. Administer 1 drop, every 2 hours, while awake, for 2 days. Then 1 drop, every 4 hours, while awake, for the next 5 days. 5 mL 1  . fluticasone (FLOVENT HFA) 110 MCG/ACT inhaler Inhale 2 puffs into the lungs daily.     Marland Kitchen lidocaine-prilocaine (EMLA) cream Apply to affected area once 30 g 3  . lisinopril-hydrochlorothiazide (ZESTORETIC) 20-12.5 MG tablet Take 1 tablet by mouth daily.    . mupirocin ointment (BACTROBAN) 2 % Place 1 application into the nose 2 (two) times daily. 22 g 0  . prochlorperazine (COMPAZINE) 10 MG tablet Take 1 tablet (10 mg total) by mouth every 6 (six) hours as needed (Nausea or vomiting). 60 tablet 2  . butalbital-acetaminophen-caffeine (FIORICET) 50-325-40 MG tablet Take 1-2 tablets by mouth every 6 (six) hours as needed for headache. 20 tablet 0   No current facility-administered medications for this visit.    OBJECTIVE: Vitals:   05/25/20 0920  BP: 131/86  Pulse: 93  Resp: 18  Temp: (!) 96.6 F (35.9 C)  SpO2: 95%     Body mass index is 16.65 kg/m.    ECOG FS:0 - Asymptomatic  General: Well-developed, well-nourished, no acute distress. Eyes: Pink conjunctiva, anicteric sclera. HEENT: Normocephalic, moist mucous membranes. Lungs: No audible wheezing or coughing. Heart: Regular rate and rhythm. Abdomen: Soft, nontender, no obvious distention. Musculoskeletal: No edema, cyanosis, or clubbing. Neuro: Alert, answering  all questions appropriately. Cranial nerves grossly intact. Skin: No rashes or petechiae noted. Psych: Tearful.   LAB RESULTS:  Lab Results  Component Value Date   NA 138 05/25/2020   K 4.2 05/25/2020   CL 105 05/25/2020   CO2 23 05/25/2020   GLUCOSE 104 (H) 05/25/2020   BUN 30 (H) 05/25/2020   CREATININE 1.07 (H) 05/25/2020   CALCIUM 8.8 (L) 05/25/2020   PROT 7.1 05/25/2020   ALBUMIN 3.9 05/25/2020   AST 15 05/25/2020   ALT 11 05/25/2020   ALKPHOS 77 05/25/2020   BILITOT 0.5 05/25/2020   GFRNONAA 59 (L) 05/25/2020   GFRAA >60 05/25/2020    Lab Results  Component Value Date   WBC 7.5 05/25/2020   NEUTROABS 5.9 05/25/2020   HGB 9.1 (L) 05/25/2020   HCT 27.3 (L) 05/25/2020   MCV 98.2 05/25/2020   PLT 513 (H) 05/25/2020     STUDIES: No results found.  ASSESSMENT: Pathologic stage IB  triple negative invasive carcinoma of the upper inner quadrant of left breast.  PLAN:    1.  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  Because of positive margins she underwent reexcision on February 24, 2020 with clear margins.  She has now had a port placed as well.  MUGA scan from March 22, 2020 reported an EF of 72%. Given the triple negative status of her disease, she will receive adjuvant chemotherapy using Adriamycin and Cytoxan with Udenyca support followed by weekly Taxol x12.  Patient will also require adjuvant XRT at the conclusion of her chemotherapy.  An aromatase inhibitor would not offer any benefit given the triple negative status of her disease.  Genetics referral has been placed.  Patient completed 4 cycles of Adriamycin and Cytoxan on May 04, 2020.  Delay cycle 2 of weekly Taxol secondary to significant migraines.  Return to clinic in 1 week for further evaluation and reconsideration of cycle 2.       2.  Anxiety/insomnia: Continue Xanax 0.5 mg at night as needed. 3.   Fatigue: Patient does not complain of this today. 4.  Leukopenia: Resolved. 5.  Migraines: Delay treatment as above.  Patient was given a prescription for Fioricet. 6.  Depression: Patient declined initiation of antidepressant medication or referral for counseling.  Patient expressed understanding and was in agreement with this plan. She also understands that She can call clinic at any time with any questions, concerns, or complaints.   Cancer Staging Carcinoma of upper-inner quadrant of left female breast [facility]) Staging form: Breast, AJCC 8th Edition - Clinical stage from 02/01/2020: Stage IB (cT1c, cN0, cM0, G3, ER-, PR-, HER2-) - Signed by Lloyd Huger, MD on 02/01/2020   Lloyd Huger, MD   05/26/2020 6:17 AM

## 2020-05-24 ENCOUNTER — Encounter: Payer: Self-pay | Admitting: Oncology

## 2020-05-24 NOTE — Progress Notes (Signed)
Called patient for pre assessment. She reports no pain or concerns at this time.

## 2020-05-25 ENCOUNTER — Inpatient Hospital Stay: Payer: Medicaid Other

## 2020-05-25 ENCOUNTER — Other Ambulatory Visit: Payer: Self-pay | Admitting: Emergency Medicine

## 2020-05-25 ENCOUNTER — Encounter: Payer: Self-pay | Admitting: Oncology

## 2020-05-25 ENCOUNTER — Inpatient Hospital Stay (HOSPITAL_BASED_OUTPATIENT_CLINIC_OR_DEPARTMENT_OTHER): Payer: Medicaid Other | Admitting: Oncology

## 2020-05-25 ENCOUNTER — Other Ambulatory Visit: Payer: Self-pay

## 2020-05-25 VITALS — BP 131/86 | HR 93 | Temp 96.6°F | Resp 18 | Wt 101.6 lb

## 2020-05-25 DIAGNOSIS — C50212 Malignant neoplasm of upper-inner quadrant of left female breast: Secondary | ICD-10-CM

## 2020-05-25 DIAGNOSIS — G43909 Migraine, unspecified, not intractable, without status migrainosus: Secondary | ICD-10-CM | POA: Diagnosis not present

## 2020-05-25 DIAGNOSIS — Z171 Estrogen receptor negative status [ER-]: Secondary | ICD-10-CM

## 2020-05-25 DIAGNOSIS — Z95828 Presence of other vascular implants and grafts: Secondary | ICD-10-CM

## 2020-05-25 DIAGNOSIS — Z5111 Encounter for antineoplastic chemotherapy: Secondary | ICD-10-CM | POA: Diagnosis not present

## 2020-05-25 LAB — CBC WITH DIFFERENTIAL/PLATELET
Abs Immature Granulocytes: 0.14 10*3/uL — ABNORMAL HIGH (ref 0.00–0.07)
Basophils Absolute: 0.1 10*3/uL (ref 0.0–0.1)
Basophils Relative: 2 %
Eosinophils Absolute: 0 10*3/uL (ref 0.0–0.5)
Eosinophils Relative: 0 %
HCT: 27.3 % — ABNORMAL LOW (ref 36.0–46.0)
Hemoglobin: 9.1 g/dL — ABNORMAL LOW (ref 12.0–15.0)
Immature Granulocytes: 2 %
Lymphocytes Relative: 7 %
Lymphs Abs: 0.5 10*3/uL — ABNORMAL LOW (ref 0.7–4.0)
MCH: 32.7 pg (ref 26.0–34.0)
MCHC: 33.3 g/dL (ref 30.0–36.0)
MCV: 98.2 fL (ref 80.0–100.0)
Monocytes Absolute: 0.7 10*3/uL (ref 0.1–1.0)
Monocytes Relative: 10 %
Neutro Abs: 5.9 10*3/uL (ref 1.7–7.7)
Neutrophils Relative %: 79 %
Platelets: 513 10*3/uL — ABNORMAL HIGH (ref 150–400)
RBC: 2.78 MIL/uL — ABNORMAL LOW (ref 3.87–5.11)
RDW: 17.3 % — ABNORMAL HIGH (ref 11.5–15.5)
WBC: 7.5 10*3/uL (ref 4.0–10.5)
nRBC: 0 % (ref 0.0–0.2)

## 2020-05-25 LAB — COMPREHENSIVE METABOLIC PANEL
ALT: 11 U/L (ref 0–44)
AST: 15 U/L (ref 15–41)
Albumin: 3.9 g/dL (ref 3.5–5.0)
Alkaline Phosphatase: 77 U/L (ref 38–126)
Anion gap: 10 (ref 5–15)
BUN: 30 mg/dL — ABNORMAL HIGH (ref 6–20)
CO2: 23 mmol/L (ref 22–32)
Calcium: 8.8 mg/dL — ABNORMAL LOW (ref 8.9–10.3)
Chloride: 105 mmol/L (ref 98–111)
Creatinine, Ser: 1.07 mg/dL — ABNORMAL HIGH (ref 0.44–1.00)
GFR calc Af Amer: >60 mL/min (ref >60)
GFR calc non Af Amer: 59 mL/min — ABNORMAL LOW (ref >60)
Glucose, Bld: 104 mg/dL — ABNORMAL HIGH (ref 70–99)
Potassium: 4.2 mmol/L (ref 3.5–5.1)
Sodium: 138 mmol/L (ref 135–145)
Total Bilirubin: 0.5 mg/dL (ref 0.3–1.2)
Total Protein: 7.1 g/dL (ref 6.5–8.1)

## 2020-05-25 MED ORDER — HEPARIN SOD (PORK) LOCK FLUSH 100 UNIT/ML IV SOLN
500.0000 [IU] | Freq: Once | INTRAVENOUS | Status: AC
  Administered 2020-05-25: 500 [IU] via INTRAVENOUS
  Filled 2020-05-25: qty 5

## 2020-05-25 MED ORDER — SODIUM CHLORIDE 0.9% FLUSH
10.0000 mL | INTRAVENOUS | Status: DC | PRN
  Administered 2020-05-25: 10 mL via INTRAVENOUS
  Filled 2020-05-25: qty 10

## 2020-05-25 MED ORDER — BUTALBITAL-APAP-CAFFEINE 50-325-40 MG PO TABS: 1.0000 | ORAL_TABLET | Freq: Four times a day (QID) | ORAL | 0 refills | Status: DC | PRN

## 2020-05-25 NOTE — Progress Notes (Signed)
Patient today expressed hopelessness in her situation. States she is just very tired and feels like she wants to give up. Tired of dealing with pain. She hasn't been eating and has no energy to cook. She has no income coming in. She states medication doesn't help with constant headaches.

## 2020-05-28 NOTE — Progress Notes (Signed)
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  INTERVAL HISTORY: Patient returns to clinic today for further evaluation and reconsideration of cycle 2 of 12 of weekly Taxol.  Her headaches have significantly improved with Fioricet, but she has developed a chronic cough for the last 6 days that keeps her awake at night.  She has had low-grade fevers at home, but none above 100 F.  She has no other neurologic complaints.  She has a good appetite, but feels she is losing weight.  She has no chest pain, shortness of breath, or hemoptysis.  She denies any nausea, vomiting, constipation, or diarrhea.  She has no urinary complaints.  Patient offers no further specific complaints today.  REVIEW OF SYSTEMS:   Review of Systems  Constitutional: Negative.  Negative for fever, malaise/fatigue and weight loss.  HENT: Negative for congestion.   Respiratory: Positive for cough. Negative for hemoptysis and shortness of breath.   Cardiovascular: Negative.  Negative for chest pain and leg swelling.  Gastrointestinal: Negative.  Negative for abdominal pain and nausea.  Genitourinary: Negative.  Negative for dysuria.  Musculoskeletal: Negative.  Negative for back pain.  Skin: Negative.  Negative for rash.  Neurological: Positive for headaches. Negative for dizziness, focal weakness and weakness.  Psychiatric/Behavioral: The patient has insomnia. The patient is not nervous/anxious.     As per HPI. Otherwise, a complete review of systems is negative.  PAST MEDICAL  HISTORY: Past Medical History:  Diagnosis Date  . Arthritis   . Cellulitis   . COPD (chronic obstructive pulmonary disease) ([place])   . Depression   . History of kidney stones   . History of tracheostomy 2000   stated that epiglottis swelled up for an unknown reason; had trach for about a week.   . Hypertension   . Pneumonia     PAST SURGICAL HISTORY: Past Surgical History:  Procedure Laterality Date  . arm surgery  1984   fractured ulna  . BREAST LUMPECTOMY WITH SENTINEL LYMPH NODE BIOPSY Left 02/04/2020   Procedure: BREAST LUMPECTOMY WITH SENTINEL LYMPH NODE BX;  Surgeon: Jules Husbands, MD;  Location: [facility] ORS;  Service: General;  Laterality: Left;  . CLEFT LIP REPAIR    . FRACTURE SURGERY    . kidney stones    . lymphnode neck    . PORTACATH PLACEMENT N/A 02/04/2020   Procedure: INSERTION PORT-A-CATH;  Surgeon: Jules Husbands, MD;  Location: [facility] ORS;  Service: General;  Laterality: N/A;  . RE-EXCISION OF BREAST LUMPECTOMY Left 02/24/2020   Procedure: RE-EXCISION OF Left BREAST LUMPECTOMY;  Surgeon: Jules Husbands, MD;  Location: [facility] ORS;  Service: General;  Laterality: Left;  . TYMPANOSTOMY TUBE PLACEMENT      FAMILY HISTORY: Family History  Problem Relation Age of Onset  . COPD Other   . COPD Mother   . Hypertension Mother   . COPD Father   . Breast cancer Neg Hx     ADVANCED DIRECTIVES (Y/N):  N  HEALTH MAINTENANCE: Social History   Tobacco Use  . Smoking status: Current Every Day Smoker    Packs/day:  1.00    Types: Cigarettes  . Smokeless tobacco: Never Used  Vaping Use  . Vaping Use: Never used  Substance Use Topics  . Alcohol use: No  . Drug use: No     Colonoscopy:  PAP:  Bone density:  Lipid panel:  Allergies  Allergen Reactions  . Sulfa Antibiotics Nausea And Vomiting    Current Outpatient Medications  Medication Sig Dispense Refill  . acetaminophen (TYLENOL) 325 MG tablet Take 650 mg by mouth every 6 (six) hours as needed for moderate  pain.    Marland Kitchen albuterol (PROVENTIL HFA;VENTOLIN HFA) 108 (90 Base) MCG/ACT inhaler Inhale 2 puffs into the lungs every 6 (six) hours as needed for wheezing or shortness of breath. 1 Inhaler 2  . ALPRAZolam (XANAX) 0.25 MG tablet Take 1 tablet (0.25 mg total) by mouth at bedtime as needed for anxiety. 30 tablet 0  . amoxicillin-clavulanate (AUGMENTIN) 875-125 MG tablet Take 1 tablet by mouth 2 (two) times daily. 14 tablet 0  . butalbital-acetaminophen-caffeine (FIORICET) 50-325-40 MG tablet Take 1-2 tablets by mouth every 6 (six) hours as needed for headache. 20 tablet 0  . cetirizine (ZYRTEC) 10 MG tablet Take 10 mg by mouth daily.    . chlorpheniramine-HYDROcodone (TUSSIONEX) 10-8 MG/5ML SUER Take 5 mLs by mouth every 12 (twelve) hours as needed for cough. 140 mL 0  . ciprofloxacin (CILOXAN) 0.3 % ophthalmic solution Place 2 drops into the left eye every 4 (four) hours while awake. Administer 1 drop, every 2 hours, while awake, for 2 days. Then 1 drop, every 4 hours, while awake, for the next 5 days. 5 mL 1  . fluticasone (FLOVENT HFA) 110 MCG/ACT inhaler Inhale 2 puffs into the lungs daily.     Marland Kitchen HYDROcodone-homatropine (HYCODAN) 5-1.5 MG/5ML syrup Take 5 mLs by mouth every 6 (six) hours as needed for cough. 120 mL 0  . lidocaine-prilocaine (EMLA) cream Apply to affected area once 30 g 3  . lisinopril-hydrochlorothiazide (ZESTORETIC) 20-12.5 MG tablet Take 1 tablet by mouth daily.    . mupirocin ointment (BACTROBAN) 2 % Place 1 application into the nose 2 (two) times daily. 22 g 0  . predniSONE (STERAPRED UNI-PAK 21 TAB) 10 MG (21) TBPK tablet Take as directed. 21 tablet 0  . prochlorperazine (COMPAZINE) 10 MG tablet Take 1 tablet (10 mg total) by mouth every 6 (six) hours as needed (Nausea or vomiting). 60 tablet 2  . levofloxacin (LEVAQUIN) 500 MG tablet Take 1 tablet (500 mg total) by mouth daily. 7 tablet 0   No current facility-administered medications for this visit.   Facility-Administered  Medications Ordered in Other Visits  Medication Dose Route Frequency Provider Last Rate Last Admin  . sodium chloride flush (NS) 0.9 % injection 10 mL  10 mL Intravenous PRN Lloyd Huger, MD   10 mL at 06/01/20 0905    OBJECTIVE: Vitals:   06/01/20 0919  BP: 108/77  Pulse: 96  Resp: 20  Temp: (!) 96.8 F (36 C)  SpO2: 97%     Body mass index is 16.09 kg/m.    ECOG FS:0 - Asymptomatic  General: Well-developed, well-nourished, no acute distress. Eyes: Pink conjunctiva, anicteric sclera. HEENT: Normocephalic, moist mucous membranes. Lungs: No audible wheezing or coughing. Heart: Regular rate and rhythm. Abdomen: Soft, nontender, no obvious distention. Musculoskeletal: No edema, cyanosis, or clubbing. Neuro: Alert, answering all questions appropriately. Cranial nerves grossly intact. Skin: No rashes or petechiae noted. Psych: Normal affect.   LAB RESULTS:  Lab Results  Component  Value Date   NA 137 06/01/2020   K 4.3 06/01/2020   CL 104 06/01/2020   CO2 21 (L) 06/01/2020   GLUCOSE 147 (H) 06/01/2020   BUN 28 (H) 06/01/2020   CREATININE 1.28 (H) 06/01/2020   CALCIUM 8.8 (L) 06/01/2020   PROT 7.3 06/01/2020   ALBUMIN 4.1 06/01/2020   AST 18 06/01/2020   ALT 12 06/01/2020   ALKPHOS 99 06/01/2020   BILITOT 0.5 06/01/2020   GFRNONAA 48 (L) 06/01/2020   GFRAA 55 (L) 06/01/2020    Lab Results  Component Value Date   WBC 10.7 (H) 06/01/2020   NEUTROABS 9.7 (H) 06/01/2020   HGB 11.1 (L) 06/01/2020   HCT 33.6 (L) 06/01/2020   MCV 97.4 06/01/2020   PLT 286 06/01/2020     STUDIES: DG Chest 2 View  Result Date: 05/31/2020 CLINICAL DATA:  Cough, congestion EXAM: CHEST - 2 VIEW COMPARISON:  02/04/2020 FINDINGS: The heart size and mediastinal contours are within normal limits. Right chest port catheter. Both lungs are clear. The visualized skeletal structures are unremarkable. IMPRESSION: No acute abnormality of the lungs. Electronically Signed   By: Eddie Candle  M.D.   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  PLAN:    1.  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  Because of positive margins she underwent reexcision on February 24, 2020 with clear margins.  She has now had a port placed as well.  MUGA scan from March 22, 2020 reported an EF of 72%. Given the triple negative status of her disease, she will receive adjuvant chemotherapy using Adriamycin and Cytoxan with Udenyca support followed by weekly Taxol x12.  Patient will also require adjuvant XRT at the conclusion of her chemotherapy.  An aromatase inhibitor would not offer any benefit given the triple negative status of her disease.  Genetics referral has been placed.  Patient completed 4 cycles of Adriamycin and Cytoxan on May 04, 2020.  Once again delay cycle 2 of weekly Taxol secondary to cough.  Return to clinic in 1 week for further evaluation and reconsideration of cycle 2.   2.  Anxiety/insomnia: Continue Xanax 0.5 mg at night as needed. 3.  Migraines: Improved.  Continue Fioricet as needed. 4.  Depression: Patient declined initiation of antidepressant medication or referral for counseling. 5.  Cough: Chest x-ray reviewed independently with no obvious pathology.  Patient has been instructed to continue her her inhalers and steroid prescription as prescribed.  She was also given a prescription for Levaquin today. 6.  Anemia: Improved.  Patient's hemoglobin is 11.1 today. 7.  Renal insufficiency: Patient's creatinine has trended up to 1.28, she has declined IV fluids.  Patient expressed understanding and was in agreement with this plan. She also understands that She can call clinic at any time with any questions, concerns, or complaints.   Cancer Staging Carcinoma of upper-inner quadrant of  left female breast [facility]) Staging form: Breast, AJCC 8th Edition - Clinical stage from 02/01/2020: Stage IB (cT1c, cN0, cM0, G3, ER-, PR-, HER2-) - Signed by Lloyd Huger, MD on 02/01/2020   Lloyd Huger, MD   06/01/2020 1:51 PM

## 2020-05-29 ENCOUNTER — Encounter: Payer: Self-pay | Admitting: Emergency Medicine

## 2020-05-29 ENCOUNTER — Other Ambulatory Visit: Payer: Self-pay | Admitting: Emergency Medicine

## 2020-05-29 ENCOUNTER — Other Ambulatory Visit: Payer: Self-pay | Admitting: Oncology

## 2020-05-29 DIAGNOSIS — R059 Cough, unspecified: Secondary | ICD-10-CM

## 2020-05-29 NOTE — Progress Notes (Signed)
Re: Cough   Orders placed for stat chest x-ray.  Unfortunately, patient has transportation issues.  Instructed patient to get chest x-ray whenever she was able to make it.   I do not feel comfortable prescribing antibiotics without patient being seen or without imaging.  Faythe Casa, NP 05/29/2020 9:41 AM

## 2020-05-29 NOTE — Telephone Encounter (Signed)
Can she have a chest X-ray done? I don't feel comfortable prescribing her more antibiotics without figuring out what is going on.  Faythe Casa, NP 05/29/2020 8:57 AM

## 2020-05-29 NOTE — Telephone Encounter (Signed)
Patient called reporting that she started having a congested cough Saturday morning with intermittent low grade fever and associated fatigue and weakness. Patient with junky sounding cough while on phone, reports that she is not coughing anything up. Asking that letter be faxed to her employer taking her out of work as she's at work right now. Also reporting that she just finished augmentin and requesting another round of antibiotics and also something for the cough, since any cough medicine that we give her will be cheaper than anything she can get OTC, and she is running short on money at the moment.

## 2020-05-29 NOTE — Telephone Encounter (Signed)
Spoke with patient. She's unable to have a CXR done today due to transportation, however will let us know if she's not feeling better in the next couple of days, then will go have one done.

## 2020-05-30 ENCOUNTER — Other Ambulatory Visit: Payer: Self-pay | Admitting: Oncology

## 2020-05-30 MED ORDER — HYDROCOD POLST-CPM POLST ER 10-8 MG/5ML PO SUER: 5.0000 mL | Freq: Two times a day (BID) | ORAL | 0 refills | Status: DC | PRN

## 2020-05-30 NOTE — Progress Notes (Signed)
Re: Cough  Rx Tussionex every 12 hours as needed for cough.  Patient will have chest x-ray completed tomorrow morning.  Faythe Casa, NP 05/30/2020 3:49 PM

## 2020-05-31 ENCOUNTER — Ambulatory Visit
Admission: RE | Admit: 2020-05-31 | Discharge: 2020-05-31 | Disposition: A | Discharge status: Home / Self Care | Payer: Medicaid Other | Source: Ambulatory Visit | Attending: Oncology | Admitting: Oncology

## 2020-05-31 ENCOUNTER — Other Ambulatory Visit: Payer: Self-pay

## 2020-05-31 ENCOUNTER — Ambulatory Visit
Admission: RE | Admit: 2020-05-31 | Discharge: 2020-05-31 | Disposition: A | Discharge status: Home / Self Care | Payer: Medicaid Other | Attending: Oncology | Admitting: Oncology

## 2020-05-31 ENCOUNTER — Other Ambulatory Visit: Payer: Self-pay | Admitting: Oncology

## 2020-05-31 ENCOUNTER — Encounter: Payer: Self-pay | Admitting: Oncology

## 2020-05-31 DIAGNOSIS — R05 Cough: Secondary | ICD-10-CM | POA: Diagnosis present

## 2020-05-31 DIAGNOSIS — R059 Cough, unspecified: Secondary | ICD-10-CM

## 2020-05-31 MED ORDER — HYDROCOD POLST-CPM POLST ER 10-8 MG/5ML PO SUER: 5.0000 mL | Freq: Two times a day (BID) | ORAL | 0 refills | Status: DC | PRN

## 2020-05-31 MED ORDER — PREDNISONE 10 MG (21) PO TBPK
ORAL_TABLET | ORAL | 0 refills | Status: DC
Start: 2020-05-31 — End: 2020-06-29

## 2020-05-31 MED ORDER — HYDROCODONE-HOMATROPINE 5-1.5 MG/5ML PO SYRP: 5.0000 mL | ORAL_SOLUTION | Freq: Four times a day (QID) | ORAL | 0 refills | Status: DC | PRN

## 2020-05-31 NOTE — Progress Notes (Signed)
Re: Cough  Chest x-ray results were negative for acute process.  Patient to continue Tussionex as needed for cough. Tussionex was too expensive so will try Hycodan instead.   Can try OTC Mucinex.  Continue to use albuterol inhaler.  Can trial 6-day course of tapered steroids.   No antibiotics at this time.   Faythe Casa, NP 05/31/2020 1:40 PM

## 2020-06-01 ENCOUNTER — Inpatient Hospital Stay: Payer: Medicaid Other

## 2020-06-01 ENCOUNTER — Inpatient Hospital Stay (HOSPITAL_BASED_OUTPATIENT_CLINIC_OR_DEPARTMENT_OTHER): Payer: Medicaid Other | Admitting: Oncology

## 2020-06-01 ENCOUNTER — Encounter: Payer: Self-pay | Admitting: Oncology

## 2020-06-01 VITALS — BP 108/77 | HR 96 | Temp 96.8°F | Resp 20 | Wt 98.2 lb

## 2020-06-01 DIAGNOSIS — Z171 Estrogen receptor negative status [ER-]: Secondary | ICD-10-CM

## 2020-06-01 DIAGNOSIS — C50212 Malignant neoplasm of upper-inner quadrant of left female breast: Secondary | ICD-10-CM

## 2020-06-01 DIAGNOSIS — Z95828 Presence of other vascular implants and grafts: Secondary | ICD-10-CM

## 2020-06-01 DIAGNOSIS — Z5111 Encounter for antineoplastic chemotherapy: Secondary | ICD-10-CM | POA: Diagnosis not present

## 2020-06-01 LAB — COMPREHENSIVE METABOLIC PANEL
ALT: 12 U/L (ref 0–44)
AST: 18 U/L (ref 15–41)
Albumin: 4.1 g/dL (ref 3.5–5.0)
Alkaline Phosphatase: 99 U/L (ref 38–126)
Anion gap: 12 (ref 5–15)
BUN: 28 mg/dL — ABNORMAL HIGH (ref 6–20)
CO2: 21 mmol/L — ABNORMAL LOW (ref 22–32)
Calcium: 8.8 mg/dL — ABNORMAL LOW (ref 8.9–10.3)
Chloride: 104 mmol/L (ref 98–111)
Creatinine, Ser: 1.28 mg/dL — ABNORMAL HIGH (ref 0.44–1.00)
GFR calc Af Amer: 55 mL/min — ABNORMAL LOW (ref >60)
GFR calc non Af Amer: 48 mL/min — ABNORMAL LOW (ref >60)
Glucose, Bld: 147 mg/dL — ABNORMAL HIGH (ref 70–99)
Potassium: 4.3 mmol/L (ref 3.5–5.1)
Sodium: 137 mmol/L (ref 135–145)
Total Bilirubin: 0.5 mg/dL (ref 0.3–1.2)
Total Protein: 7.3 g/dL (ref 6.5–8.1)

## 2020-06-01 LAB — CBC WITH DIFFERENTIAL/PLATELET
Abs Immature Granulocytes: 0.05 10*3/uL (ref 0.00–0.07)
Basophils Absolute: 0.1 10*3/uL (ref 0.0–0.1)
Basophils Relative: 1 %
Eosinophils Absolute: 0 10*3/uL (ref 0.0–0.5)
Eosinophils Relative: 0 %
HCT: 33.6 % — ABNORMAL LOW (ref 36.0–46.0)
Hemoglobin: 11.1 g/dL — ABNORMAL LOW (ref 12.0–15.0)
Immature Granulocytes: 1 %
Lymphocytes Relative: 5 %
Lymphs Abs: 0.5 10*3/uL — ABNORMAL LOW (ref 0.7–4.0)
MCH: 32.2 pg (ref 26.0–34.0)
MCHC: 33 g/dL (ref 30.0–36.0)
MCV: 97.4 fL (ref 80.0–100.0)
Monocytes Absolute: 0.3 10*3/uL (ref 0.1–1.0)
Monocytes Relative: 3 %
Neutro Abs: 9.7 10*3/uL — ABNORMAL HIGH (ref 1.7–7.7)
Neutrophils Relative %: 90 %
Platelets: 286 10*3/uL (ref 150–400)
RBC: 3.45 MIL/uL — ABNORMAL LOW (ref 3.87–5.11)
RDW: 17.9 % — ABNORMAL HIGH (ref 11.5–15.5)
WBC: 10.7 10*3/uL — ABNORMAL HIGH (ref 4.0–10.5)
nRBC: 0 % (ref 0.0–0.2)

## 2020-06-01 MED ORDER — SODIUM CHLORIDE 0.9% FLUSH
10.0000 mL | INTRAVENOUS | Status: AC | PRN
  Administered 2020-06-01: 10 mL via INTRAVENOUS
  Filled 2020-06-01: qty 10

## 2020-06-01 MED ORDER — HEPARIN SOD (PORK) LOCK FLUSH 100 UNIT/ML IV SOLN
500.0000 [IU] | Freq: Once | INTRAVENOUS | Status: AC
  Administered 2020-06-01: 500 [IU] via INTRAVENOUS
  Filled 2020-06-01: qty 5

## 2020-06-01 MED ORDER — LEVOFLOXACIN 500 MG PO TABS
500.0000 mg | ORAL_TABLET | Freq: Every day | ORAL | 0 refills | Status: DC
Start: 2020-06-01 — End: 2020-06-29

## 2020-06-01 NOTE — Progress Notes (Signed)
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  INTERVAL HISTORY: Patient returns to clinic today for further evaluation and reconsideration of cycle 2 of 12 of weekly Taxol.  She does not complain of headache today and her cough has significantly improved.  She has no neurologic complaints.  She has a fair appetite.  She has no chest pain, shortness of breath, or hemoptysis.  She denies any nausea, vomiting, constipation, or diarrhea.  She has no urinary complaints.  Patient offers no further specific complaints today.  REVIEW OF SYSTEMS:   Review of Systems  Constitutional: Negative.  Negative for fever, malaise/fatigue and weight loss.  HENT: Negative for congestion.   Respiratory: Positive for cough. Negative for hemoptysis and shortness of breath.   Cardiovascular: Negative.  Negative for chest pain and leg swelling.  Gastrointestinal: Negative.  Negative for abdominal pain and nausea.  Genitourinary: Negative.  Negative for dysuria.  Musculoskeletal: Negative.  Negative for back pain.  Skin: Negative.  Negative for rash.  Neurological: Positive for headaches. Negative for dizziness, focal weakness and weakness.  Psychiatric/Behavioral: The patient has insomnia. The patient is not nervous/anxious.     As per HPI. Otherwise, a complete review of systems is negative.  PAST MEDICAL HISTORY: Past Medical History:  Diagnosis Date  . Arthritis   . Cellulitis   . COPD (chronic obstructive pulmonary disease) ([place])   . Depression   . History of kidney  stones   . History of tracheostomy 2000   stated that epiglottis swelled up for an unknown reason; had trach for about a week.   . Hypertension   . Pneumonia     PAST SURGICAL HISTORY: Past Surgical History:  Procedure Laterality Date  . arm surgery  1984   fractured ulna  . BREAST LUMPECTOMY WITH SENTINEL LYMPH NODE BIOPSY Left 02/04/2020   Procedure: BREAST LUMPECTOMY WITH SENTINEL LYMPH NODE BX;  Surgeon: Jules Husbands, MD;  Location: [facility] ORS;  Service: General;  Laterality: Left;  . CLEFT LIP REPAIR    . FRACTURE SURGERY    . kidney stones    . lymphnode neck    . PORTACATH PLACEMENT N/A 02/04/2020   Procedure: INSERTION PORT-A-CATH;  Surgeon: Jules Husbands, MD;  Location: [facility] ORS;  Service: General;  Laterality: N/A;  . RE-EXCISION OF BREAST LUMPECTOMY Left 02/24/2020   Procedure: RE-EXCISION OF Left BREAST LUMPECTOMY;  Surgeon: Jules Husbands, MD;  Location: [facility] ORS;  Service: General;  Laterality: Left;  . TYMPANOSTOMY TUBE PLACEMENT      FAMILY HISTORY: Family History  Problem Relation Age of Onset  . COPD Other   . COPD Mother   . Hypertension Mother   . COPD Father   . Breast cancer Neg Hx     ADVANCED DIRECTIVES (Y/N):  N  HEALTH MAINTENANCE: Social History   Tobacco Use  . Smoking status: Current Every Day Smoker    Packs/day: 1.00    Types: Cigarettes  . Smokeless tobacco: Never Used  Vaping Use  . Vaping Use: Never used  Substance Use Topics  . Alcohol use: No  .  Drug use: No     Colonoscopy:  PAP:  Bone density:  Lipid panel:  Allergies  Allergen Reactions  . Sulfa Antibiotics Nausea And Vomiting    Current Outpatient Medications  Medication Sig Dispense Refill  . acetaminophen (TYLENOL) 325 MG tablet Take 650 mg by mouth every 6 (six) hours as needed for moderate pain.    Marland Kitchen albuterol (PROVENTIL HFA;VENTOLIN HFA) 108 (90 Base) MCG/ACT inhaler Inhale 2 puffs into the lungs every 6 (six) hours as needed for wheezing or shortness of  breath. 1 Inhaler 2  . ALPRAZolam (XANAX) 0.25 MG tablet Take 1 tablet (0.25 mg total) by mouth at bedtime as needed for anxiety. 30 tablet 0  . amoxicillin-clavulanate (AUGMENTIN) 875-125 MG tablet Take 1 tablet by mouth 2 (two) times daily. 14 tablet 0  . butalbital-acetaminophen-caffeine (FIORICET) 50-325-40 MG tablet Take 1-2 tablets by mouth every 6 (six) hours as needed for headache. 20 tablet 0  . cetirizine (ZYRTEC) 10 MG tablet Take 10 mg by mouth daily.    . chlorpheniramine-HYDROcodone (TUSSIONEX) 10-8 MG/5ML SUER Take 5 mLs by mouth every 12 (twelve) hours as needed for cough. 140 mL 0  . ciprofloxacin (CILOXAN) 0.3 % ophthalmic solution Place 2 drops into the left eye every 4 (four) hours while awake. Administer 1 drop, every 2 hours, while awake, for 2 days. Then 1 drop, every 4 hours, while awake, for the next 5 days. 5 mL 1  . fluticasone (FLOVENT HFA) 110 MCG/ACT inhaler Inhale 2 puffs into the lungs daily.     Marland Kitchen HYDROcodone-homatropine (HYCODAN) 5-1.5 MG/5ML syrup Take 5 mLs by mouth every 6 (six) hours as needed for cough. 120 mL 0  . levofloxacin (LEVAQUIN) 500 MG tablet Take 1 tablet (500 mg total) by mouth daily. 7 tablet 0  . lidocaine-prilocaine (EMLA) cream Apply to affected area once 30 g 3  . lisinopril-hydrochlorothiazide (ZESTORETIC) 20-12.5 MG tablet Take 1 tablet by mouth daily.    . mupirocin ointment (BACTROBAN) 2 % Place 1 application into the nose 2 (two) times daily. 22 g 0  . predniSONE (STERAPRED UNI-PAK 21 TAB) 10 MG (21) TBPK tablet Take as directed. 21 tablet 0  . prochlorperazine (COMPAZINE) 10 MG tablet Take 1 tablet (10 mg total) by mouth every 6 (six) hours as needed (Nausea or vomiting). 60 tablet 2   No current facility-administered medications for this visit.   Facility-Administered Medications Ordered in Other Visits  Medication Dose Route Frequency Provider Last Rate Last Admin  . sodium chloride flush (NS) 0.9 % injection 10 mL  10 mL Intravenous  PRN Lloyd Huger, MD   10 mL at 06/01/20 0905    OBJECTIVE: Vitals:   06/08/20 1109  BP: 102/60  Pulse: (!) 124  Temp: (!) 97.3 F (36.3 C)  SpO2: 100%     Body mass index is 15.85 kg/m.    ECOG FS:0 - Asymptomatic  General: Well-developed, well-nourished, no acute distress. Eyes: Pink conjunctiva, anicteric sclera. HEENT: Normocephalic, moist mucous membranes. Lungs: No audible wheezing or coughing. Heart: Regular rate and rhythm. Abdomen: Soft, nontender, no obvious distention. Musculoskeletal: No edema, cyanosis, or clubbing. Neuro: Alert, answering all questions appropriately. Cranial nerves grossly intact. Skin: No rashes or petechiae noted. Psych: Normal affect.   LAB RESULTS:  Lab Results  Component Value Date   NA 136 06/08/2020   K 4.3 06/08/2020   CL 102 06/08/2020   CO2 20 (L) 06/08/2020   GLUCOSE 113 (H) 06/08/2020   BUN 51 (H)  06/08/2020   CREATININE 1.74 (H) 06/08/2020   CALCIUM 9.0 06/08/2020   PROT 7.6 06/08/2020   ALBUMIN 4.0 06/08/2020   AST 16 06/08/2020   ALT 14 06/08/2020   ALKPHOS 89 06/08/2020   BILITOT 0.7 06/08/2020   GFRNONAA 33 (L) 06/08/2020   GFRAA 38 (L) 06/08/2020    Lab Results  Component Value Date   WBC 17.5 (H) 06/08/2020   NEUTROABS 13.2 (H) 06/08/2020   HGB 12.8 06/08/2020   HCT 37.5 06/08/2020   MCV 97.9 06/08/2020   PLT 470 (H) 06/08/2020     STUDIES: DG Chest 2 View  Result Date: 05/31/2020 CLINICAL DATA:  Cough, congestion EXAM: CHEST - 2 VIEW COMPARISON:  02/04/2020 FINDINGS: The heart size and mediastinal contours are within normal limits. Right chest port catheter. Both lungs are clear. The visualized skeletal structures are unremarkable. IMPRESSION: No acute abnormality of the lungs. Electronically Signed   By: Eddie Candle M.D.   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  PLAN:    1.  Pathologic stage IB  triple negative invasive carcinoma of the upper inner quadrant of left breast: Patient's initial lumpectomy was on February 04, 2020.  Because of positive margins she underwent reexcision on February 24, 2020 with clear margins.  She has now had a port placed as well.  MUGA scan from March 22, 2020 reported an EF of 72%. Given the triple negative status of her disease, she will receive adjuvant chemotherapy using Adriamycin and Cytoxan with Udenyca support followed by weekly Taxol x12.  Patient will also require adjuvant XRT at the conclusion of her chemotherapy.  An aromatase inhibitor would not offer any benefit given the triple negative status of her disease.  Genetics referral has been placed.  Patient completed 4 cycles of Adriamycin and Cytoxan on May 04, 2020.  Proceed with cycle 2 of 12 of Taxol today.  Return to clinic in 1 week for further evaluation and consideration of cycle 3.  Patient will also receive 1 L of IV fluids with the remainder of her treatments. 2.  Anxiety/insomnia: Continue Xanax 0.5 mg at night as needed. 3.  Migraines: Significantly improved.  Continue Fioricet as needed. 4.  Depression: Patient declined initiation of antidepressant medication or referral for counseling. 5.  Cough: Nearly resolved.  Chest x-ray reviewed independently with no obvious pathology.  Patient has been instructed to continue her her inhalers and steroid prescription as prescribed.  She has completed antibiotics. 6.  Anemia: Resolved. 7.  Renal insufficiency: Patient's creatinine has trended up to 1.74.  IV fluids as above. 8.  Tachycardia: IV fluids as above.  Patient expressed understanding and was in agreement with this plan. She also understands that She can call clinic at any time with any questions, concerns, or complaints.   Cancer Staging Carcinoma of upper-inner quadrant of left female breast [facility]) Staging form: Breast, AJCC 8th Edition - Clinical stage from 02/01/2020: Stage IB (cT1c, cN0,  cM0, G3, ER-, PR-, HER2-) - Signed by Lloyd Huger, MD on 02/01/2020   Lloyd Huger, MD   06/09/2020 7:12 AM

## 2020-06-02 NOTE — Progress Notes (Signed)
PSN called patient today, leaving her a voice mail message to call back to discuss her concerns.

## 2020-06-07 ENCOUNTER — Encounter: Payer: Self-pay | Admitting: Oncology

## 2020-06-07 NOTE — Progress Notes (Signed)
Patient prescreened for appointment. Patient has no concerns or questions.  

## 2020-06-08 ENCOUNTER — Inpatient Hospital Stay: Payer: Medicaid Other

## 2020-06-08 ENCOUNTER — Inpatient Hospital Stay (HOSPITAL_BASED_OUTPATIENT_CLINIC_OR_DEPARTMENT_OTHER): Payer: Medicaid Other | Admitting: Oncology

## 2020-06-08 ENCOUNTER — Inpatient Hospital Stay: Payer: Medicaid Other | Attending: Oncology

## 2020-06-08 ENCOUNTER — Other Ambulatory Visit: Payer: Self-pay

## 2020-06-08 VITALS — BP 102/60 | HR 124 | Temp 97.3°F | Wt 96.7 lb

## 2020-06-08 DIAGNOSIS — Z5111 Encounter for antineoplastic chemotherapy: Secondary | ICD-10-CM | POA: Insufficient documentation

## 2020-06-08 DIAGNOSIS — Z171 Estrogen receptor negative status [ER-]: Secondary | ICD-10-CM | POA: Diagnosis not present

## 2020-06-08 DIAGNOSIS — C50212 Malignant neoplasm of upper-inner quadrant of left female breast: Secondary | ICD-10-CM

## 2020-06-08 LAB — COMPREHENSIVE METABOLIC PANEL
ALT: 14 U/L (ref 0–44)
AST: 16 U/L (ref 15–41)
Albumin: 4 g/dL (ref 3.5–5.0)
Alkaline Phosphatase: 89 U/L (ref 38–126)
Anion gap: 14 (ref 5–15)
BUN: 51 mg/dL — ABNORMAL HIGH (ref 6–20)
CO2: 20 mmol/L — ABNORMAL LOW (ref 22–32)
Calcium: 9 mg/dL (ref 8.9–10.3)
Chloride: 102 mmol/L (ref 98–111)
Creatinine, Ser: 1.74 mg/dL — ABNORMAL HIGH (ref 0.44–1.00)
GFR calc Af Amer: 38 mL/min — ABNORMAL LOW (ref >60)
GFR calc non Af Amer: 33 mL/min — ABNORMAL LOW (ref >60)
Glucose, Bld: 113 mg/dL — ABNORMAL HIGH (ref 70–99)
Potassium: 4.3 mmol/L (ref 3.5–5.1)
Sodium: 136 mmol/L (ref 135–145)
Total Bilirubin: 0.7 mg/dL (ref 0.3–1.2)
Total Protein: 7.6 g/dL (ref 6.5–8.1)

## 2020-06-08 LAB — CBC WITH DIFFERENTIAL/PLATELET
Abs Immature Granulocytes: 0.27 10*3/uL — ABNORMAL HIGH (ref 0.00–0.07)
Basophils Absolute: 0.2 10*3/uL — ABNORMAL HIGH (ref 0.0–0.1)
Basophils Relative: 1 %
Eosinophils Absolute: 0.3 10*3/uL (ref 0.0–0.5)
Eosinophils Relative: 1 %
HCT: 37.5 % (ref 36.0–46.0)
Hemoglobin: 12.8 g/dL (ref 12.0–15.0)
Immature Granulocytes: 2 %
Lymphocytes Relative: 10 %
Lymphs Abs: 1.8 10*3/uL (ref 0.7–4.0)
MCH: 33.4 pg (ref 26.0–34.0)
MCHC: 34.1 g/dL (ref 30.0–36.0)
MCV: 97.9 fL (ref 80.0–100.0)
Monocytes Absolute: 1.9 10*3/uL — ABNORMAL HIGH (ref 0.1–1.0)
Monocytes Relative: 11 %
Neutro Abs: 13.2 10*3/uL — ABNORMAL HIGH (ref 1.7–7.7)
Neutrophils Relative %: 75 %
Platelets: 470 10*3/uL — ABNORMAL HIGH (ref 150–400)
RBC: 3.83 MIL/uL — ABNORMAL LOW (ref 3.87–5.11)
RDW: 17.4 % — ABNORMAL HIGH (ref 11.5–15.5)
WBC: 17.5 10*3/uL — ABNORMAL HIGH (ref 4.0–10.5)
nRBC: 0 % (ref 0.0–0.2)

## 2020-06-08 MED ORDER — SODIUM CHLORIDE 0.9% FLUSH
10.0000 mL | INTRAVENOUS | Status: DC | PRN
  Administered 2020-06-08: 10 mL via INTRAVENOUS
  Filled 2020-06-08: qty 10

## 2020-06-08 MED ORDER — SODIUM CHLORIDE 0.9 % IV SOLN
80.0000 mg/m2 | Freq: Once | INTRAVENOUS | Status: AC
  Administered 2020-06-08: 120 mg via INTRAVENOUS
  Filled 2020-06-08: qty 20

## 2020-06-08 MED ORDER — DIPHENHYDRAMINE HCL 50 MG/ML IJ SOLN
25.0000 mg | Freq: Once | INTRAMUSCULAR | Status: AC
  Administered 2020-06-08: 25 mg via INTRAVENOUS
  Filled 2020-06-08: qty 1

## 2020-06-08 MED ORDER — SODIUM CHLORIDE 0.9 % IV SOLN
Freq: Once | INTRAVENOUS | Status: AC
  Filled 2020-06-08: qty 250

## 2020-06-08 MED ORDER — HEPARIN SOD (PORK) LOCK FLUSH 100 UNIT/ML IV SOLN
500.0000 [IU] | Freq: Once | INTRAVENOUS | Status: DC | PRN
  Filled 2020-06-08: qty 5

## 2020-06-08 MED ORDER — HEPARIN SOD (PORK) LOCK FLUSH 100 UNIT/ML IV SOLN
500.0000 [IU] | Freq: Once | INTRAVENOUS | Status: AC
  Administered 2020-06-08: 500 [IU] via INTRAVENOUS
  Filled 2020-06-08: qty 5

## 2020-06-08 MED ORDER — SODIUM CHLORIDE 0.9 % IV SOLN
10.0000 mg | Freq: Once | INTRAVENOUS | Status: AC
  Administered 2020-06-08: 10 mg via INTRAVENOUS
  Filled 2020-06-08: qty 10

## 2020-06-08 MED ORDER — FAMOTIDINE IN NACL 20-0.9 MG/50ML-% IV SOLN
20.0000 mg | Freq: Once | INTRAVENOUS | Status: AC
  Administered 2020-06-08: 20 mg via INTRAVENOUS
  Filled 2020-06-08: qty 50

## 2020-06-08 NOTE — Progress Notes (Signed)
Creatine 1.74, HR 124. Per Dr. [name] okay to proceed with Taxol treatment as scheduled, pt to also receive 1 Liter NS over 1 hour.

## 2020-06-11 NOTE — Progress Notes (Signed)
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  INTERVAL HISTORY: Patient returns to clinic today for further evaluation and consideration of cycle 3 of 12 of weekly Taxol.  Since receiving IV fluids with her treatments, she is tolerating them significantly better and is nearly back to her baseline.  She continues to have chronic cough.  Her headache has resolved.  She has no other neurologic complaints.  She has a fair appetite.  She has no chest pain, shortness of breath, or hemoptysis.  She denies any nausea, vomiting, constipation, or diarrhea.  She has no urinary complaints.  Patient offers no further specific complaints today.  REVIEW OF SYSTEMS:   Review of Systems  Constitutional: Negative.  Negative for fever, malaise/fatigue and weight loss.  HENT: Negative for congestion.   Respiratory: Positive for cough. Negative for hemoptysis and shortness of breath.   Cardiovascular: Negative.  Negative for chest pain and leg swelling.  Gastrointestinal: Negative.  Negative for abdominal pain and nausea.  Genitourinary: Negative.  Negative for dysuria.  Musculoskeletal: Negative.  Negative for back pain.  Skin: Negative.  Negative for rash.  Neurological: Negative.  Negative for dizziness, focal weakness, weakness and headaches.  Psychiatric/Behavioral: Negative.  The patient is not nervous/anxious and does not have insomnia.     As per HPI. Otherwise, a complete review of systems is negative.  PAST MEDICAL HISTORY: Past Medical History:  Diagnosis Date   . Arthritis   . Cellulitis   . COPD (chronic obstructive pulmonary disease) ([place])   . Depression   . History of kidney stones   . History of tracheostomy 2000   stated that epiglottis swelled up for an unknown reason; had trach for about a week.   . Hypertension   . Pneumonia     PAST SURGICAL HISTORY: Past Surgical History:  Procedure Laterality Date  . arm surgery  1984   fractured ulna  . BREAST LUMPECTOMY WITH SENTINEL LYMPH NODE BIOPSY Left 02/04/2020   Procedure: BREAST LUMPECTOMY WITH SENTINEL LYMPH NODE BX;  Surgeon: Jules Husbands, MD;  Location: [facility] ORS;  Service: General;  Laterality: Left;  . CLEFT LIP REPAIR    . FRACTURE SURGERY    . kidney stones    . lymphnode neck    . PORTACATH PLACEMENT N/A 02/04/2020   Procedure: INSERTION PORT-A-CATH;  Surgeon: Jules Husbands, MD;  Location: [facility] ORS;  Service: General;  Laterality: N/A;  . RE-EXCISION OF BREAST LUMPECTOMY Left 02/24/2020   Procedure: RE-EXCISION OF Left BREAST LUMPECTOMY;  Surgeon: Jules Husbands, MD;  Location: [facility] ORS;  Service: General;  Laterality: Left;  . TYMPANOSTOMY TUBE PLACEMENT      FAMILY HISTORY: Family History  Problem Relation Age of Onset  . COPD Other   . COPD Mother   . Hypertension Mother   . COPD Father   . Breast cancer Neg Hx     ADVANCED DIRECTIVES (Y/N):  N  HEALTH MAINTENANCE: Social History   Tobacco Use  . Smoking status: Current Every Day Smoker    Packs/day: 1.00    Types: Cigarettes  . Smokeless  tobacco: Never Used  Vaping Use  . Vaping Use: Never used  Substance Use Topics  . Alcohol use: No  . Drug use: No     Colonoscopy:  PAP:  Bone density:  Lipid panel:  Allergies  Allergen Reactions  . Sulfa Antibiotics Nausea And Vomiting    Current Outpatient Medications  Medication Sig Dispense Refill  . acetaminophen (TYLENOL) 325 MG tablet Take 650 mg by mouth every 6 (six) hours as needed for moderate pain.    Marland Kitchen albuterol (PROVENTIL HFA;VENTOLIN HFA)  108 (90 Base) MCG/ACT inhaler Inhale 2 puffs into the lungs every 6 (six) hours as needed for wheezing or shortness of breath. 1 Inhaler 2  . amoxicillin-clavulanate (AUGMENTIN) 875-125 MG tablet Take 1 tablet by mouth 2 (two) times daily. 14 tablet 0  . butalbital-acetaminophen-caffeine (FIORICET) 50-325-40 MG tablet Take 1-2 tablets by mouth every 6 (six) hours as needed for headache. 20 tablet 0  . cetirizine (ZYRTEC) 10 MG tablet Take 10 mg by mouth daily.    . chlorpheniramine-HYDROcodone (TUSSIONEX) 10-8 MG/5ML SUER Take 5 mLs by mouth every 12 (twelve) hours as needed for cough. 140 mL 0  . ciprofloxacin (CILOXAN) 0.3 % ophthalmic solution Place 2 drops into the left eye every 4 (four) hours while awake. Administer 1 drop, every 2 hours, while awake, for 2 days. Then 1 drop, every 4 hours, while awake, for the next 5 days. 5 mL 1  . fluticasone (FLOVENT HFA) 110 MCG/ACT inhaler Inhale 2 puffs into the lungs daily.     Marland Kitchen HYDROcodone-homatropine (HYCODAN) 5-1.5 MG/5ML syrup Take 5 mLs by mouth every 6 (six) hours as needed for cough. 120 mL 0  . levofloxacin (LEVAQUIN) 500 MG tablet Take 1 tablet (500 mg total) by mouth daily. 7 tablet 0  . lidocaine-prilocaine (EMLA) cream Apply to affected area once 30 g 3  . lisinopril-hydrochlorothiazide (ZESTORETIC) 20-12.5 MG tablet Take 1 tablet by mouth daily.    . mupirocin ointment (BACTROBAN) 2 % Place 1 application into the nose 2 (two) times daily. 22 g 0  . predniSONE (STERAPRED UNI-PAK 21 TAB) 10 MG (21) TBPK tablet Take as directed. 21 tablet 0  . prochlorperazine (COMPAZINE) 10 MG tablet Take 1 tablet (10 mg total) by mouth every 6 (six) hours as needed (Nausea or vomiting). 60 tablet 2  . ALPRAZolam (XANAX) 0.25 MG tablet Take 1 tablet (0.25 mg total) by mouth at bedtime as needed for anxiety. 30 tablet 0   No current facility-administered medications for this visit.   Facility-Administered Medications Ordered in Other Visits  Medication  Dose Route Frequency Provider Last Rate Last Admin  . sodium chloride flush (NS) 0.9 % injection 10 mL  10 mL Intravenous PRN Lloyd Huger, MD   10 mL at 06/01/20 0905  . sodium chloride flush (NS) 0.9 % injection 10 mL  10 mL Intravenous PRN Lloyd Huger, MD   10 mL at 06/15/20 0829    OBJECTIVE: Vitals:   06/15/20 0856  BP: (!) 138/97  Pulse: 78  Resp: 18  Temp: (!) 96.8 F (36 C)  SpO2: 97%     Body mass index is 16.45 kg/m.    ECOG FS:0 - Asymptomatic  General: Well-developed, well-nourished, no acute distress. Eyes: Pink conjunctiva, anicteric sclera. HEENT: Normocephalic, moist mucous membranes. Lungs: No audible wheezing or coughing. Heart: Regular rate and rhythm. Abdomen: Soft, nontender, no obvious distention. Musculoskeletal: No edema, cyanosis, or clubbing. Neuro: Alert, answering all questions appropriately. Cranial nerves grossly  intact. Skin: No rashes or petechiae noted. Psych: Normal affect.    LAB RESULTS:  Lab Results  Component Value Date   NA 140 06/15/2020   K 4.7 06/15/2020   CL 106 06/15/2020   CO2 27 06/15/2020   GLUCOSE 93 06/15/2020   BUN 29 (H) 06/15/2020   CREATININE 1.17 (H) 06/15/2020   CALCIUM 8.4 (L) 06/15/2020   PROT 6.1 (L) 06/15/2020   ALBUMIN 3.3 (L) 06/15/2020   AST 17 06/15/2020   ALT 12 06/15/2020   ALKPHOS 66 06/15/2020   BILITOT 0.4 06/15/2020   GFRNONAA 53 (L) 06/15/2020   GFRAA >60 06/15/2020    Lab Results  Component Value Date   WBC 7.2 06/15/2020   NEUTROABS 3.9 06/15/2020   HGB 10.7 (L) 06/15/2020   HCT 32.1 (L) 06/15/2020   MCV 99.7 06/15/2020   PLT 266 06/15/2020     STUDIES: DG Chest 2 View  Result Date: 05/31/2020 CLINICAL DATA:  Cough, congestion EXAM: CHEST - 2 VIEW COMPARISON:  02/04/2020 FINDINGS: The heart size and mediastinal contours are within normal limits. Right chest port catheter. Both lungs are clear. The visualized skeletal structures are unremarkable. IMPRESSION: No acute  abnormality of the lungs. Electronically Signed   By: Eddie Candle M.D.   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  PLAN:    1.  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  Because of positive margins she underwent reexcision on February 24, 2020 with clear margins.  She has now had a port placed as well.  MUGA scan from March 22, 2020 reported an EF of 72%. Given the triple negative status of her disease, she will receive adjuvant chemotherapy using Adriamycin and Cytoxan with Udenyca support followed by weekly Taxol x12.  Patient will also require adjuvant XRT at the conclusion of her chemotherapy.  An aromatase inhibitor would not offer any benefit given the triple negative status of her disease.  Genetics referral has been placed.  Patient completed 4 cycles of Adriamycin and Cytoxan on May 04, 2020.  Proceed with cycle 3 of 12 of Taxol today.  Return to clinic in 1 week for treatment only, in 2 weeks for further evaluation by nurse practitioner and consideration of cycle 4.  Patient will then return to clinic in 3 weeks for further evaluation and consideration of cycle 5.  Patient receives 1 L of IV fluids with the remainder of her treatments. 2.  Anxiety/insomnia: Continue Xanax 0.5 mg at night as needed.  Patient was given a refill today. 3.  Migraines: Resolved.  Continue Fioricet as needed. 4.  Depression: Patient declined initiation of antidepressant medication or referral for counseling. 5.  Cough: Nearly resolved.  Chest x-ray reviewed independently with no obvious pathology.  Patient has been instructed to continue her her inhalers and steroid prescription as prescribed.  She has completed antibiotics. 6.  Anemia: Hemoglobin has trended down to 10.7, monitor. 7.  Renal insufficiency: Improved  with IV fluids.  Patient's creatinine 1.17.  IV fluids with weekly treatments as above. 8.  Tachycardia: IV fluids as above.  Proceed with treatment as scheduled.  Patient expressed understanding and was in agreement with this plan. She also understands that She can call clinic at any time with any questions, concerns, or complaints.   Cancer Staging Carcinoma of  upper-inner quadrant of left female breast [facility]) Staging form: Breast, AJCC 8th Edition - Clinical stage from 02/01/2020: Stage IB (cT1c, cN0, cM0, G3, ER-, PR-, HER2-) - Signed by Lloyd Huger, MD on 02/01/2020   Lloyd Huger, MD   06/15/2020 5:08 PM

## 2020-06-15 ENCOUNTER — Other Ambulatory Visit: Payer: Self-pay

## 2020-06-15 ENCOUNTER — Inpatient Hospital Stay (HOSPITAL_BASED_OUTPATIENT_CLINIC_OR_DEPARTMENT_OTHER): Payer: Medicaid Other | Admitting: Oncology

## 2020-06-15 ENCOUNTER — Inpatient Hospital Stay: Payer: Medicaid Other

## 2020-06-15 ENCOUNTER — Encounter: Payer: Self-pay | Admitting: Dietician

## 2020-06-15 ENCOUNTER — Encounter: Payer: Self-pay | Admitting: Oncology

## 2020-06-15 VITALS — BP 138/97 | HR 78 | Temp 96.8°F | Resp 18 | Wt 100.4 lb

## 2020-06-15 VITALS — BP 106/87 | HR 83 | Resp 18

## 2020-06-15 DIAGNOSIS — Z5111 Encounter for antineoplastic chemotherapy: Secondary | ICD-10-CM | POA: Diagnosis not present

## 2020-06-15 DIAGNOSIS — Z171 Estrogen receptor negative status [ER-]: Secondary | ICD-10-CM | POA: Diagnosis not present

## 2020-06-15 DIAGNOSIS — Z95828 Presence of other vascular implants and grafts: Secondary | ICD-10-CM

## 2020-06-15 DIAGNOSIS — C50212 Malignant neoplasm of upper-inner quadrant of left female breast: Secondary | ICD-10-CM

## 2020-06-15 LAB — CBC WITH DIFFERENTIAL/PLATELET
Abs Immature Granulocytes: 0.08 10*3/uL — ABNORMAL HIGH (ref 0.00–0.07)
Basophils Absolute: 0.2 10*3/uL — ABNORMAL HIGH (ref 0.0–0.1)
Basophils Relative: 2 %
Eosinophils Absolute: 0.6 10*3/uL — ABNORMAL HIGH (ref 0.0–0.5)
Eosinophils Relative: 8 %
HCT: 32.1 % — ABNORMAL LOW (ref 36.0–46.0)
Hemoglobin: 10.7 g/dL — ABNORMAL LOW (ref 12.0–15.0)
Immature Granulocytes: 1 %
Lymphocytes Relative: 28 %
Lymphs Abs: 2 10*3/uL (ref 0.7–4.0)
MCH: 33.2 pg (ref 26.0–34.0)
MCHC: 33.3 g/dL (ref 30.0–36.0)
MCV: 99.7 fL (ref 80.0–100.0)
Monocytes Absolute: 0.4 10*3/uL (ref 0.1–1.0)
Monocytes Relative: 6 %
Neutro Abs: 3.9 10*3/uL (ref 1.7–7.7)
Neutrophils Relative %: 55 %
Platelets: 266 10*3/uL (ref 150–400)
RBC: 3.22 MIL/uL — ABNORMAL LOW (ref 3.87–5.11)
RDW: 16.3 % — ABNORMAL HIGH (ref 11.5–15.5)
WBC: 7.2 10*3/uL (ref 4.0–10.5)
nRBC: 0 % (ref 0.0–0.2)

## 2020-06-15 LAB — COMPREHENSIVE METABOLIC PANEL
ALT: 12 U/L (ref 0–44)
AST: 17 U/L (ref 15–41)
Albumin: 3.3 g/dL — ABNORMAL LOW (ref 3.5–5.0)
Alkaline Phosphatase: 66 U/L (ref 38–126)
Anion gap: 7 (ref 5–15)
BUN: 29 mg/dL — ABNORMAL HIGH (ref 6–20)
CO2: 27 mmol/L (ref 22–32)
Calcium: 8.4 mg/dL — ABNORMAL LOW (ref 8.9–10.3)
Chloride: 106 mmol/L (ref 98–111)
Creatinine, Ser: 1.17 mg/dL — ABNORMAL HIGH (ref 0.44–1.00)
GFR calc Af Amer: >60 mL/min (ref >60)
GFR calc non Af Amer: 53 mL/min — ABNORMAL LOW (ref >60)
Glucose, Bld: 93 mg/dL (ref 70–99)
Potassium: 4.7 mmol/L (ref 3.5–5.1)
Sodium: 140 mmol/L (ref 135–145)
Total Bilirubin: 0.4 mg/dL (ref 0.3–1.2)
Total Protein: 6.1 g/dL — ABNORMAL LOW (ref 6.5–8.1)

## 2020-06-15 MED ORDER — SODIUM CHLORIDE 0.9 % IV SOLN
10.0000 mg | Freq: Once | INTRAVENOUS | Status: AC
  Administered 2020-06-15: 10 mg via INTRAVENOUS
  Filled 2020-06-15: qty 10

## 2020-06-15 MED ORDER — ALPRAZOLAM 0.25 MG PO TABS: 0.2500 mg | ORAL_TABLET | Freq: Every evening | ORAL | 0 refills | Status: DC | PRN

## 2020-06-15 MED ORDER — SODIUM CHLORIDE 0.9 % IV SOLN
Freq: Once | INTRAVENOUS | Status: AC
  Administered 2020-06-15: 1000 mL via INTRAVENOUS
  Filled 2020-06-15: qty 250

## 2020-06-15 MED ORDER — SODIUM CHLORIDE 0.9% FLUSH
10.0000 mL | INTRAVENOUS | Status: DC | PRN
  Administered 2020-06-15: 10 mL via INTRAVENOUS
  Filled 2020-06-15: qty 10

## 2020-06-15 MED ORDER — DIPHENHYDRAMINE HCL 50 MG/ML IJ SOLN
25.0000 mg | Freq: Once | INTRAMUSCULAR | Status: AC
  Administered 2020-06-15: 25 mg via INTRAVENOUS
  Filled 2020-06-15: qty 1

## 2020-06-15 MED ORDER — HEPARIN SOD (PORK) LOCK FLUSH 100 UNIT/ML IV SOLN
INTRAVENOUS | Status: AC
  Filled 2020-06-15: qty 5

## 2020-06-15 MED ORDER — SODIUM CHLORIDE 0.9 % IV SOLN
80.0000 mg/m2 | Freq: Once | INTRAVENOUS | Status: AC
  Administered 2020-06-15: 120 mg via INTRAVENOUS
  Filled 2020-06-15: qty 20

## 2020-06-15 MED ORDER — SODIUM CHLORIDE 0.9 % IV SOLN
Freq: Once | INTRAVENOUS | Status: AC
  Filled 2020-06-15: qty 250

## 2020-06-15 MED ORDER — HEPARIN SOD (PORK) LOCK FLUSH 100 UNIT/ML IV SOLN
500.0000 [IU] | Freq: Once | INTRAVENOUS | Status: AC | PRN
  Administered 2020-06-15: 500 [IU]
  Filled 2020-06-15: qty 5

## 2020-06-15 MED ORDER — FAMOTIDINE IN NACL 20-0.9 MG/50ML-% IV SOLN
20.0000 mg | Freq: Once | INTRAVENOUS | Status: AC
  Administered 2020-06-15: 20 mg via INTRAVENOUS
  Filled 2020-06-15: qty 50

## 2020-06-15 NOTE — Progress Notes (Signed)
Nutrition Assessment  Reason for Assessment: Consult   ASSESSMENT: 54 year old female with IB triple negative carcinoma of upper-inner quadrant of left breast, followed by Dr. [name]. She is s/p lumpectomy with sentinel LN biopsy on 02/04/20 and re-excision on 02/24/20. Patient receiving adjuvant chemotherapy using Adriamycin/Cytoxan followed by weekly Taxol. Patient will require  adjuvant RXT at conclusion of chemotherapy. PMHx: COPD, HTN  Patient presents today for cycle 2 of 12 of Taxol. Met briefly with patient in exam room prior to infusion. She reports appetite is good, she wakes up hungry recalls eating a few pieces of Honey Baked ham for breakfast this morning. Patient reports eating 4-5 times/day, types of food as well as meal intakes vary due to food insecurity. She recalls eating a 20 piece chicken nugget meal from [employer] the other day. Patient reports mild constipation, bowels move every other day. Discussed fluid intake, patient reports that she does not drink enough water, is working on increasing.   Nutrition Focused Physical Exam:  Limited exam, patient fully clothed in sweat pants, flannel shirt, knit cap Observed moderate depletions to orbital and buccal regions, severe clavicle depletion  Medications: Xanax, Compazine Labs: Cr 1.74 (H), Hgb 10.7 (L)   Anthropometrics: Weight today 100 lb 6.4 oz increased from 96 lb 11.2 oz on 7/01 6/24- 98 lb 3.2 oz 6/10- 104 lb 3.2 oz 5/13- 104 lb 14.4 oz 4/15- 105 lb 8 oz 3/15- 105 lb 2/22- 108 lb 12.8 oz   Height: 5' 5.5" Weight: 45.5 kg UBW: 110-115 lb per pt report BMI: 16.45 kg   Estimated Energy Needs  Kcals: 2549-8264 Protein: 80-91 Fluid: >1.7 L   NUTRITION DIAGNOSIS: Inadequate oral intake related to food insecurity, chronic illness (COPD), and cancer as evidenced by 9 lb (8.3%) wt loss in 18 weeks which is significant.   MALNUTRITION DIAGNOSIS: Likely, however unable to define at this time.  INTERVENTION:   Complimentary case of Ensure Enlive given today, encouraged trying to drink 2-3/day.  Provided bag of food from food pantry (canned tuna, canned chx, instant mashed pots, spaghetti noodles/sauce, stk and potato soup) Educated on importance of hydration Provided patient with preferred chocolate flavor Ensure during infusion  MONITORING, EVALUATION, GOAL:  Weight trends, intake  Next Visit: To be determined  Lajuan Lines, RD, LDN Clinical Nutrition After Hours/Weekend Pager # in [place]

## 2020-06-22 ENCOUNTER — Inpatient Hospital Stay: Payer: Medicaid Other

## 2020-06-22 ENCOUNTER — Other Ambulatory Visit: Payer: Self-pay

## 2020-06-22 VITALS — BP 104/69 | HR 72 | Temp 96.8°F | Resp 20 | Wt 100.0 lb

## 2020-06-22 DIAGNOSIS — Z95828 Presence of other vascular implants and grafts: Secondary | ICD-10-CM

## 2020-06-22 DIAGNOSIS — Z171 Estrogen receptor negative status [ER-]: Secondary | ICD-10-CM

## 2020-06-22 DIAGNOSIS — C50212 Malignant neoplasm of upper-inner quadrant of left female breast: Secondary | ICD-10-CM

## 2020-06-22 DIAGNOSIS — Z5111 Encounter for antineoplastic chemotherapy: Secondary | ICD-10-CM | POA: Diagnosis not present

## 2020-06-22 LAB — CBC WITH DIFFERENTIAL/PLATELET
Abs Immature Granulocytes: 0.06 10*3/uL (ref 0.00–0.07)
Basophils Absolute: 0.1 10*3/uL (ref 0.0–0.1)
Basophils Relative: 1 %
Eosinophils Absolute: 0.6 10*3/uL — ABNORMAL HIGH (ref 0.0–0.5)
Eosinophils Relative: 8 %
HCT: 35.1 % — ABNORMAL LOW (ref 36.0–46.0)
Hemoglobin: 11.6 g/dL — ABNORMAL LOW (ref 12.0–15.0)
Immature Granulocytes: 1 %
Lymphocytes Relative: 21 %
Lymphs Abs: 1.4 10*3/uL (ref 0.7–4.0)
MCH: 33.5 pg (ref 26.0–34.0)
MCHC: 33 g/dL (ref 30.0–36.0)
MCV: 101.4 fL — ABNORMAL HIGH (ref 80.0–100.0)
Monocytes Absolute: 0.4 10*3/uL (ref 0.1–1.0)
Monocytes Relative: 7 %
Neutro Abs: 4 10*3/uL (ref 1.7–7.7)
Neutrophils Relative %: 62 %
Platelets: 293 10*3/uL (ref 150–400)
RBC: 3.46 MIL/uL — ABNORMAL LOW (ref 3.87–5.11)
RDW: 16.3 % — ABNORMAL HIGH (ref 11.5–15.5)
WBC: 6.5 10*3/uL (ref 4.0–10.5)
nRBC: 0 % (ref 0.0–0.2)

## 2020-06-22 LAB — COMPREHENSIVE METABOLIC PANEL
ALT: 18 U/L (ref 0–44)
AST: 22 U/L (ref 15–41)
Albumin: 3.8 g/dL (ref 3.5–5.0)
Alkaline Phosphatase: 76 U/L (ref 38–126)
Anion gap: 9 (ref 5–15)
BUN: 35 mg/dL — ABNORMAL HIGH (ref 6–20)
CO2: 26 mmol/L (ref 22–32)
Calcium: 9.1 mg/dL (ref 8.9–10.3)
Chloride: 103 mmol/L (ref 98–111)
Creatinine, Ser: 0.89 mg/dL (ref 0.44–1.00)
GFR calc Af Amer: >60 mL/min (ref >60)
GFR calc non Af Amer: >60 mL/min (ref >60)
Glucose, Bld: 115 mg/dL — ABNORMAL HIGH (ref 70–99)
Potassium: 4.4 mmol/L (ref 3.5–5.1)
Sodium: 138 mmol/L (ref 135–145)
Total Bilirubin: 0.5 mg/dL (ref 0.3–1.2)
Total Protein: 7.1 g/dL (ref 6.5–8.1)

## 2020-06-22 MED ORDER — SODIUM CHLORIDE 0.9 % IV SOLN
80.0000 mg/m2 | Freq: Once | INTRAVENOUS | Status: AC
  Administered 2020-06-22: 120 mg via INTRAVENOUS
  Filled 2020-06-22: qty 20

## 2020-06-22 MED ORDER — SODIUM CHLORIDE 0.9% FLUSH
10.0000 mL | INTRAVENOUS | Status: DC | PRN
  Administered 2020-06-22: 10 mL via INTRAVENOUS
  Filled 2020-06-22: qty 10

## 2020-06-22 MED ORDER — HEPARIN SOD (PORK) LOCK FLUSH 100 UNIT/ML IV SOLN
500.0000 [IU] | Freq: Once | INTRAVENOUS | Status: AC | PRN
  Administered 2020-06-22: 500 [IU]
  Filled 2020-06-22: qty 5

## 2020-06-22 MED ORDER — SODIUM CHLORIDE 0.9 % IV SOLN
Freq: Once | INTRAVENOUS | Status: AC
  Filled 2020-06-22: qty 250

## 2020-06-22 MED ORDER — DIPHENHYDRAMINE HCL 50 MG/ML IJ SOLN
25.0000 mg | Freq: Once | INTRAMUSCULAR | Status: AC
  Administered 2020-06-22: 25 mg via INTRAVENOUS
  Filled 2020-06-22: qty 1

## 2020-06-22 MED ORDER — FAMOTIDINE IN NACL 20-0.9 MG/50ML-% IV SOLN
20.0000 mg | Freq: Once | INTRAVENOUS | Status: AC
  Administered 2020-06-22: 20 mg via INTRAVENOUS
  Filled 2020-06-22: qty 50

## 2020-06-22 MED ORDER — SODIUM CHLORIDE 0.9 % IV SOLN
10.0000 mg | Freq: Once | INTRAVENOUS | Status: AC
  Administered 2020-06-22: 10 mg via INTRAVENOUS
  Filled 2020-06-22: qty 1

## 2020-06-29 ENCOUNTER — Inpatient Hospital Stay: Payer: Medicaid Other

## 2020-06-29 ENCOUNTER — Other Ambulatory Visit: Payer: Self-pay

## 2020-06-29 ENCOUNTER — Encounter: Payer: Self-pay | Admitting: Nurse Practitioner

## 2020-06-29 ENCOUNTER — Inpatient Hospital Stay (HOSPITAL_BASED_OUTPATIENT_CLINIC_OR_DEPARTMENT_OTHER): Payer: Medicaid Other | Admitting: Nurse Practitioner

## 2020-06-29 DIAGNOSIS — Z5111 Encounter for antineoplastic chemotherapy: Secondary | ICD-10-CM | POA: Diagnosis not present

## 2020-06-29 DIAGNOSIS — Z171 Estrogen receptor negative status [ER-]: Secondary | ICD-10-CM

## 2020-06-29 DIAGNOSIS — G43909 Migraine, unspecified, not intractable, without status migrainosus: Secondary | ICD-10-CM | POA: Diagnosis not present

## 2020-06-29 DIAGNOSIS — Z95828 Presence of other vascular implants and grafts: Secondary | ICD-10-CM

## 2020-06-29 LAB — COMPREHENSIVE METABOLIC PANEL
ALT: 13 U/L (ref 0–44)
AST: 18 U/L (ref 15–41)
Albumin: 3.7 g/dL (ref 3.5–5.0)
Alkaline Phosphatase: 71 U/L (ref 38–126)
Anion gap: 10 (ref 5–15)
BUN: 22 mg/dL — ABNORMAL HIGH (ref 6–20)
CO2: 24 mmol/L (ref 22–32)
Calcium: 9 mg/dL (ref 8.9–10.3)
Chloride: 105 mmol/L (ref 98–111)
Creatinine, Ser: 0.78 mg/dL (ref 0.44–1.00)
GFR calc Af Amer: >60 mL/min (ref >60)
GFR calc non Af Amer: >60 mL/min (ref >60)
Glucose, Bld: 82 mg/dL (ref 70–99)
Potassium: 4 mmol/L (ref 3.5–5.1)
Sodium: 139 mmol/L (ref 135–145)
Total Bilirubin: 0.7 mg/dL (ref 0.3–1.2)
Total Protein: 6.8 g/dL (ref 6.5–8.1)

## 2020-06-29 LAB — CBC WITH DIFFERENTIAL/PLATELET
Abs Immature Granulocytes: 0.05 10*3/uL (ref 0.00–0.07)
Basophils Absolute: 0.1 10*3/uL (ref 0.0–0.1)
Basophils Relative: 1 %
Eosinophils Absolute: 0.6 10*3/uL — ABNORMAL HIGH (ref 0.0–0.5)
Eosinophils Relative: 8 %
HCT: 35.1 % — ABNORMAL LOW (ref 36.0–46.0)
Hemoglobin: 11.7 g/dL — ABNORMAL LOW (ref 12.0–15.0)
Immature Granulocytes: 1 %
Lymphocytes Relative: 26 %
Lymphs Abs: 1.8 10*3/uL (ref 0.7–4.0)
MCH: 34.2 pg — ABNORMAL HIGH (ref 26.0–34.0)
MCHC: 33.3 g/dL (ref 30.0–36.0)
MCV: 102.6 fL — ABNORMAL HIGH (ref 80.0–100.0)
Monocytes Absolute: 0.5 10*3/uL (ref 0.1–1.0)
Monocytes Relative: 6 %
Neutro Abs: 4.1 10*3/uL (ref 1.7–7.7)
Neutrophils Relative %: 58 %
Platelets: 338 10*3/uL (ref 150–400)
RBC: 3.42 MIL/uL — ABNORMAL LOW (ref 3.87–5.11)
RDW: 15.4 % (ref 11.5–15.5)
WBC: 7 10*3/uL (ref 4.0–10.5)
nRBC: 0 % (ref 0.0–0.2)

## 2020-06-29 MED ORDER — BUTALBITAL-APAP-CAFFEINE 50-325-40 MG PO TABS
1.0000 | ORAL_TABLET | Freq: Four times a day (QID) | ORAL | 0 refills | Status: DC | PRN
Start: 2020-06-29 — End: 2020-07-06

## 2020-06-29 MED ORDER — SODIUM CHLORIDE 0.9% FLUSH
10.0000 mL | INTRAVENOUS | Status: DC | PRN
  Administered 2020-06-29: 10 mL via INTRAVENOUS
  Filled 2020-06-29: qty 10

## 2020-06-29 MED ORDER — FAMOTIDINE IN NACL 20-0.9 MG/50ML-% IV SOLN
20.0000 mg | Freq: Once | INTRAVENOUS | Status: AC
  Administered 2020-06-29: 20 mg via INTRAVENOUS
  Filled 2020-06-29: qty 50

## 2020-06-29 MED ORDER — SODIUM CHLORIDE 0.9 % IV SOLN
10.0000 mg | Freq: Once | INTRAVENOUS | Status: AC
  Administered 2020-06-29: 10 mg via INTRAVENOUS
  Filled 2020-06-29: qty 10

## 2020-06-29 MED ORDER — DIPHENHYDRAMINE HCL 50 MG/ML IJ SOLN
25.0000 mg | Freq: Once | INTRAMUSCULAR | Status: AC
  Administered 2020-06-29: 25 mg via INTRAVENOUS
  Filled 2020-06-29: qty 1

## 2020-06-29 MED ORDER — SODIUM CHLORIDE 0.9 % IV SOLN
Freq: Once | INTRAVENOUS | Status: AC
  Filled 2020-06-29: qty 250

## 2020-06-29 MED ORDER — HEPARIN SOD (PORK) LOCK FLUSH 100 UNIT/ML IV SOLN
500.0000 [IU] | Freq: Once | INTRAVENOUS | Status: AC | PRN
  Administered 2020-06-29: 500 [IU]
  Filled 2020-06-29: qty 5

## 2020-06-29 MED ORDER — SODIUM CHLORIDE 0.9 % IV SOLN
80.0000 mg/m2 | Freq: Once | INTRAVENOUS | Status: AC
  Administered 2020-06-29: 120 mg via INTRAVENOUS
  Filled 2020-06-29: qty 20

## 2020-06-29 NOTE — Progress Notes (Signed)
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  INTERVAL HISTORY: Patient returns to clinic today for further evaluation and consideration of cycle 4 of 12 of weekly Taxol.  She continues to tolerate her treatments well since receiving IV fluids and says she is nearly at baseline.  Continues to have chronic cough and was previously treated with antibiotics and steroids and cough suppressants.  Has not been taking over-the-counter medications.  Has intermittent headaches and requests refill of Fioricet.  Appetite is good and she denies weight loss.  She denies chest pain, shortness of breath, hemoptysis.  She denies any nausea, vomiting, constipation, or diarrhea.  She has no urinary complaints.  She offers no further specific complaints today.  REVIEW OF SYSTEMS:   Review of Systems  Constitutional: Negative.  Negative for fever, malaise/fatigue and weight loss.  HENT: Negative for congestion.   Respiratory: Positive for cough. Negative for hemoptysis and shortness of breath.   Cardiovascular: Negative.  Negative for chest pain and leg swelling.  Gastrointestinal: Negative.  Negative for abdominal pain and nausea.  Genitourinary: Negative.  Negative for dysuria.  Musculoskeletal: Negative.  Negative for back pain.  Skin: Negative.  Negative for rash.  Neurological: Negative.  Negative for dizziness, focal weakness, weakness and headaches.  Psychiatric/Behavioral: Negative.  The patient is not nervous/anxious and does not have insomnia.   As per HPI.  Otherwise, a complete review of systems is negative.  PAST MEDICAL HISTORY: Past Medical History:  Diagnosis Date  . Arthritis   . Cellulitis   . COPD (chronic obstructive pulmonary disease) ([place])   . Depression   . History of kidney stones   . History of tracheostomy 2000   stated that epiglottis swelled up for an unknown reason; had trach for about a week.   . Hypertension   . Pneumonia     PAST SURGICAL HISTORY: Past Surgical History:  Procedure Laterality Date  . arm surgery  1984   fractured ulna  . BREAST LUMPECTOMY WITH SENTINEL LYMPH NODE BIOPSY Left 02/04/2020   Procedure: BREAST LUMPECTOMY WITH SENTINEL LYMPH NODE BX;  Surgeon: Jules Husbands, MD;  Location: [facility] ORS;  Service: General;  Laterality: Left;  . CLEFT LIP REPAIR    . FRACTURE SURGERY    . kidney stones    . lymphnode neck    . PORTACATH PLACEMENT N/A 02/04/2020   Procedure: INSERTION PORT-A-CATH;  Surgeon: Jules Husbands, MD;  Location: [facility] ORS;  Service: General;  Laterality: N/A;  . RE-EXCISION OF BREAST LUMPECTOMY Left 02/24/2020   Procedure: RE-EXCISION OF Left BREAST LUMPECTOMY;  Surgeon: Jules Husbands, MD;  Location: [facility] ORS;  Service: General;  Laterality: Left;  . TYMPANOSTOMY TUBE PLACEMENT      FAMILY HISTORY: Family History  Problem Relation Age of Onset  . COPD Other   . COPD Mother   . Hypertension Mother   . COPD Father   . Breast cancer Neg Hx     ADVANCED DIRECTIVES (Y/N):  N  HEALTH MAINTENANCE: Social History   Tobacco Use  . Smoking status: Current Every Day Smoker  Packs/day: 1.00    Types: Cigarettes  . Smokeless tobacco: Never Used  Vaping Use  . Vaping Use: Never used  Substance Use Topics  . Alcohol use: No  . Drug use: No    Colonoscopy:  PAP:  Bone density:  Lipid panel:  Allergies  Allergen Reactions  . Sulfa Antibiotics Nausea And Vomiting    Current Outpatient Medications  Medication Sig Dispense Refill  . acetaminophen (TYLENOL) 325 MG  tablet Take 650 mg by mouth every 6 (six) hours as needed for moderate pain.    Marland Kitchen albuterol (PROVENTIL HFA;VENTOLIN HFA) 108 (90 Base) MCG/ACT inhaler Inhale 2 puffs into the lungs every 6 (six) hours as needed for wheezing or shortness of breath. 1 Inhaler 2  . ALPRAZolam (XANAX) 0.25 MG tablet Take 1 tablet (0.25 mg total) by mouth at bedtime as needed for anxiety. 30 tablet 0  . butalbital-acetaminophen-caffeine (FIORICET) 50-325-40 MG tablet Take 1-2 tablets by mouth every 6 (six) hours as needed for headache. Do not exceed 6 tablets in 24 hour period. 20 tablet 0  . cetirizine (ZYRTEC) 10 MG tablet Take 10 mg by mouth daily.    . chlorpheniramine-HYDROcodone (TUSSIONEX) 10-8 MG/5ML SUER Take 5 mLs by mouth every 12 (twelve) hours as needed for cough. 140 mL 0  . fluticasone (FLOVENT HFA) 110 MCG/ACT inhaler Inhale 2 puffs into the lungs daily.     Marland Kitchen lidocaine-prilocaine (EMLA) cream Apply to affected area once 30 g 3  . mupirocin ointment (BACTROBAN) 2 % Place 1 application into the nose 2 (two) times daily. 22 g 0  . prochlorperazine (COMPAZINE) 10 MG tablet Take 1 tablet (10 mg total) by mouth every 6 (six) hours as needed (Nausea or vomiting). 60 tablet 2  . ciprofloxacin (CILOXAN) 0.3 % ophthalmic solution Place 2 drops into the left eye every 4 (four) hours while awake. Administer 1 drop, every 2 hours, while awake, for 2 days. Then 1 drop, every 4 hours, while awake, for the next 5 days. (Patient not taking: Reported on 06/29/2020) 5 mL 1   No current facility-administered medications for this visit.   Facility-Administered Medications Ordered in Other Visits  Medication Dose Route Frequency Provider Last Rate Last Admin  . sodium chloride flush (NS) 0.9 % injection 10 mL  10 mL Intravenous PRN Lloyd Huger, MD   10 mL at 06/01/20 0905    OBJECTIVE: Vitals:   06/29/20 0947  BP: 117/88  Pulse: 76  Resp: 18  Temp: (!) 96.9 F (36.1 C)  SpO2: 100%     Body mass index is  16.72 kg/m.    ECOG FS:0 - Asymptomatic  General: Well-developed, well-nourished, no acute distress. Eyes: Pink conjunctiva, anicteric sclera. Lungs: Strong, productive cough heard. No wheezing.  Heart: Regular rate and rhythm.  Abdomen: Soft, nontender, nondistended.  Musculoskeletal: No edema, cyanosis, or clubbing. Neuro: Alert, answering all questions appropriately. Cranial nerves grossly intact. Skin: No rashes or petechiae noted. Psych: Normal affect.   LAB RESULTS:  Lab Results  Component Value Date   NA 139 06/29/2020   K 4.0 06/29/2020   CL 105 06/29/2020   CO2 24 06/29/2020   GLUCOSE 82 06/29/2020   BUN 22 (H) 06/29/2020   CREATININE 0.78 06/29/2020   CALCIUM 9.0 06/29/2020   PROT 6.8 06/29/2020   ALBUMIN 3.7 06/29/2020   AST 18 06/29/2020   ALT 13 06/29/2020   ALKPHOS 71 06/29/2020   BILITOT 0.7 06/29/2020   GFRNONAA >60 06/29/2020   GFRAA >60  06/29/2020    Lab Results  Component Value Date   WBC 7.0 06/29/2020   NEUTROABS 4.1 06/29/2020   HGB 11.7 (L) 06/29/2020   HCT 35.1 (L) 06/29/2020   MCV 102.6 (H) 06/29/2020   PLT 338 06/29/2020    STUDIES: DG Chest 2 View  Result Date: 05/31/2020 CLINICAL DATA:  Cough, congestion EXAM: CHEST - 2 VIEW COMPARISON:  02/04/2020 FINDINGS: The heart size and mediastinal contours are within normal limits. Right chest port catheter. Both lungs are clear. The visualized skeletal structures are unremarkable. IMPRESSION: No acute abnormality of the lungs. Electronically Signed   By: Eddie Candle M.D.   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  PLAN:    1. 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  Because of positive margin she underwent reexcision on 02/24/2020 with clear margins.  Port was placed for administration of  chemotherapy.  MUGA from 03/22/2020 reported an EF of 72%.  Given the triple negative status of her disease, adjuvant chemotherapy using Adriamycin and Cytoxan with Udenyca support followed by weekly Taxol x 12 was recommended.  She will also require adjuvant XRT at the conclusion of her chemotherapy.  An aromatase inhibitor would not offer any benefit given the triple negative status of her disease.   Patient's initial lumpectomy was on February 04, 2020. Because of positive margins she underwent reexcision on February 24, 2020 with clear margins.  She has now had a port placed as well.  MUGA scan from March 22, 2020 reported an EF of 72%. Given the triple negative status of her disease, she will receive adjuvant chemotherapy using Adriamycin and Cytoxan with Udenyca support followed by weekly Taxol x12.  Patient will also require adjuvant XRT at the conclusion of her chemotherapy.  An aromatase inhibitor would not offer any benefit given the triple negative status of her disease.  Genetics referral has been placed.  Patient completed 4 cycles of Adriamycin and Cytoxan on May 04, 2020.  Proceed with cycle 5 of 12 of Taxol today.  Return to clinic in 1 week for labs and further evaluation and consideration of cycle 6 of weekly taxol with Dr. [name]. Patient receives 1 L of IV fluids with the remainder of her treatments. 2.  Anxiety/insomnia: Continue Xanax 0.5 mg at night as prescribed. 3.  Migraines/headaches: Persistent problem.  Suspect dehydration likely contributes.  Will refill Fioricet today. 4.  Depression: She does not complain of this today.  Previously declined antidepressant or referral for counseling.   5.  Cough: Persistent.  She is completed Levaquin and Augmentin as well as steroids.  No obvious pathology on previous chest x-ray.  Continue inhalers.  Discussed guaifenesin and dextromethorphan for symptoms today.   6.  Anemia: Hemoglobin has trended down to 11.7, monitor. 7.  Renal  insufficiency: Improved with IV fluids.  Patient's creatinine 0.78, improved.  IV fluids with weekly treatments as above. 8.  Tachycardia: IV fluids as above.  Proceed with treatment as scheduled. 9.  Weight loss-met with dietitian on 06/15/2020 for weights and trends.  Currently, weight is up.  Continue follow-up with dietitian as scheduled.   Patient expressed understanding and was in agreement with this plan. She also understands that She can call clinic at any time with any questions, concerns, or complaints.   Cancer Staging Carcinoma of upper-inner  quadrant of left female breast [facility]) Staging form: Breast, AJCC 8th Edition - Clinical stage from 02/01/2020: Stage IB (cT1c, cN0, cM0, G3, ER-, PR-, HER2-) - Signed by Lloyd Huger, MD on 02/01/2020   Verlon Au, NP   06/29/2020 4:10 PM

## 2020-06-30 ENCOUNTER — Encounter: Payer: Self-pay | Admitting: *Deleted

## 2020-07-03 NOTE — Progress Notes (Signed)
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  INTERVAL HISTORY: Patient returns to clinic today for further evaluation and consideration of cycle 6 of 12 of weekly Taxol.  She continues to have occasional migraine headaches.  She has chronic weakness and fatigue.  She continues to have chronic cough.  She has no other neurologic complaints.  She has a fair appetite.  She has no chest pain, shortness of breath, or hemoptysis.  She denies any nausea, vomiting, constipation, or diarrhea.  She has no urinary complaints.  Patient offers no further specific complaints today.  REVIEW OF SYSTEMS:   Review of Systems  Constitutional: Positive for malaise/fatigue. Negative for fever and weight loss.  HENT: Negative for congestion.   Respiratory: Positive for cough. Negative for hemoptysis and shortness of breath.   Cardiovascular: Negative.  Negative for chest pain and leg swelling.  Gastrointestinal: Negative.  Negative for abdominal pain and nausea.  Genitourinary: Negative.  Negative for dysuria.  Musculoskeletal: Negative.  Negative for back pain.  Skin: Negative.  Negative for rash.  Neurological: Positive for weakness and headaches. Negative for dizziness and focal weakness.  Psychiatric/Behavioral: Negative.  The patient is not nervous/anxious and does not have insomnia.     As per HPI. Otherwise, a complete review of systems is negative.  PAST MEDICAL HISTORY: Past Medical History:  Diagnosis  Date  . Arthritis   . Cellulitis   . COPD (chronic obstructive pulmonary disease) ([name])   . Depression   . History of kidney stones   . History of tracheostomy 2000   stated that epiglottis swelled up for an unknown reason; had trach for about a week.   . Hypertension   . Pneumonia     PAST SURGICAL HISTORY: Past Surgical History:  Procedure Laterality Date  . arm surgery  1984   fractured ulna  . BREAST LUMPECTOMY WITH SENTINEL LYMPH NODE BIOPSY Left 02/04/2020   Procedure: BREAST LUMPECTOMY WITH SENTINEL LYMPH NODE BX;  Surgeon: Jules Husbands, MD;  Location: [facility] ORS;  Service: General;  Laterality: Left;  . CLEFT LIP REPAIR    . FRACTURE SURGERY    . kidney stones    . lymphnode neck    . PORTACATH PLACEMENT N/A 02/04/2020   Procedure: INSERTION PORT-A-CATH;  Surgeon: Jules Husbands, MD;  Location: [facility] ORS;  Service: General;  Laterality: N/A;  . RE-EXCISION OF BREAST LUMPECTOMY Left 02/24/2020   Procedure: RE-EXCISION OF Left BREAST LUMPECTOMY;  Surgeon: Jules Husbands, MD;  Location: [facility] ORS;  Service: General;  Laterality: Left;  . TYMPANOSTOMY TUBE PLACEMENT      FAMILY HISTORY: Family History  Problem Relation Age of Onset  . COPD Other   . COPD Mother   . Hypertension Mother   . COPD Father   . Breast cancer Neg Hx     ADVANCED DIRECTIVES (Y/N):  N  HEALTH MAINTENANCE: Social History   Tobacco Use  . Smoking status: Current Every Day Smoker    Packs/day: 1.00    Types: Cigarettes  . Smokeless tobacco: Never  Used  Vaping Use  . Vaping Use: Never used  Substance Use Topics  . Alcohol use: No  . Drug use: No     Colonoscopy:  PAP:  Bone density:  Lipid panel:  Allergies  Allergen Reactions  . Sulfa Antibiotics Nausea And Vomiting    Current Outpatient Medications  Medication Sig Dispense Refill  . acetaminophen (TYLENOL) 325 MG tablet Take 650 mg by mouth every 6 (six) hours as needed for moderate pain.    Marland Kitchen albuterol (PROVENTIL HFA;VENTOLIN  HFA) 108 (90 Base) MCG/ACT inhaler Inhale 2 puffs into the lungs every 6 (six) hours as needed for wheezing or shortness of breath. 1 Inhaler 2  . cetirizine (ZYRTEC) 10 MG tablet Take 10 mg by mouth daily.    . chlorpheniramine-HYDROcodone (TUSSIONEX) 10-8 MG/5ML SUER Take 5 mLs by mouth every 12 (twelve) hours as needed for cough. 140 mL 0  . ciprofloxacin (CILOXAN) 0.3 % ophthalmic solution Place 2 drops into the left eye every 4 (four) hours while awake. Administer 1 drop, every 2 hours, while awake, for 2 days. Then 1 drop, every 4 hours, while awake, for the next 5 days. 5 mL 1  . fluticasone (FLOVENT HFA) 110 MCG/ACT inhaler Inhale 2 puffs into the lungs daily.     Marland Kitchen guaiFENesin (MUCINEX) 600 MG 12 hr tablet Take 600 mg by mouth 2 (two) times daily.    Marland Kitchen lidocaine-prilocaine (EMLA) cream Apply to affected area once 30 g 3  . mupirocin ointment (BACTROBAN) 2 % Place 1 application into the nose 2 (two) times daily. 22 g 0  . prochlorperazine (COMPAZINE) 10 MG tablet Take 1 tablet (10 mg total) by mouth every 6 (six) hours as needed (Nausea or vomiting). 60 tablet 2  . ALPRAZolam (XANAX) 0.5 MG tablet Take 1 tablet (0.5 mg total) by mouth at bedtime as needed for anxiety. 30 tablet 0  . butalbital-acetaminophen-caffeine (FIORICET) 50-325-40 MG tablet Take 1-2 tablets by mouth every 6 (six) hours as needed for headache. Do not exceed 6 tablets in 24 hour period. 20 tablet 0   No current facility-administered medications for this visit.   Facility-Administered Medications Ordered in Other Visits  Medication Dose Route Frequency Provider Last Rate Last Admin  . sodium chloride flush (NS) 0.9 % injection 10 mL  10 mL Intravenous PRN Lloyd Huger, MD   10 mL at 06/01/20 0905    OBJECTIVE: Vitals:   07/06/20 0903  BP: (!) 138/86  Pulse: 66  Temp: (!) 97.4 F (36.3 C)  SpO2: 100%     Body mass index is 16.57 kg/m.    ECOG FS:0 - Asymptomatic  General: Well-developed, well-nourished,  no acute distress. Eyes: Pink conjunctiva, anicteric sclera. HEENT: Normocephalic, moist mucous membranes. Lungs: No audible wheezing or coughing. Heart: Regular rate and rhythm. Abdomen: Soft, nontender, no obvious distention. Musculoskeletal: No edema, cyanosis, or clubbing. Neuro: Alert, answering all questions appropriately. Cranial nerves grossly intact. Skin: No rashes or petechiae noted. Psych: Normal affect.  LAB RESULTS:  Lab Results  Component Value Date   NA 138 07/06/2020   K 3.7 07/06/2020   CL 105 07/06/2020   CO2 23 07/06/2020   GLUCOSE 109 (H) 07/06/2020   BUN 25 (H) 07/06/2020   CREATININE 0.92 07/06/2020   CALCIUM 8.8 (L) 07/06/2020   PROT 6.2 (L) 07/06/2020   ALBUMIN 3.6 07/06/2020   AST 19 07/06/2020   ALT 14 07/06/2020   ALKPHOS 93 07/06/2020   BILITOT 0.5 07/06/2020   GFRNONAA >  60 07/06/2020   GFRAA >60 07/06/2020    Lab Results  Component Value Date   WBC 5.6 07/06/2020   NEUTROABS 3.3 07/06/2020   HGB 11.2 (L) 07/06/2020   HCT 34.6 (L) 07/06/2020   MCV 105.5 (H) 07/06/2020   PLT 282 07/06/2020     STUDIES: No results found.  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  PLAN:    1.  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  Because of positive margins she underwent reexcision on February 24, 2020 with clear margins.  She has now had a port placed as well.  MUGA scan from March 22, 2020 reported an EF of 72%. Given the triple negative status of her disease, she will receive adjuvant chemotherapy using Adriamycin and Cytoxan with Udenyca support followed by weekly Taxol x12.  Patient will also require adjuvant XRT at the conclusion of her chemotherapy.  An aromatase inhibitor would not offer any benefit given the triple negative status of her disease.  Genetics referral  has been placed.  Patient completed 4 cycles of Adriamycin and Cytoxan on May 04, 2020.  Proceed with cycle 6 of 12 of weekly Taxol today.  Return to clinic in 1 week for further evaluation and consideration of cycle 7.  Patient receives 1 L of IV fluids with the remainder of her treatments. 2.  Anxiety/insomnia: Continue Xanax 0.5 mg at night as needed.  Patient was given a refill today.  Patient expressed understanding given the conclusion of her treatments further management should be transitioned to her primary care physician. 3.  Migraines: Continue Fioricet as needed.  Patient was also given a referral to neurology for further evaluation. 4.  Depression: Improved.  Patient declined initiation of antidepressant medication or referral for counseling. 5.  Cough: Nearly resolved.  Previously, chest x-ray reviewed independently with no obvious pathology.  Patient has been instructed to continue her her inhalers and steroid prescription as prescribed.  She has completed antibiotics. 6.  Anemia: Chronic and unchanged.  Patient's hemoglobin is 11.2 today. 7.  Renal insufficiency: Resolved with weekly IV fluids.    Patient expressed understanding and was in agreement with this plan. She also understands that She can call clinic at any time with any questions, concerns, or complaints.   Cancer Staging Carcinoma of upper-inner quadrant of left female breast [facility]) Staging form: Breast, AJCC 8th Edition - Clinical stage from 02/01/2020: Stage IB (cT1c, cN0, cM0, G3, ER-, PR-, HER2-) - Signed by Lloyd Huger, MD on 02/01/2020   Lloyd Huger, MD   07/08/2020 7:47 AM

## 2020-07-05 ENCOUNTER — Encounter: Payer: Self-pay | Admitting: Oncology

## 2020-07-05 ENCOUNTER — Other Ambulatory Visit: Payer: Self-pay

## 2020-07-05 DIAGNOSIS — C50212 Malignant neoplasm of upper-inner quadrant of left female breast: Secondary | ICD-10-CM

## 2020-07-05 NOTE — Progress Notes (Signed)
Patient called for pre assessment. She denies any pain or concerns at this time.  

## 2020-07-06 ENCOUNTER — Other Ambulatory Visit: Payer: Self-pay

## 2020-07-06 ENCOUNTER — Inpatient Hospital Stay: Payer: Medicaid Other

## 2020-07-06 ENCOUNTER — Inpatient Hospital Stay (HOSPITAL_BASED_OUTPATIENT_CLINIC_OR_DEPARTMENT_OTHER): Payer: Medicaid Other | Admitting: Oncology

## 2020-07-06 ENCOUNTER — Telehealth: Payer: Self-pay | Admitting: *Deleted

## 2020-07-06 ENCOUNTER — Encounter: Payer: Self-pay | Admitting: Oncology

## 2020-07-06 ENCOUNTER — Encounter: Payer: Self-pay | Admitting: *Deleted

## 2020-07-06 VITALS — BP 138/86 | HR 66 | Temp 97.4°F | Wt 101.1 lb

## 2020-07-06 DIAGNOSIS — Z171 Estrogen receptor negative status [ER-]: Secondary | ICD-10-CM

## 2020-07-06 DIAGNOSIS — C50212 Malignant neoplasm of upper-inner quadrant of left female breast: Secondary | ICD-10-CM | POA: Diagnosis not present

## 2020-07-06 DIAGNOSIS — Z5111 Encounter for antineoplastic chemotherapy: Secondary | ICD-10-CM | POA: Diagnosis not present

## 2020-07-06 DIAGNOSIS — G43909 Migraine, unspecified, not intractable, without status migrainosus: Secondary | ICD-10-CM

## 2020-07-06 LAB — COMPREHENSIVE METABOLIC PANEL
ALT: 14 U/L (ref 0–44)
AST: 19 U/L (ref 15–41)
Albumin: 3.6 g/dL (ref 3.5–5.0)
Alkaline Phosphatase: 93 U/L (ref 38–126)
Anion gap: 10 (ref 5–15)
BUN: 25 mg/dL — ABNORMAL HIGH (ref 6–20)
CO2: 23 mmol/L (ref 22–32)
Calcium: 8.8 mg/dL — ABNORMAL LOW (ref 8.9–10.3)
Chloride: 105 mmol/L (ref 98–111)
Creatinine, Ser: 0.92 mg/dL (ref 0.44–1.00)
GFR calc Af Amer: >60 mL/min (ref >60)
GFR calc non Af Amer: >60 mL/min (ref >60)
Glucose, Bld: 109 mg/dL — ABNORMAL HIGH (ref 70–99)
Potassium: 3.7 mmol/L (ref 3.5–5.1)
Sodium: 138 mmol/L (ref 135–145)
Total Bilirubin: 0.5 mg/dL (ref 0.3–1.2)
Total Protein: 6.2 g/dL — ABNORMAL LOW (ref 6.5–8.1)

## 2020-07-06 LAB — CBC WITH DIFFERENTIAL/PLATELET
Abs Immature Granulocytes: 0.06 10*3/uL (ref 0.00–0.07)
Basophils Absolute: 0.1 10*3/uL (ref 0.0–0.1)
Basophils Relative: 1 %
Eosinophils Absolute: 0.2 10*3/uL (ref 0.0–0.5)
Eosinophils Relative: 4 %
HCT: 34.6 % — ABNORMAL LOW (ref 36.0–46.0)
Hemoglobin: 11.2 g/dL — ABNORMAL LOW (ref 12.0–15.0)
Immature Granulocytes: 1 %
Lymphocytes Relative: 24 %
Lymphs Abs: 1.3 10*3/uL (ref 0.7–4.0)
MCH: 34.1 pg — ABNORMAL HIGH (ref 26.0–34.0)
MCHC: 32.4 g/dL (ref 30.0–36.0)
MCV: 105.5 fL — ABNORMAL HIGH (ref 80.0–100.0)
Monocytes Absolute: 0.5 10*3/uL (ref 0.1–1.0)
Monocytes Relative: 9 %
Neutro Abs: 3.3 10*3/uL (ref 1.7–7.7)
Neutrophils Relative %: 61 %
Platelets: 282 10*3/uL (ref 150–400)
RBC: 3.28 MIL/uL — ABNORMAL LOW (ref 3.87–5.11)
RDW: 14.9 % (ref 11.5–15.5)
WBC: 5.6 10*3/uL (ref 4.0–10.5)
nRBC: 0 % (ref 0.0–0.2)

## 2020-07-06 MED ORDER — FAMOTIDINE IN NACL 20-0.9 MG/50ML-% IV SOLN
20.0000 mg | Freq: Once | INTRAVENOUS | Status: AC
  Administered 2020-07-06: 20 mg via INTRAVENOUS
  Filled 2020-07-06: qty 50

## 2020-07-06 MED ORDER — DIPHENHYDRAMINE HCL 50 MG/ML IJ SOLN
25.0000 mg | Freq: Once | INTRAMUSCULAR | Status: AC
  Administered 2020-07-06: 25 mg via INTRAVENOUS
  Filled 2020-07-06: qty 1

## 2020-07-06 MED ORDER — BUTALBITAL-APAP-CAFFEINE 50-325-40 MG PO TABS
1.0000 | ORAL_TABLET | Freq: Four times a day (QID) | ORAL | 0 refills | Status: DC | PRN
Start: 2020-07-06 — End: 2020-08-30

## 2020-07-06 MED ORDER — SODIUM CHLORIDE 0.9 % IV SOLN
Freq: Once | INTRAVENOUS | Status: AC
  Filled 2020-07-06: qty 250

## 2020-07-06 MED ORDER — HEPARIN SOD (PORK) LOCK FLUSH 100 UNIT/ML IV SOLN
500.0000 [IU] | Freq: Once | INTRAVENOUS | Status: AC | PRN
  Administered 2020-07-06: 500 [IU]
  Filled 2020-07-06: qty 5

## 2020-07-06 MED ORDER — SODIUM CHLORIDE 0.9% FLUSH
10.0000 mL | Freq: Once | INTRAVENOUS | Status: AC
  Administered 2020-07-06: 10 mL via INTRAVENOUS
  Filled 2020-07-06: qty 10

## 2020-07-06 MED ORDER — HEPARIN SOD (PORK) LOCK FLUSH 100 UNIT/ML IV SOLN
INTRAVENOUS | Status: AC
  Filled 2020-07-06: qty 5

## 2020-07-06 MED ORDER — SODIUM CHLORIDE 0.9 % IV SOLN
10.0000 mg | Freq: Once | INTRAVENOUS | Status: AC
  Administered 2020-07-06: 10 mg via INTRAVENOUS
  Filled 2020-07-06: qty 10

## 2020-07-06 MED ORDER — HEPARIN SOD (PORK) LOCK FLUSH 100 UNIT/ML IV SOLN
500.0000 [IU] | Freq: Once | INTRAVENOUS | Status: DC
  Filled 2020-07-06: qty 5

## 2020-07-06 MED ORDER — SODIUM CHLORIDE 0.9 % IV SOLN
80.0000 mg/m2 | Freq: Once | INTRAVENOUS | Status: AC
  Administered 2020-07-06: 120 mg via INTRAVENOUS
  Filled 2020-07-06: qty 20

## 2020-07-06 MED ORDER — ALPRAZOLAM 0.5 MG PO TABS: 0.5000 mg | ORAL_TABLET | Freq: Every evening | ORAL | 0 refills | Status: DC | PRN

## 2020-07-06 NOTE — Progress Notes (Signed)
Met patient during her chemotherapy.  No needs at this time.

## 2020-07-06 NOTE — Telephone Encounter (Signed)
Entered in error

## 2020-07-06 NOTE — Progress Notes (Signed)
Nutrition Follow-up:  Patient with triple negative breast cancer followed by Dr. [name].  Patient receiving adjuvant taxol.    Met with patient during infusion.  Patient reports that her appetite is good and she eats all the time.  Reports eating steak and cheese sub recently, corn, cookies, sherbet, vienna sausages.  Drinks ensure 2-3 times per day.  Requesting some today.  Reports stomach was upset yesterday (loose stool) after eating salad. Reports taste is coming back.     Medications: reviewed  Labs: glucose 109, BUN 25  Anthropometrics:   Weight 101 lb decreased slightly from 102 lb on 7/22.    98 lb 3.2 oz on 6/24 104 lb 3.2 oz on 6/10 5/13 104 lb on 14.4 oz 2/22 108 lb 12.8 oz  NUTRITION DIAGNOSIS: Inadequate oral intake stable   INTERVENTION:  Complimentary case of ensure enlive given today. Discussed strategies to increase calories and protein.     MONITORING, EVALUATION, GOAL: weight trends, intake   NEXT VISIT: as needed  Deanna Ramsey B. Deanna Ramsey, [place], [place] Registered Dietitian [phone] (mobile)

## 2020-07-09 NOTE — Progress Notes (Signed)
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  INTERVAL HISTORY: Patient returns to clinic today for further evaluation and consideration of cycle 7 of weekly Taxol.  She feels more "despondent" this week mainly secondary to her financial status and employment status.  She continues to have chronic weakness and fatigue.  She does not complain of headache or migraine this week.  She has no neurologic complaints.  She has a fair appetite.  She has no chest pain, shortness of breath, or hemoptysis.  She denies any nausea, vomiting, constipation, or diarrhea.  She has no urinary complaints.  Patient offers no further specific complaints today.  REVIEW OF SYSTEMS:   Review of Systems  Constitutional: Positive for malaise/fatigue. Negative for fever and weight loss.  HENT: Negative for congestion.   Respiratory: Negative.  Negative for cough, hemoptysis and shortness of breath.   Cardiovascular: Negative.  Negative for chest pain and leg swelling.  Gastrointestinal: Negative.  Negative for abdominal pain and nausea.  Genitourinary: Negative.  Negative for dysuria.  Musculoskeletal: Negative.  Negative for back pain.  Skin: Negative.  Negative for rash.  Neurological: Positive for weakness. Negative for dizziness, focal weakness and headaches.  Psychiatric/Behavioral: Negative.  The patient is not nervous/anxious and does not have insomnia.     As per HPI. Otherwise, a complete review of systems is  negative.  PAST MEDICAL HISTORY: Past Medical History:  Diagnosis Date  . Arthritis   . Cellulitis   . COPD (chronic obstructive pulmonary disease) ([place])   . Depression   . History of kidney stones   . History of tracheostomy 2000   stated that epiglottis swelled up for an unknown reason; had trach for about a week.   . Hypertension   . Pneumonia     PAST SURGICAL HISTORY: Past Surgical History:  Procedure Laterality Date  . arm surgery  1984   fractured ulna  . BREAST LUMPECTOMY WITH SENTINEL LYMPH NODE BIOPSY Left 02/04/2020   Procedure: BREAST LUMPECTOMY WITH SENTINEL LYMPH NODE BX;  Surgeon: Jules Husbands, MD;  Location: [facility] ORS;  Service: General;  Laterality: Left;  . CLEFT LIP REPAIR    . FRACTURE SURGERY    . kidney stones    . lymphnode neck    . PORTACATH PLACEMENT N/A 02/04/2020   Procedure: INSERTION PORT-A-CATH;  Surgeon: Jules Husbands, MD;  Location: [facility] ORS;  Service: General;  Laterality: N/A;  . RE-EXCISION OF BREAST LUMPECTOMY Left 02/24/2020   Procedure: RE-EXCISION OF Left BREAST LUMPECTOMY;  Surgeon: Jules Husbands, MD;  Location: [facility] ORS;  Service: General;  Laterality: Left;  . TYMPANOSTOMY TUBE PLACEMENT      FAMILY HISTORY: Family History  Problem Relation Age of Onset  . COPD Other   . COPD Mother   . Hypertension Mother   . COPD Father   . Breast cancer Neg Hx     ADVANCED DIRECTIVES (Y/N):  N  HEALTH MAINTENANCE: Social History   Tobacco Use  . Smoking status: Current Every Day Smoker    Packs/day: 1.00  Types: Cigarettes  . Smokeless tobacco: Never Used  Vaping Use  . Vaping Use: Never used  Substance Use Topics  . Alcohol use: No  . Drug use: No     Colonoscopy:  PAP:  Bone density:  Lipid panel:  Allergies  Allergen Reactions  . Sulfa Antibiotics Nausea And Vomiting    Current Outpatient Medications  Medication Sig Dispense Refill  . acetaminophen (TYLENOL) 325 MG tablet Take 650 mg by mouth every 6 (six)  hours as needed for moderate pain.    Marland Kitchen albuterol (PROVENTIL HFA;VENTOLIN HFA) 108 (90 Base) MCG/ACT inhaler Inhale 2 puffs into the lungs every 6 (six) hours as needed for wheezing or shortness of breath. 1 Inhaler 2  . ALPRAZolam (XANAX) 0.5 MG tablet Take 1 tablet (0.5 mg total) by mouth at bedtime as needed for anxiety. 30 tablet 0  . butalbital-acetaminophen-caffeine (FIORICET) 50-325-40 MG tablet Take 1-2 tablets by mouth every 6 (six) hours as needed for headache. Do not exceed 6 tablets in 24 hour period. 20 tablet 0  . cetirizine (ZYRTEC) 10 MG tablet Take 10 mg by mouth daily.    . chlorpheniramine-HYDROcodone (TUSSIONEX) 10-8 MG/5ML SUER Take 5 mLs by mouth every 12 (twelve) hours as needed for cough. 140 mL 0  . ciprofloxacin (CILOXAN) 0.3 % ophthalmic solution Place 2 drops into the left eye every 4 (four) hours while awake. Administer 1 drop, every 2 hours, while awake, for 2 days. Then 1 drop, every 4 hours, while awake, for the next 5 days. 5 mL 1  . fluticasone (FLOVENT HFA) 110 MCG/ACT inhaler Inhale 2 puffs into the lungs daily.     Marland Kitchen guaiFENesin (MUCINEX) 600 MG 12 hr tablet Take 600 mg by mouth 2 (two) times daily.    Marland Kitchen lidocaine-prilocaine (EMLA) cream Apply to affected area once 30 g 3  . mupirocin ointment (BACTROBAN) 2 % Place 1 application into the nose 2 (two) times daily. 22 g 0  . prochlorperazine (COMPAZINE) 10 MG tablet Take 1 tablet (10 mg total) by mouth every 6 (six) hours as needed (Nausea or vomiting). 60 tablet 2   No current facility-administered medications for this visit.   Facility-Administered Medications Ordered in Other Visits  Medication Dose Route Frequency Provider Last Rate Last Admin  . sodium chloride flush (NS) 0.9 % injection 10 mL  10 mL Intravenous PRN Lloyd Huger, MD   10 mL at 06/01/20 0905    OBJECTIVE: Vitals:   07/13/20 0853  BP: (!) 128/96  Pulse: 89  Resp: 18  Temp: (!) 96.9 F (36.1 C)  SpO2: 99%     Body mass index is  16.68 kg/m.    ECOG FS:0 - Asymptomatic  General: Well-developed, well-nourished, no acute distress. Eyes: Pink conjunctiva, anicteric sclera. HEENT: Normocephalic, moist mucous membranes. Lungs: No audible wheezing or coughing. Heart: Regular rate and rhythm. Abdomen: Soft, nontender, no obvious distention. Musculoskeletal: No edema, cyanosis, or clubbing. Neuro: Alert, answering all questions appropriately. Cranial nerves grossly intact. Skin: No rashes or petechiae noted. Psych: Normal affect.  LAB RESULTS:  Lab Results  Component Value Date   NA 134 (L) 07/13/2020   K 3.6 07/13/2020   CL 101 07/13/2020   CO2 23 07/13/2020   GLUCOSE 119 (H) 07/13/2020   BUN 22 (H) 07/13/2020   CREATININE 0.77 07/13/2020   CALCIUM 8.8 (L) 07/13/2020   PROT 7.2 07/13/2020   ALBUMIN 4.1 07/13/2020   AST 18 07/13/2020   ALT 13 07/13/2020   ALKPHOS  97 07/13/2020   BILITOT 0.5 07/13/2020   GFRNONAA >60 07/13/2020   GFRAA >60 07/13/2020    Lab Results  Component Value Date   WBC 6.1 07/13/2020   NEUTROABS 4.0 07/13/2020   HGB 12.6 07/13/2020   HCT 38.0 07/13/2020   MCV 104.1 (H) 07/13/2020   PLT 351 07/13/2020     STUDIES: No results found.  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  PLAN:    1.  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  Because of positive margins she underwent reexcision on February 24, 2020 with clear margins.  She has now had a port placed as well.  MUGA scan from March 22, 2020 reported an EF of 72%. Given the triple negative status of her disease, she will receive adjuvant chemotherapy using Adriamycin and Cytoxan with Udenyca support followed by weekly Taxol x12.  Patient will also require adjuvant XRT at the conclusion of her chemotherapy.  An aromatase inhibitor would not offer any  benefit given the triple negative status of her disease.  Genetics referral has been placed.  Patient completed 4 cycles of Adriamycin and Cytoxan on May 04, 2020.  Proceed with cycle 7 of 12 of weekly Taxol today.  Return to clinic in 1 week for further evaluation and consideration of cycle 8.  Patient also receives 1 L of IV fluids with the remainder of her treatments. 2.  Anxiety/insomnia: Continue Xanax 0.5 mg at night as needed.   Patient expressed understanding given the conclusion of her treatments further management should be transitioned to her primary care physician. 3.  Migraines: Continue Fioricet as needed.  Patient was previously given a referral to neurology for further evaluation. 4.  Depression: Improved.  Patient declined initiation of antidepressant medication or referral for counseling. 5.  Cough: Patient does not complain of this today.  Previously, chest x-ray reviewed independently with no obvious pathology.  Patient has been instructed to continue her her inhalers and steroid prescription as prescribed.  She has completed antibiotics. 6.  Anemia: Resolved. 7.  Renal insufficiency: Resolved.  Continue weekly IV fluids.  Patient expressed understanding and was in agreement with this plan. She also understands that She can call clinic at any time with any questions, concerns, or complaints.   Cancer Staging Carcinoma of upper-inner quadrant of left female breast [facility]) Staging form: Breast, AJCC 8th Edition - Clinical stage from 02/01/2020: Stage IB (cT1c, cN0, cM0, G3, ER-, PR-, HER2-) - Signed by Lloyd Huger, MD on 02/01/2020   Lloyd Huger, MD   07/13/2020 12:04 PM

## 2020-07-13 ENCOUNTER — Inpatient Hospital Stay: Payer: Medicaid Other | Attending: Oncology

## 2020-07-13 ENCOUNTER — Inpatient Hospital Stay: Payer: Medicaid Other

## 2020-07-13 ENCOUNTER — Encounter: Payer: Self-pay | Admitting: Oncology

## 2020-07-13 ENCOUNTER — Other Ambulatory Visit: Payer: Self-pay

## 2020-07-13 ENCOUNTER — Inpatient Hospital Stay (HOSPITAL_BASED_OUTPATIENT_CLINIC_OR_DEPARTMENT_OTHER): Payer: Medicaid Other | Admitting: Oncology

## 2020-07-13 VITALS — BP 128/96 | HR 89 | Temp 96.9°F | Resp 18 | Wt 101.8 lb

## 2020-07-13 DIAGNOSIS — Z171 Estrogen receptor negative status [ER-]: Secondary | ICD-10-CM | POA: Insufficient documentation

## 2020-07-13 DIAGNOSIS — C50212 Malignant neoplasm of upper-inner quadrant of left female breast: Secondary | ICD-10-CM | POA: Diagnosis present

## 2020-07-13 DIAGNOSIS — Z5111 Encounter for antineoplastic chemotherapy: Secondary | ICD-10-CM | POA: Insufficient documentation

## 2020-07-13 DIAGNOSIS — F17210 Nicotine dependence, cigarettes, uncomplicated: Secondary | ICD-10-CM | POA: Insufficient documentation

## 2020-07-13 LAB — CBC WITH DIFFERENTIAL/PLATELET
Abs Immature Granulocytes: 0.05 10*3/uL (ref 0.00–0.07)
Basophils Absolute: 0.1 10*3/uL (ref 0.0–0.1)
Basophils Relative: 1 %
Eosinophils Absolute: 0.1 10*3/uL (ref 0.0–0.5)
Eosinophils Relative: 2 %
HCT: 38 % (ref 36.0–46.0)
Hemoglobin: 12.6 g/dL (ref 12.0–15.0)
Immature Granulocytes: 1 %
Lymphocytes Relative: 22 %
Lymphs Abs: 1.3 10*3/uL (ref 0.7–4.0)
MCH: 34.5 pg — ABNORMAL HIGH (ref 26.0–34.0)
MCHC: 33.2 g/dL (ref 30.0–36.0)
MCV: 104.1 fL — ABNORMAL HIGH (ref 80.0–100.0)
Monocytes Absolute: 0.5 10*3/uL (ref 0.1–1.0)
Monocytes Relative: 9 %
Neutro Abs: 4 10*3/uL (ref 1.7–7.7)
Neutrophils Relative %: 65 %
Platelets: 351 10*3/uL (ref 150–400)
RBC: 3.65 MIL/uL — ABNORMAL LOW (ref 3.87–5.11)
RDW: 14 % (ref 11.5–15.5)
WBC: 6.1 10*3/uL (ref 4.0–10.5)
nRBC: 0 % (ref 0.0–0.2)

## 2020-07-13 LAB — COMPREHENSIVE METABOLIC PANEL
ALT: 13 U/L (ref 0–44)
AST: 18 U/L (ref 15–41)
Albumin: 4.1 g/dL (ref 3.5–5.0)
Alkaline Phosphatase: 97 U/L (ref 38–126)
Anion gap: 10 (ref 5–15)
BUN: 22 mg/dL — ABNORMAL HIGH (ref 6–20)
CO2: 23 mmol/L (ref 22–32)
Calcium: 8.8 mg/dL — ABNORMAL LOW (ref 8.9–10.3)
Chloride: 101 mmol/L (ref 98–111)
Creatinine, Ser: 0.77 mg/dL (ref 0.44–1.00)
GFR calc Af Amer: >60 mL/min (ref >60)
GFR calc non Af Amer: >60 mL/min (ref >60)
Glucose, Bld: 119 mg/dL — ABNORMAL HIGH (ref 70–99)
Potassium: 3.6 mmol/L (ref 3.5–5.1)
Sodium: 134 mmol/L — ABNORMAL LOW (ref 135–145)
Total Bilirubin: 0.5 mg/dL (ref 0.3–1.2)
Total Protein: 7.2 g/dL (ref 6.5–8.1)

## 2020-07-13 MED ORDER — FAMOTIDINE IN NACL 20-0.9 MG/50ML-% IV SOLN
20.0000 mg | Freq: Once | INTRAVENOUS | Status: AC
  Administered 2020-07-13: 20 mg via INTRAVENOUS
  Filled 2020-07-13: qty 50

## 2020-07-13 MED ORDER — SODIUM CHLORIDE 0.9 % IV SOLN
80.0000 mg/m2 | Freq: Once | INTRAVENOUS | Status: AC
  Administered 2020-07-13: 120 mg via INTRAVENOUS
  Filled 2020-07-13: qty 20

## 2020-07-13 MED ORDER — DIPHENHYDRAMINE HCL 50 MG/ML IJ SOLN
25.0000 mg | Freq: Once | INTRAMUSCULAR | Status: AC
  Administered 2020-07-13: 25 mg via INTRAVENOUS
  Filled 2020-07-13: qty 1

## 2020-07-13 MED ORDER — SODIUM CHLORIDE 0.9 % IV SOLN
Freq: Once | INTRAVENOUS | Status: AC
  Filled 2020-07-13: qty 250

## 2020-07-13 MED ORDER — HEPARIN SOD (PORK) LOCK FLUSH 100 UNIT/ML IV SOLN
500.0000 [IU] | Freq: Once | INTRAVENOUS | Status: AC | PRN
  Administered 2020-07-13: 500 [IU]
  Filled 2020-07-13: qty 5

## 2020-07-13 MED ORDER — SODIUM CHLORIDE 0.9 % IV SOLN
10.0000 mg | Freq: Once | INTRAVENOUS | Status: AC
  Administered 2020-07-13: 10 mg via INTRAVENOUS
  Filled 2020-07-13: qty 10

## 2020-07-13 MED ORDER — HEPARIN SOD (PORK) LOCK FLUSH 100 UNIT/ML IV SOLN
INTRAVENOUS | Status: AC
  Filled 2020-07-13: qty 5

## 2020-07-13 MED ORDER — SODIUM CHLORIDE 0.9% FLUSH
10.0000 mL | Freq: Once | INTRAVENOUS | Status: AC
  Administered 2020-07-13: 10 mL via INTRAVENOUS
  Filled 2020-07-13: qty 10

## 2020-07-13 NOTE — Progress Notes (Signed)
Pt states she is very emotionally drained, she states she has no support systems, has trouble affording food, since she cant work. Pt stated she  Would like someone to talk to, pt was tearful, then she stated she will be alright.[facility]

## 2020-07-14 NOTE — Progress Notes (Signed)
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  INTERVAL HISTORY: Patient returns to clinic today for further evaluation and consideration of cycle 8 of weekly Taxol.  Her mood is improved this week.  She continues to have chronic weakness and fatigue. She does not complain of headache or migraine this week.  She has no neurologic complaints.  She has a fair appetite.  She has no chest pain, shortness of breath, or hemoptysis.  She denies any nausea, vomiting, constipation, or diarrhea.  She has no urinary complaints.  Patient offers no specific complaints today.  REVIEW OF SYSTEMS:   Review of Systems  Constitutional: Positive for malaise/fatigue. Negative for fever and weight loss.  HENT: Negative for congestion.   Respiratory: Negative.  Negative for cough, hemoptysis and shortness of breath.   Cardiovascular: Negative.  Negative for chest pain and leg swelling.  Gastrointestinal: Negative.  Negative for abdominal pain and nausea.  Genitourinary: Negative.  Negative for dysuria.  Musculoskeletal: Negative.  Negative for back pain.  Skin: Negative.  Negative for rash.  Neurological: Positive for weakness. Negative for dizziness, focal weakness and headaches.  Psychiatric/Behavioral: Negative.  The patient is not nervous/anxious and does not have insomnia.     As per HPI. Otherwise, a complete review of systems is negative.  PAST MEDICAL HISTORY: Past Medical History:  Diagnosis Date  .  Arthritis   . Cellulitis   . COPD (chronic obstructive pulmonary disease) ([place])   . Depression   . History of kidney stones   . History of tracheostomy 2000   stated that epiglottis swelled up for an unknown reason; had trach for about a week.   . Hypertension   . Pneumonia     PAST SURGICAL HISTORY: Past Surgical History:  Procedure Laterality Date  . arm surgery  1984   fractured ulna  . BREAST LUMPECTOMY WITH SENTINEL LYMPH NODE BIOPSY Left 02/04/2020   Procedure: BREAST LUMPECTOMY WITH SENTINEL LYMPH NODE BX;  Surgeon: Jules Husbands, MD;  Location: [facility] ORS;  Service: General;  Laterality: Left;  . CLEFT LIP REPAIR    . FRACTURE SURGERY    . kidney stones    . lymphnode neck    . PORTACATH PLACEMENT N/A 02/04/2020   Procedure: INSERTION PORT-A-CATH;  Surgeon: Jules Husbands, MD;  Location: [facility] ORS;  Service: General;  Laterality: N/A;  . RE-EXCISION OF BREAST LUMPECTOMY Left 02/24/2020   Procedure: RE-EXCISION OF Left BREAST LUMPECTOMY;  Surgeon: Jules Husbands, MD;  Location: [facility] ORS;  Service: General;  Laterality: Left;  . TYMPANOSTOMY TUBE PLACEMENT      FAMILY HISTORY: Family History  Problem Relation Age of Onset  . COPD Other   . COPD Mother   . Hypertension Mother   . COPD Father   . Breast cancer Neg Hx     ADVANCED DIRECTIVES (Y/N):  N  HEALTH MAINTENANCE: Social History   Tobacco Use  . Smoking status: Current Every Day Smoker    Packs/day: 1.00    Types: Cigarettes  . Smokeless tobacco: Never Used  Vaping Use  . Vaping Use: Never used  Substance Use Topics  . Alcohol use: No  . Drug use: No     Colonoscopy:  PAP:  Bone density:  Lipid panel:  Allergies  Allergen Reactions  . Sulfa Antibiotics Nausea And Vomiting    Current Outpatient Medications  Medication Sig Dispense Refill  . acetaminophen (TYLENOL) 325 MG tablet Take 650 mg by mouth every 6 (six) hours as needed for moderate pain.    Marland Kitchen ALPRAZolam (XANAX) 0.5 MG tablet Take 1  tablet (0.5 mg total) by mouth at bedtime as needed for anxiety. 30 tablet 0  . butalbital-acetaminophen-caffeine (FIORICET) 50-325-40 MG tablet Take 1-2 tablets by mouth every 6 (six) hours as needed for headache. Do not exceed 6 tablets in 24 hour period. 20 tablet 0  . lidocaine-prilocaine (EMLA) cream Apply to affected area once 30 g 3  . prochlorperazine (COMPAZINE) 10 MG tablet Take 1 tablet (10 mg total) by mouth every 6 (six) hours as needed (Nausea or vomiting). 60 tablet 2  . albuterol (PROVENTIL HFA;VENTOLIN HFA) 108 (90 Base) MCG/ACT inhaler Inhale 2 puffs into the lungs every 6 (six) hours as needed for wheezing or shortness of breath. (Patient not taking: Reported on 07/20/2020) 1 Inhaler 2  . cetirizine (ZYRTEC) 10 MG tablet Take 10 mg by mouth daily. (Patient not taking: Reported on 07/20/2020)    . chlorpheniramine-HYDROcodone (TUSSIONEX) 10-8 MG/5ML SUER Take 5 mLs by mouth every 12 (twelve) hours as needed for cough. (Patient not taking: Reported on 07/20/2020) 140 mL 0  . ciprofloxacin (CILOXAN) 0.3 % ophthalmic solution Place 2 drops into the left eye every 4 (four) hours while awake. Administer 1 drop, every 2 hours, while awake, for 2 days. Then 1 drop, every 4 hours, while awake, for the next 5 days. (Patient not taking: Reported on 07/20/2020) 5 mL 1  . fluticasone (FLOVENT HFA) 110 MCG/ACT inhaler Inhale 2 puffs into the lungs daily.  (Patient not taking: Reported on 07/20/2020)    . guaiFENesin (MUCINEX) 600 MG 12 hr tablet Take 600 mg by mouth 2 (two) times daily. (Patient not taking: Reported on 07/20/2020)    . mupirocin ointment (BACTROBAN) 2 % Place 1 application into the nose 2 (two) times daily. (Patient not taking: Reported on 07/20/2020) 22 g 0   No current facility-administered medications for this visit.   Facility-Administered Medications Ordered in Other Visits  Medication Dose Route Frequency Provider Last Rate Last Admin  . sodium chloride flush (NS) 0.9 % injection  10 mL  10 mL Intravenous PRN Lloyd Huger, MD   10 mL at 06/01/20 0905    OBJECTIVE: Vitals:   07/20/20 0853  BP: (!) 147/89  Pulse: 72  Resp: 19  SpO2: 100%     Body mass index is 17.32 kg/m.    ECOG FS:0 - Asymptomatic  General: Well-developed, well-nourished, no acute distress. Eyes: Pink conjunctiva, anicteric sclera. HEENT: Normocephalic, moist mucous membranes. Lungs: No audible wheezing or coughing. Heart: Regular rate and rhythm. Abdomen: Soft, nontender, no obvious distention. Musculoskeletal: No edema, cyanosis, or clubbing. Neuro: Alert, answering all questions appropriately. Cranial nerves grossly intact. Skin: No rashes or petechiae noted. Psych: Normal affect.   LAB RESULTS:  Lab Results  Component Value Date   NA 140 07/20/2020   K 3.5 07/20/2020   CL 109 07/20/2020   CO2 22 07/20/2020   GLUCOSE 93 07/20/2020   BUN 15 07/20/2020   CREATININE 0.70 07/20/2020   CALCIUM 8.6 (L)  07/20/2020   PROT 6.5 07/20/2020   ALBUMIN 3.6 07/20/2020   AST 15 07/20/2020   ALT 10 07/20/2020   ALKPHOS 81 07/20/2020   BILITOT 0.3 07/20/2020   GFRNONAA >60 07/20/2020   GFRAA >60 07/20/2020    Lab Results  Component Value Date   WBC 5.6 07/20/2020   NEUTROABS 3.7 07/20/2020   HGB 11.6 (L) 07/20/2020   HCT 35.4 (L) 07/20/2020   MCV 102.9 (H) 07/20/2020   PLT 296 07/20/2020     STUDIES: No results found.  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  PLAN:    1.  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  Because of positive margins she underwent reexcision on February 24, 2020 with clear margins.  She has now had a port placed as well.  MUGA scan from March 22, 2020 reported an EF of 72%. Given the triple negative status of her disease, she will receive adjuvant chemotherapy using  Adriamycin and Cytoxan with Udenyca support followed by weekly Taxol x12.  Patient will also require adjuvant XRT at the conclusion of her chemotherapy.  An aromatase inhibitor would not offer any benefit given the triple negative status of her disease.  Genetics referral has been placed.  Patient completed 4 cycles of Adriamycin and Cytoxan on May 04, 2020.  Proceed with cycle 8 of 12 of weekly Taxol today.  Return to clinic in 1 week for further evaluation and consideration of cycle 9.  Patient also receives 1 L of IV fluids with every treatment. 2.  Anxiety/insomnia: Continue Xanax 0.5 mg at night as needed.   Patient expressed understanding given the conclusion of her treatments further management should be transitioned to her primary care physician. 3.  Migraines: Continue Fioricet as needed.  Patient was previously given a referral to neurology for further evaluation. 4.  Depression: Improved.  Patient declined initiation of antidepressant medication or referral for counseling. 5.  Cough: Patient does not complain of this today.  Previously, chest x-ray reviewed independently with no obvious pathology.  Patient has been instructed to continue her her inhalers and steroid prescription as prescribed.  She has completed antibiotics. 6.  Anemia: Mild, monitor. 7.  Renal insufficiency: Resolved.  Continue weekly IV fluids.  I spent a total of 30 minutes reviewing chart data, face-to-face evaluation with the patient, counseling and coordination of care as detailed above.   Patient expressed understanding and was in agreement with this plan. She also understands that She can call clinic at any time with any questions, concerns, or complaints.   Cancer Staging Carcinoma of upper-inner quadrant of left female breast [facility]) Staging form: Breast, AJCC 8th Edition - Clinical stage from 02/01/2020: Stage IB (cT1c, cN0, cM0, G3, ER-, PR-, HER2-) - Signed by Lloyd Huger, MD on 02/01/2020   Lloyd Huger, MD   07/20/2020 2:47 PM

## 2020-07-20 ENCOUNTER — Other Ambulatory Visit: Payer: Self-pay

## 2020-07-20 ENCOUNTER — Inpatient Hospital Stay (HOSPITAL_BASED_OUTPATIENT_CLINIC_OR_DEPARTMENT_OTHER): Payer: Medicaid Other | Admitting: Oncology

## 2020-07-20 ENCOUNTER — Inpatient Hospital Stay: Payer: Medicaid Other

## 2020-07-20 VITALS — BP 147/89 | HR 72 | Resp 19 | Wt 105.7 lb

## 2020-07-20 DIAGNOSIS — C50212 Malignant neoplasm of upper-inner quadrant of left female breast: Secondary | ICD-10-CM

## 2020-07-20 DIAGNOSIS — Z171 Estrogen receptor negative status [ER-]: Secondary | ICD-10-CM | POA: Diagnosis not present

## 2020-07-20 DIAGNOSIS — Z5111 Encounter for antineoplastic chemotherapy: Secondary | ICD-10-CM | POA: Diagnosis not present

## 2020-07-20 LAB — CBC WITH DIFFERENTIAL/PLATELET
Abs Immature Granulocytes: 0.04 10*3/uL (ref 0.00–0.07)
Basophils Absolute: 0.1 10*3/uL (ref 0.0–0.1)
Basophils Relative: 1 %
Eosinophils Absolute: 0.2 10*3/uL (ref 0.0–0.5)
Eosinophils Relative: 3 %
HCT: 35.4 % — ABNORMAL LOW (ref 36.0–46.0)
Hemoglobin: 11.6 g/dL — ABNORMAL LOW (ref 12.0–15.0)
Immature Granulocytes: 1 %
Lymphocytes Relative: 20 %
Lymphs Abs: 1.1 10*3/uL (ref 0.7–4.0)
MCH: 33.7 pg (ref 26.0–34.0)
MCHC: 32.8 g/dL (ref 30.0–36.0)
MCV: 102.9 fL — ABNORMAL HIGH (ref 80.0–100.0)
Monocytes Absolute: 0.4 10*3/uL (ref 0.1–1.0)
Monocytes Relative: 8 %
Neutro Abs: 3.7 10*3/uL (ref 1.7–7.7)
Neutrophils Relative %: 67 %
Platelets: 296 10*3/uL (ref 150–400)
RBC: 3.44 MIL/uL — ABNORMAL LOW (ref 3.87–5.11)
RDW: 13.1 % (ref 11.5–15.5)
WBC: 5.6 10*3/uL (ref 4.0–10.5)
nRBC: 0 % (ref 0.0–0.2)

## 2020-07-20 LAB — COMPREHENSIVE METABOLIC PANEL
ALT: 10 U/L (ref 0–44)
AST: 15 U/L (ref 15–41)
Albumin: 3.6 g/dL (ref 3.5–5.0)
Alkaline Phosphatase: 81 U/L (ref 38–126)
Anion gap: 9 (ref 5–15)
BUN: 15 mg/dL (ref 6–20)
CO2: 22 mmol/L (ref 22–32)
Calcium: 8.6 mg/dL — ABNORMAL LOW (ref 8.9–10.3)
Chloride: 109 mmol/L (ref 98–111)
Creatinine, Ser: 0.7 mg/dL (ref 0.44–1.00)
GFR calc Af Amer: >60 mL/min (ref >60)
GFR calc non Af Amer: >60 mL/min (ref >60)
Glucose, Bld: 93 mg/dL (ref 70–99)
Potassium: 3.5 mmol/L (ref 3.5–5.1)
Sodium: 140 mmol/L (ref 135–145)
Total Bilirubin: 0.3 mg/dL (ref 0.3–1.2)
Total Protein: 6.5 g/dL (ref 6.5–8.1)

## 2020-07-20 MED ORDER — HEPARIN SOD (PORK) LOCK FLUSH 100 UNIT/ML IV SOLN
500.0000 [IU] | Freq: Once | INTRAVENOUS | Status: AC | PRN
  Administered 2020-07-20: 500 [IU]
  Filled 2020-07-20: qty 5

## 2020-07-20 MED ORDER — SODIUM CHLORIDE 0.9 % IV SOLN
80.0000 mg/m2 | Freq: Once | INTRAVENOUS | Status: AC
  Administered 2020-07-20: 120 mg via INTRAVENOUS
  Filled 2020-07-20: qty 20

## 2020-07-20 MED ORDER — SODIUM CHLORIDE 0.9 % IV SOLN
10.0000 mg | Freq: Once | INTRAVENOUS | Status: AC
  Administered 2020-07-20: 10 mg via INTRAVENOUS
  Filled 2020-07-20: qty 10

## 2020-07-20 MED ORDER — DIPHENHYDRAMINE HCL 50 MG/ML IJ SOLN
25.0000 mg | Freq: Once | INTRAMUSCULAR | Status: AC
  Administered 2020-07-20: 25 mg via INTRAVENOUS
  Filled 2020-07-20: qty 1

## 2020-07-20 MED ORDER — FAMOTIDINE IN NACL 20-0.9 MG/50ML-% IV SOLN
20.0000 mg | Freq: Once | INTRAVENOUS | Status: AC
  Administered 2020-07-20: 20 mg via INTRAVENOUS
  Filled 2020-07-20: qty 50

## 2020-07-20 MED ORDER — SODIUM CHLORIDE 0.9% FLUSH
10.0000 mL | Freq: Once | INTRAVENOUS | Status: AC
  Administered 2020-07-20: 10 mL via INTRAVENOUS
  Filled 2020-07-20: qty 10

## 2020-07-20 MED ORDER — HEPARIN SOD (PORK) LOCK FLUSH 100 UNIT/ML IV SOLN
INTRAVENOUS | Status: AC
  Filled 2020-07-20: qty 5

## 2020-07-20 MED ORDER — SODIUM CHLORIDE 0.9 % IV SOLN
Freq: Once | INTRAVENOUS | Status: AC
  Filled 2020-07-20: qty 250

## 2020-07-20 NOTE — Progress Notes (Signed)
States the only real problem after chemo is diarrhea x 2 days. Mild neuropathy and restless legs. Appetite is good.

## 2020-07-21 NOTE — Progress Notes (Signed)
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  INTERVAL HISTORY: Patient returns to clinic today for further evaluation and consideration of cycle 9 of weekly Taxol.  She reports diarrhea for 1 to 2 days after treatment, but otherwise feels well.  She continues to have chronic weakness and fatigue. She does not complain of headache or migraine this week.  She has no neurologic complaints.  She has a fair appetite.  She has no chest pain, shortness of breath, or hemoptysis.  She denies any nausea, vomiting, or constipation.  She has no urinary complaints.  Patient offers no specific complaints today.  REVIEW OF SYSTEMS:   Review of Systems  Constitutional: Positive for malaise/fatigue. Negative for fever and weight loss.  HENT: Negative for congestion.   Respiratory: Negative.  Negative for cough, hemoptysis and shortness of breath.   Cardiovascular: Negative.  Negative for chest pain and leg swelling.  Gastrointestinal: Positive for diarrhea. Negative for abdominal pain and nausea.  Genitourinary: Negative.  Negative for dysuria.  Musculoskeletal: Negative.  Negative for back pain.  Skin: Negative.  Negative for rash.  Neurological: Positive for weakness. Negative for dizziness, focal weakness and headaches.  Psychiatric/Behavioral: Negative.  The patient is not nervous/anxious and does not have insomnia.     As per HPI. Otherwise, a complete review of systems is negative.  PAST MEDICAL  HISTORY: Past Medical History:  Diagnosis Date  . Arthritis   . Cellulitis   . COPD (chronic obstructive pulmonary disease) ([place])   . Depression   . History of kidney stones   . History of tracheostomy 2000   stated that epiglottis swelled up for an unknown reason; had trach for about a week.   . Hypertension   . Pneumonia     PAST SURGICAL HISTORY: Past Surgical History:  Procedure Laterality Date  . arm surgery  1984   fractured ulna  . BREAST LUMPECTOMY WITH SENTINEL LYMPH NODE BIOPSY Left 02/04/2020   Procedure: BREAST LUMPECTOMY WITH SENTINEL LYMPH NODE BX;  Surgeon: Jules Husbands, MD;  Location: [facility] ORS;  Service: General;  Laterality: Left;  . CLEFT LIP REPAIR    . FRACTURE SURGERY    . kidney stones    . lymphnode neck    . PORTACATH PLACEMENT N/A 02/04/2020   Procedure: INSERTION PORT-A-CATH;  Surgeon: Jules Husbands, MD;  Location: [facility] ORS;  Service: General;  Laterality: N/A;  . RE-EXCISION OF BREAST LUMPECTOMY Left 02/24/2020   Procedure: RE-EXCISION OF Left BREAST LUMPECTOMY;  Surgeon: Jules Husbands, MD;  Location: [facility] ORS;  Service: General;  Laterality: Left;  . TYMPANOSTOMY TUBE PLACEMENT      FAMILY HISTORY: Family History  Problem Relation Age of Onset  . COPD Other   . COPD Mother   . Hypertension Mother   . COPD Father   . Breast cancer Neg Hx     ADVANCED DIRECTIVES (Y/N):  N  HEALTH MAINTENANCE: Social History   Tobacco Use  . Smoking status: Current Every Day Smoker    Packs/day: 1.00  Types: Cigarettes  . Smokeless tobacco: Never Used  Vaping Use  . Vaping Use: Never used  Substance Use Topics  . Alcohol use: No  . Drug use: No     Colonoscopy:  PAP:  Bone density:  Lipid panel:  Allergies  Allergen Reactions  . Sulfa Antibiotics Nausea And Vomiting    Current Outpatient Medications  Medication Sig Dispense Refill  . acetaminophen (TYLENOL) 325 MG tablet Take 650 mg by mouth every 6 (six) hours as needed for moderate  pain.    Marland Kitchen ALPRAZolam (XANAX) 0.5 MG tablet Take 1 tablet (0.5 mg total) by mouth at bedtime as needed for anxiety. 30 tablet 0  . lidocaine-prilocaine (EMLA) cream Apply to affected area once 30 g 3  . lisinopril-hydrochlorothiazide (ZESTORETIC) 20-25 MG tablet Take 1 tablet by mouth daily.    . ondansetron (ZOFRAN) 4 MG tablet Take 4 mg by mouth every 8 (eight) hours as needed for nausea or vomiting.    . prochlorperazine (COMPAZINE) 10 MG tablet Take 1 tablet (10 mg total) by mouth every 6 (six) hours as needed (Nausea or vomiting). 60 tablet 2  . albuterol (PROVENTIL HFA;VENTOLIN HFA) 108 (90 Base) MCG/ACT inhaler Inhale 2 puffs into the lungs every 6 (six) hours as needed for wheezing or shortness of breath. (Patient not taking: Reported on 07/20/2020) 1 Inhaler 2  . butalbital-acetaminophen-caffeine (FIORICET) 50-325-40 MG tablet Take 1-2 tablets by mouth every 6 (six) hours as needed for headache. Do not exceed 6 tablets in 24 hour period. (Patient not taking: Reported on 07/26/2020) 20 tablet 0  . cetirizine (ZYRTEC) 10 MG tablet Take 10 mg by mouth daily. (Patient not taking: Reported on 07/20/2020)    . chlorpheniramine-HYDROcodone (TUSSIONEX) 10-8 MG/5ML SUER Take 5 mLs by mouth every 12 (twelve) hours as needed for cough. (Patient not taking: Reported on 07/20/2020) 140 mL 0  . ciprofloxacin (CILOXAN) 0.3 % ophthalmic solution Place 2 drops into the left eye every 4 (four) hours while awake. Administer 1 drop, every 2 hours, while awake, for 2 days. Then 1 drop, every 4 hours, while awake, for the next 5 days. (Patient not taking: Reported on 07/20/2020) 5 mL 1  . fluticasone (FLOVENT HFA) 110 MCG/ACT inhaler Inhale 2 puffs into the lungs daily.  (Patient not taking: Reported on 07/20/2020)    . guaiFENesin (MUCINEX) 600 MG 12 hr tablet Take 600 mg by mouth 2 (two) times daily. (Patient not taking: Reported on 07/20/2020)    . mupirocin ointment (BACTROBAN) 2 % Place 1 application into the nose 2  (two) times daily. (Patient not taking: Reported on 07/20/2020) 22 g 0   No current facility-administered medications for this visit.   Facility-Administered Medications Ordered in Other Visits  Medication Dose Route Frequency Provider Last Rate Last Admin  . 0.9 %  sodium chloride infusion   Intravenous Once Lloyd Huger, MD      . dexamethasone (DECADRON) 10 mg in sodium chloride 0.9 % 50 mL IVPB  10 mg Intravenous Once Lloyd Huger, MD      . diphenhydrAMINE (BENADRYL) injection 25 mg  25 mg Intravenous Once Lloyd Huger, MD      . famotidine (PEPCID) IVPB 20 mg premix  20 mg Intravenous Once Lloyd Huger, MD      . heparin lock flush 100 unit/mL  500 Units Intracatheter Once PRN Lloyd Huger, MD      . PACLitaxel (TAXOL) 120 mg in sodium chloride 0.9 % 250 mL  chemo infusion (</= 63m/m2)  80 mg/m2 (Treatment Plan Recorded) Intravenous Once FLloyd Huger MD      . sodium chloride flush (NS) 0.9 % injection 10 mL  10 mL Intravenous PRN FLloyd Huger MD   10 mL at 06/01/20 0905    OBJECTIVE: Vitals:   07/27/20 0847  BP: 113/89  Pulse: 76  Resp: 16  Temp: (!) 96 F (35.6 C)  SpO2: 99%     Body mass index is 16.83 kg/m.    ECOG FS:0 - Asymptomatic  General: Well-developed, well-nourished, no acute distress. Eyes: Pink conjunctiva, anicteric sclera. HEENT: Normocephalic, moist mucous membranes. Lungs: No audible wheezing or coughing. Heart: Regular rate and rhythm. Abdomen: Soft, nontender, no obvious distention. Musculoskeletal: No edema, cyanosis, or clubbing. Neuro: Alert, answering all questions appropriately. Cranial nerves grossly intact. Skin: No rashes or petechiae noted. Psych: Normal affect.   LAB RESULTS:  Lab Results  Component Value Date   NA 139 07/27/2020   K 4.4 07/27/2020   CL 106 07/27/2020   CO2 25 07/27/2020   GLUCOSE 103 (H) 07/27/2020   BUN 19 07/27/2020   CREATININE 0.78 07/27/2020   CALCIUM 8.9  07/27/2020   PROT 7.1 07/27/2020   ALBUMIN 3.7 07/27/2020   AST 19 07/27/2020   ALT 13 07/27/2020   ALKPHOS 86 07/27/2020   BILITOT 0.4 07/27/2020   GFRNONAA >60 07/27/2020   GFRAA >60 07/27/2020    Lab Results  Component Value Date   WBC 6.5 07/27/2020   NEUTROABS 4.6 07/27/2020   HGB 12.8 07/27/2020   HCT 38.9 07/27/2020   MCV 102.1 (H) 07/27/2020   PLT 304 07/27/2020     STUDIES: No results found.  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  PLAN:    1.  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  Because of positive margins she underwent reexcision on February 24, 2020 with clear margins.  She has now had a port placed as well.  MUGA scan from March 22, 2020 reported an EF of 72%. Given the triple negative status of her disease, she will receive adjuvant chemotherapy using Adriamycin and Cytoxan with Udenyca support followed by weekly Taxol x12.  Patient will also require adjuvant XRT at the conclusion of her chemotherapy.  An aromatase inhibitor would not offer any benefit given the triple negative status of her disease.  Genetics referral has been placed.  Patient completed 4 cycles of Adriamycin and Cytoxan on May 04, 2020.  Proceed with cycle 9 of 12 of weekly Taxol today.  Return to clinic in 1 week for further evaluation and consideration of cycle 10.  Patient also receives 1 L of IV fluids with every treatment. 2.  Anxiety/insomnia: Continue Xanax 0.5 mg at night as needed.   Patient expressed understanding given the conclusion of her treatments further management should be transitioned to her primary care physician. 3.  Migraines: Continue Fioricet as needed.  Patient was previously given a referral to neurology for further evaluation. 4.  Depression: Improved.  Patient declined initiation of  antidepressant medication or referral for counseling. 5.  Cough: Patient does not complain of this today.  Previously, chest x-ray reviewed independently with no obvious pathology.  Patient has been instructed to continue her her inhalers and steroid prescription as prescribed.  She has completed antibiotics. 6.  Anemia: Resolved. 7.  Renal insufficiency: Resolved.  Continue weekly IV fluids. 8.  Diarrhea: Recommended OTC Imodium as needed.   Patient expressed understanding and was in agreement with this plan. She also understands that She can call clinic at any time with any questions, concerns, or complaints.   Cancer Staging Carcinoma of upper-inner quadrant of left female breast [facility]) Staging form: Breast, AJCC 8th Edition - Clinical stage from 02/01/2020: Stage IB (cT1c, cN0, cM0, G3, ER-, PR-, HER2-) - Signed by Lloyd Huger, MD on 02/01/2020   Lloyd Huger, MD   07/27/2020 9:19 AM

## 2020-07-26 ENCOUNTER — Encounter: Payer: Self-pay | Admitting: Oncology

## 2020-07-26 NOTE — Progress Notes (Signed)
Called patient to assess for oncology follow-up appointment, expresses complaints of diarrhea. Asking for medication refills.

## 2020-07-27 ENCOUNTER — Inpatient Hospital Stay: Payer: Medicaid Other

## 2020-07-27 ENCOUNTER — Other Ambulatory Visit: Payer: Self-pay

## 2020-07-27 ENCOUNTER — Inpatient Hospital Stay (HOSPITAL_BASED_OUTPATIENT_CLINIC_OR_DEPARTMENT_OTHER): Payer: Medicaid Other | Admitting: Oncology

## 2020-07-27 VITALS — BP 113/89 | HR 76 | Temp 96.0°F | Resp 16 | Wt 102.7 lb

## 2020-07-27 DIAGNOSIS — Z171 Estrogen receptor negative status [ER-]: Secondary | ICD-10-CM | POA: Diagnosis not present

## 2020-07-27 DIAGNOSIS — C50212 Malignant neoplasm of upper-inner quadrant of left female breast: Secondary | ICD-10-CM | POA: Diagnosis not present

## 2020-07-27 DIAGNOSIS — Z5111 Encounter for antineoplastic chemotherapy: Secondary | ICD-10-CM | POA: Diagnosis not present

## 2020-07-27 LAB — CBC WITH DIFFERENTIAL/PLATELET
Abs Immature Granulocytes: 0.1 10*3/uL — ABNORMAL HIGH (ref 0.00–0.07)
Basophils Absolute: 0.1 10*3/uL (ref 0.0–0.1)
Basophils Relative: 2 %
Eosinophils Absolute: 0.3 10*3/uL (ref 0.0–0.5)
Eosinophils Relative: 5 %
HCT: 38.9 % (ref 36.0–46.0)
Hemoglobin: 12.8 g/dL (ref 12.0–15.0)
Immature Granulocytes: 2 %
Lymphocytes Relative: 14 %
Lymphs Abs: 0.9 10*3/uL (ref 0.7–4.0)
MCH: 33.6 pg (ref 26.0–34.0)
MCHC: 32.9 g/dL (ref 30.0–36.0)
MCV: 102.1 fL — ABNORMAL HIGH (ref 80.0–100.0)
Monocytes Absolute: 0.5 10*3/uL (ref 0.1–1.0)
Monocytes Relative: 8 %
Neutro Abs: 4.6 10*3/uL (ref 1.7–7.7)
Neutrophils Relative %: 69 %
Platelets: 304 10*3/uL (ref 150–400)
RBC: 3.81 MIL/uL — ABNORMAL LOW (ref 3.87–5.11)
RDW: 13 % (ref 11.5–15.5)
WBC: 6.5 10*3/uL (ref 4.0–10.5)
nRBC: 0 % (ref 0.0–0.2)

## 2020-07-27 LAB — COMPREHENSIVE METABOLIC PANEL
ALT: 13 U/L (ref 0–44)
AST: 19 U/L (ref 15–41)
Albumin: 3.7 g/dL (ref 3.5–5.0)
Alkaline Phosphatase: 86 U/L (ref 38–126)
Anion gap: 8 (ref 5–15)
BUN: 19 mg/dL (ref 6–20)
CO2: 25 mmol/L (ref 22–32)
Calcium: 8.9 mg/dL (ref 8.9–10.3)
Chloride: 106 mmol/L (ref 98–111)
Creatinine, Ser: 0.78 mg/dL (ref 0.44–1.00)
GFR calc Af Amer: >60 mL/min (ref >60)
GFR calc non Af Amer: >60 mL/min (ref >60)
Glucose, Bld: 103 mg/dL — ABNORMAL HIGH (ref 70–99)
Potassium: 4.4 mmol/L (ref 3.5–5.1)
Sodium: 139 mmol/L (ref 135–145)
Total Bilirubin: 0.4 mg/dL (ref 0.3–1.2)
Total Protein: 7.1 g/dL (ref 6.5–8.1)

## 2020-07-27 MED ORDER — SODIUM CHLORIDE 0.9 % IV SOLN
80.0000 mg/m2 | Freq: Once | INTRAVENOUS | Status: AC
  Administered 2020-07-27: 120 mg via INTRAVENOUS
  Filled 2020-07-27: qty 20

## 2020-07-27 MED ORDER — SODIUM CHLORIDE 0.9 % IV SOLN
10.0000 mg | Freq: Once | INTRAVENOUS | Status: AC
  Administered 2020-07-27: 10 mg via INTRAVENOUS
  Filled 2020-07-27: qty 10

## 2020-07-27 MED ORDER — SODIUM CHLORIDE 0.9 % IV SOLN
Freq: Once | INTRAVENOUS | Status: AC
  Filled 2020-07-27: qty 250

## 2020-07-27 MED ORDER — FAMOTIDINE IN NACL 20-0.9 MG/50ML-% IV SOLN
20.0000 mg | Freq: Once | INTRAVENOUS | Status: AC
  Administered 2020-07-27: 20 mg via INTRAVENOUS
  Filled 2020-07-27: qty 50

## 2020-07-27 MED ORDER — SODIUM CHLORIDE 0.9% FLUSH
10.0000 mL | Freq: Once | INTRAVENOUS | Status: AC
  Administered 2020-07-27: 10 mL via INTRAVENOUS
  Filled 2020-07-27: qty 10

## 2020-07-27 MED ORDER — DIPHENHYDRAMINE HCL 50 MG/ML IJ SOLN
25.0000 mg | Freq: Once | INTRAMUSCULAR | Status: AC
  Administered 2020-07-27: 25 mg via INTRAVENOUS
  Filled 2020-07-27: qty 1

## 2020-07-27 MED ORDER — HEPARIN SOD (PORK) LOCK FLUSH 100 UNIT/ML IV SOLN
500.0000 [IU] | Freq: Once | INTRAVENOUS | Status: AC | PRN
  Administered 2020-07-27: 500 [IU]
  Filled 2020-07-27: qty 5

## 2020-07-27 NOTE — Progress Notes (Signed)
  [name]  Telephone:(336) [phone] Fax:(336) [phone]  ID: Deanna Ramsey OB: [birth year]  MR#: [id number]  DPO#:[id number]  Patient Care Team: Letta Median, MD as PCP - General (Family Medicine) Rico Junker, RN as Registered Nurse Theodore Demark, RN as Oncology Nurse Navigator Lloyd Huger, MD as Consulting Physician (Oncology) Anabel Bene, MD as Referring Physician (Neurology)    Lloyd Huger, MD   08/04/2020 8:26 AM     This encounter was created in error - please disregard.

## 2020-07-31 ENCOUNTER — Other Ambulatory Visit: Payer: Self-pay

## 2020-07-31 DIAGNOSIS — Z171 Estrogen receptor negative status [ER-]: Secondary | ICD-10-CM

## 2020-07-31 MED ORDER — LIDOCAINE-PRILOCAINE 2.5-2.5 % EX CREA: TOPICAL_CREAM | CUTANEOUS | 3 refills | Status: DC

## 2020-07-31 MED ORDER — ALPRAZOLAM 0.5 MG PO TABS
0.5000 mg | ORAL_TABLET | Freq: Every evening | ORAL | 0 refills | Status: DC | PRN
Start: 2020-07-31 — End: 2020-09-05

## 2020-07-31 NOTE — Progress Notes (Signed)
Patient phoned requesting assistance with groceries.  Approval for [employer] card submitted.

## 2020-07-31 NOTE — Telephone Encounter (Signed)
Patient called requesting refills on xanax and Emla cream. Routing to provider for approval. Patient's correct pharmacy was verified.

## 2020-08-03 ENCOUNTER — Inpatient Hospital Stay: Payer: Medicaid Other | Admitting: Oncology

## 2020-08-03 ENCOUNTER — Inpatient Hospital Stay: Payer: Medicaid Other

## 2020-08-03 ENCOUNTER — Telehealth: Payer: Self-pay | Admitting: *Deleted

## 2020-08-03 DIAGNOSIS — C50212 Malignant neoplasm of upper-inner quadrant of left female breast: Secondary | ICD-10-CM

## 2020-08-03 NOTE — Telephone Encounter (Signed)
Patient left vm requesting to cancel all clinic appointments today due to her being sick, in her message she stated she has a stomach bug. I tried to reach out to patient to follow up and see if there is anything we can offer and to see if she needs to be seen in [facility]. Left vm for patient to return call if we can help with any symptom control, all clinic appts have been cancelled.

## 2020-08-03 NOTE — Progress Notes (Signed)
Nutrition  RD planning to follow-up with patient today during infusion.  Patient called to cancel all appointments for today as she is sick.    Kevontay Burks B. Zenia Resides, [place], [place] Registered Dietitian [phone] (mobile)

## 2020-08-10 ENCOUNTER — Inpatient Hospital Stay: Payer: Medicaid Other

## 2020-08-10 ENCOUNTER — Other Ambulatory Visit: Payer: Self-pay

## 2020-08-10 ENCOUNTER — Encounter: Payer: Self-pay | Admitting: Oncology

## 2020-08-10 ENCOUNTER — Inpatient Hospital Stay: Payer: Medicaid Other | Attending: Oncology | Admitting: Oncology

## 2020-08-10 VITALS — BP 93/80 | HR 99 | Temp 96.5°F | Resp 20 | Wt 97.1 lb

## 2020-08-10 DIAGNOSIS — Z5111 Encounter for antineoplastic chemotherapy: Secondary | ICD-10-CM | POA: Insufficient documentation

## 2020-08-10 DIAGNOSIS — Z171 Estrogen receptor negative status [ER-]: Secondary | ICD-10-CM | POA: Insufficient documentation

## 2020-08-10 DIAGNOSIS — F17210 Nicotine dependence, cigarettes, uncomplicated: Secondary | ICD-10-CM | POA: Diagnosis not present

## 2020-08-10 DIAGNOSIS — C50212 Malignant neoplasm of upper-inner quadrant of left female breast: Secondary | ICD-10-CM | POA: Diagnosis present

## 2020-08-10 LAB — CBC WITH DIFFERENTIAL/PLATELET
Abs Immature Granulocytes: 0.05 10*3/uL (ref 0.00–0.07)
Basophils Absolute: 0.1 10*3/uL (ref 0.0–0.1)
Basophils Relative: 1 %
Eosinophils Absolute: 0.3 10*3/uL (ref 0.0–0.5)
Eosinophils Relative: 3 %
HCT: 40.1 % (ref 36.0–46.0)
Hemoglobin: 13.7 g/dL (ref 12.0–15.0)
Immature Granulocytes: 1 %
Lymphocytes Relative: 13 %
Lymphs Abs: 1.4 10*3/uL (ref 0.7–4.0)
MCH: 33.2 pg (ref 26.0–34.0)
MCHC: 34.2 g/dL (ref 30.0–36.0)
MCV: 97.1 fL (ref 80.0–100.0)
Monocytes Absolute: 0.9 10*3/uL (ref 0.1–1.0)
Monocytes Relative: 9 %
Neutro Abs: 7.8 10*3/uL — ABNORMAL HIGH (ref 1.7–7.7)
Neutrophils Relative %: 73 %
Platelets: 377 10*3/uL (ref 150–400)
RBC: 4.13 MIL/uL (ref 3.87–5.11)
RDW: 12.7 % (ref 11.5–15.5)
WBC: 10.5 10*3/uL (ref 4.0–10.5)
nRBC: 0 % (ref 0.0–0.2)

## 2020-08-10 LAB — COMPREHENSIVE METABOLIC PANEL
ALT: 10 U/L (ref 0–44)
AST: 19 U/L (ref 15–41)
Albumin: 3.9 g/dL (ref 3.5–5.0)
Alkaline Phosphatase: 86 U/L (ref 38–126)
Anion gap: 15 (ref 5–15)
BUN: 33 mg/dL — ABNORMAL HIGH (ref 6–20)
CO2: 22 mmol/L (ref 22–32)
Calcium: 8.9 mg/dL (ref 8.9–10.3)
Chloride: 97 mmol/L — ABNORMAL LOW (ref 98–111)
Creatinine, Ser: 1.45 mg/dL — ABNORMAL HIGH (ref 0.44–1.00)
GFR calc Af Amer: 48 mL/min — ABNORMAL LOW (ref >60)
GFR calc non Af Amer: 41 mL/min — ABNORMAL LOW (ref >60)
Glucose, Bld: 153 mg/dL — ABNORMAL HIGH (ref 70–99)
Potassium: 3.9 mmol/L (ref 3.5–5.1)
Sodium: 134 mmol/L — ABNORMAL LOW (ref 135–145)
Total Bilirubin: 0.2 mg/dL — ABNORMAL LOW (ref 0.3–1.2)
Total Protein: 7.2 g/dL (ref 6.5–8.1)

## 2020-08-10 MED ORDER — DIPHENHYDRAMINE HCL 50 MG/ML IJ SOLN
25.0000 mg | Freq: Once | INTRAMUSCULAR | Status: AC
  Administered 2020-08-10: 25 mg via INTRAVENOUS
  Filled 2020-08-10: qty 1

## 2020-08-10 MED ORDER — FAMOTIDINE IN NACL 20-0.9 MG/50ML-% IV SOLN
20.0000 mg | Freq: Once | INTRAVENOUS | Status: AC
  Administered 2020-08-10: 20 mg via INTRAVENOUS
  Filled 2020-08-10: qty 50

## 2020-08-10 MED ORDER — HEPARIN SOD (PORK) LOCK FLUSH 100 UNIT/ML IV SOLN
500.0000 [IU] | Freq: Once | INTRAVENOUS | Status: AC | PRN
  Administered 2020-08-10: 500 [IU]
  Filled 2020-08-10: qty 5

## 2020-08-10 MED ORDER — SODIUM CHLORIDE 0.9 % IV SOLN
10.0000 mg | Freq: Once | INTRAVENOUS | Status: AC
  Administered 2020-08-10: 10 mg via INTRAVENOUS
  Filled 2020-08-10: qty 10

## 2020-08-10 MED ORDER — SODIUM CHLORIDE 0.9 % IV SOLN
80.0000 mg/m2 | Freq: Once | INTRAVENOUS | Status: AC
  Administered 2020-08-10: 120 mg via INTRAVENOUS
  Filled 2020-08-10: qty 20

## 2020-08-10 MED ORDER — HEPARIN SOD (PORK) LOCK FLUSH 100 UNIT/ML IV SOLN
INTRAVENOUS | Status: AC
  Filled 2020-08-10: qty 5

## 2020-08-10 MED ORDER — SODIUM CHLORIDE 0.9 % IV SOLN
Freq: Once | INTRAVENOUS | Status: AC
  Filled 2020-08-10: qty 250

## 2020-08-10 NOTE — Progress Notes (Signed)
Patient denies any concerns today.  

## 2020-08-10 NOTE — Progress Notes (Signed)
Nutrition Follow-up:  Patient with triple negative breast cancer followed by Dr. [name].  Patient receiving Taxol.  Spoke with patient during infusion today.  RD unable to gather much information as patient dropping off to sleep during meeting.  Patient asking for more ensure.  Says that she is only able to drink one a day because if she drinks more will cause diarrhea.  Unable to tell RD what she has been eating.  Noted recent "GI bug".     Medications: reviewed  Labs: reviewed  Anthropometrics:   Weight 97 lb today decreased from 101 lb on 7/29   NUTRITION DIAGNOSIS: Inadequate oral intake continues   INTERVENTION:  Provided patient complimentary case of ensure enlive.  Also provided samples of orgain and [employer] shake for patient to try (?? Help with diarrhea) Voiced concern over weight loss.  Encouraged increase calories and protein.      MONITORING, EVALUATION, GOAL: weight loss, intake   NEXT VISIT: as needed  Lamont Glasscock B. Zenia Resides, [place], [place] Registered Dietitian [phone] (mobile)

## 2020-08-10 NOTE — Progress Notes (Signed)
[place]  [phone] [phone]) [phone]  ID: Deanna Ramsey OB: Apr 22, 1966  MR#: [id number]  STM#:[id number]  Patient Care Team: Letta Median, MD as PCP - General (Family Medicine) Rico Junker, RN as Registered Nurse Theodore Demark, RN as Oncology Nurse Navigator Lloyd Huger, MD as Consulting Physician (Oncology) Anabel Bene, MD as Referring Physician (Neurology)  I connected with Deanna Ramsey on 08/10/20 at  8:45 AM EDT by video enabled telemedicine visit and verified that I am speaking with the correct person using two identifiers.   I discussed the limitations, risks, security and privacy concerns of performing an evaluation and management service by telemedicine and the availability of in-person appointments. I also discussed with the patient that there may be a patient responsible charge related to this service. The patient expressed understanding and agreed to proceed.   Other persons participating in the visit and their role in the encounter: Patient, MD.  Patients location: Clinic. Providers location: Home.  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  INTERVAL HISTORY: Patient agreed to video assisted telemedicine visit for further evaluation and consideration of cycle 10 of weekly Taxol.  She missed her treatment last week secondary to a "GI bug".  She admits to decreased fluid intake this past week, but otherwise feels well and back to her baseline. She continues to have chronic weakness and fatigue. She does not complain of headache or migraine this week.  She has no neurologic complaints.  She has a fair appetite.  She has no chest pain, shortness of breath, or hemoptysis.  She denies any nausea, vomiting, constipation, or diarrhea.  She has no urinary complaints.  Patient offers no further specific complaints today.  REVIEW OF SYSTEMS:   Review  of Systems  Constitutional: Positive for malaise/fatigue. Negative for fever and weight loss.  HENT: Negative for congestion.   Respiratory: Negative.  Negative for cough, hemoptysis and shortness of breath.   Cardiovascular: Negative.  Negative for chest pain and leg swelling.  Gastrointestinal: Negative.  Negative for abdominal pain, diarrhea and nausea.  Genitourinary: Negative.  Negative for dysuria.  Musculoskeletal: Negative.  Negative for back pain.  Skin: Negative.  Negative for rash.  Neurological: Positive for weakness. Negative for dizziness, focal weakness and headaches.  Psychiatric/Behavioral: Negative.  The patient is not nervous/anxious and does not have insomnia.     As per HPI. Otherwise, a complete review of systems is negative.  PAST MEDICAL HISTORY: Past Medical History:  Diagnosis Date   Arthritis    Cellulitis    COPD (chronic obstructive pulmonary disease) ([place])    Depression    History of kidney stones    History of tracheostomy 2000   stated that epiglottis swelled up for an unknown reason; had trach for about a week.    Hypertension    Pneumonia     PAST SURGICAL HISTORY: Past Surgical History:  Procedure Laterality Date   arm surgery  1984   fractured ulna   BREAST LUMPECTOMY WITH SENTINEL LYMPH NODE BIOPSY Left 02/04/2020   Procedure: BREAST LUMPECTOMY WITH SENTINEL LYMPH NODE BX;  Surgeon: Jules Husbands, MD;  Location: [facility] ORS;  Service: General;  Laterality: Left;   CLEFT LIP REPAIR     FRACTURE SURGERY     kidney stones     lymphnode neck     PORTACATH PLACEMENT N/A 02/04/2020   Procedure: INSERTION PORT-A-CATH;  Surgeon:  Caroleen Hamman F, MD;  Location: [facility] ORS;  Service: General;  Laterality: N/A;   RE-EXCISION OF BREAST LUMPECTOMY Left 02/24/2020   Procedure: RE-EXCISION OF Left BREAST LUMPECTOMY;  Surgeon: Jules Husbands, MD;  Location: [facility] ORS;  Service: General;  Laterality: Left;   TYMPANOSTOMY TUBE PLACEMENT       FAMILY HISTORY: Family History  Problem Relation Age of Onset   COPD Other    COPD Mother    Hypertension Mother    COPD Father    Breast cancer Neg Hx     ADVANCED DIRECTIVES (Y/N):  N  HEALTH MAINTENANCE: Social History   Tobacco Use   Smoking status: Current Every Day Smoker    Packs/day: 1.00    Types: Cigarettes   Smokeless tobacco: Never Used  Vaping Use   Vaping Use: Never used  Substance Use Topics   Alcohol use: No   Drug use: No     Colonoscopy:  PAP:  Bone density:  Lipid panel:  Allergies  Allergen Reactions   Sulfa Antibiotics Nausea And Vomiting    Current Outpatient Medications  Medication Sig Dispense Refill   acetaminophen (TYLENOL) 325 MG tablet Take 650 mg by mouth every 6 (six) hours as needed for moderate pain.     ALPRAZolam (XANAX) 0.5 MG tablet Take 1 tablet (0.5 mg total) by mouth at bedtime as needed for anxiety. 30 tablet 0   lidocaine-prilocaine (EMLA) cream Apply to affected area once 30 g 3   lisinopril-hydrochlorothiazide (ZESTORETIC) 20-25 MG tablet Take 1 tablet by mouth daily.     ondansetron (ZOFRAN) 4 MG tablet Take 4 mg by mouth every 8 (eight) hours as needed for nausea or vomiting.     prochlorperazine (COMPAZINE) 10 MG tablet Take 1 tablet (10 mg total) by mouth every 6 (six) hours as needed (Nausea or vomiting). 60 tablet 2   albuterol (PROVENTIL HFA;VENTOLIN HFA) 108 (90 Base) MCG/ACT inhaler Inhale 2 puffs into the lungs every 6 (six) hours as needed for wheezing or shortness of breath. (Patient not taking: Reported on 07/20/2020) 1 Inhaler 2   butalbital-acetaminophen-caffeine (FIORICET) 50-325-40 MG tablet Take 1-2 tablets by mouth every 6 (six) hours as needed for headache. Do not exceed 6 tablets in 24 hour period. (Patient not taking: Reported on 07/26/2020) 20 tablet 0   cetirizine (ZYRTEC) 10 MG tablet Take 10 mg by mouth daily. (Patient not taking: Reported on 07/20/2020)      chlorpheniramine-HYDROcodone (TUSSIONEX) 10-8 MG/5ML SUER Take 5 mLs by mouth every 12 (twelve) hours as needed for cough. (Patient not taking: Reported on 07/20/2020) 140 mL 0   ciprofloxacin (CILOXAN) 0.3 % ophthalmic solution Place 2 drops into the left eye every 4 (four) hours while awake. Administer 1 drop, every 2 hours, while awake, for 2 days. Then 1 drop, every 4 hours, while awake, for the next 5 days. (Patient not taking: Reported on 07/20/2020) 5 mL 1   fluticasone (FLOVENT HFA) 110 MCG/ACT inhaler Inhale 2 puffs into the lungs daily.  (Patient not taking: Reported on 07/20/2020)     guaiFENesin (MUCINEX) 600 MG 12 hr tablet Take 600 mg by mouth 2 (two) times daily. (Patient not taking: Reported on 07/20/2020)     mupirocin ointment (BACTROBAN) 2 % Place 1 application into the nose 2 (two) times daily. (Patient not taking: Reported on 07/20/2020) 22 g 0   No current facility-administered medications for this visit.   Facility-Administered Medications Ordered in Other Visits  Medication Dose Route Frequency Provider  Last Rate Last Admin   0.9 %  sodium chloride infusion   Intravenous Once [name], [name], MD       0.9 %  sodium chloride infusion   Intravenous Once [name], [name], MD       dexamethasone (DECADRON) 10 mg in sodium chloride 0.9 % 50 mL IVPB  10 mg Intravenous Once Lloyd Huger, MD       diphenhydrAMINE (BENADRYL) injection 25 mg  25 mg Intravenous Once Lloyd Huger, MD       famotidine (PEPCID) IVPB 20 mg premix  20 mg Intravenous Once Lloyd Huger, MD       heparin lock flush 100 unit/mL  500 Units Intracatheter Once PRN Lloyd Huger, MD       PACLitaxel (TAXOL) 120 mg in sodium chloride 0.9 % 250 mL chemo infusion (</= 29m/m2)  80 mg/m2 (Treatment Plan Recorded) Intravenous Once FLloyd Huger MD       sodium chloride flush (NS) 0.9 % injection 10 mL  10 mL Intravenous PRN FLloyd Huger MD   10 mL at 06/01/20 0905      OBJECTIVE: Vitals:   08/10/20 0830  BP: 93/80  Pulse: 99  Resp: 20  Temp: (!) 96.5 F (35.8 C)     Body mass index is 15.91 kg/m.    ECOG FS:0 - Asymptomatic  General: Well-developed, well-nourished, no acute distress. HEENT: Normocephalic. Neuro: Alert, answering all questions appropriately. Cranial nerves grossly intact. Psych: Normal affect.   LAB RESULTS:  Lab Results  Component Value Date   NA 134 (L) 08/10/2020   K 3.9 08/10/2020   CL 97 (L) 08/10/2020   CO2 22 08/10/2020   GLUCOSE 153 (H) 08/10/2020   BUN 33 (H) 08/10/2020   CREATININE 1.45 (H) 08/10/2020   CALCIUM 8.9 08/10/2020   PROT 7.2 08/10/2020   ALBUMIN 3.9 08/10/2020   AST 19 08/10/2020   ALT 10 08/10/2020   ALKPHOS 86 08/10/2020   BILITOT 0.2 (L) 08/10/2020   GFRNONAA 41 (L) 08/10/2020   GFRAA 48 (L) 08/10/2020    Lab Results  Component Value Date   WBC 10.5 08/10/2020   NEUTROABS 7.8 (H) 08/10/2020   HGB 13.7 08/10/2020   HCT 40.1 08/10/2020   MCV 97.1 08/10/2020   PLT 377 08/10/2020     STUDIES: No results found.  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  PLAN:    1.  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  Because of positive margins she underwent reexcision on February 24, 2020 with clear margins.  She has now had a port placed as well.  MUGA scan from March 22, 2020 reported an EF of 72%. Given the triple negative status of her disease, she will receive adjuvant chemotherapy using Adriamycin and Cytoxan with Udenyca support followed by weekly Taxol x12.  Patient will also require adjuvant XRT at the conclusion of her chemotherapy.  An aromatase inhibitor would not offer any benefit given the triple negative status of her disease.  Genetics referral has been placed.  Patient completed 4 cycles of  Adriamycin and Cytoxan on May 04, 2020.  Proceed with cycle 10 of 12 of weekly Taxol today.  Return to clinic in 1 week for further evaluation and consideration of cycle 11.  A referral was made to radiation oncology as  well. Patient also receives 1 L of IV fluids with every treatment. 2.  Anxiety/insomnia: Continue Xanax 0.5 mg at night as needed.   Patient expressed understanding given the conclusion of her treatments further management should be transitioned to her primary care physician. 3.  Migraines: Continue Fioricet as needed.  Patient was previously given a referral to neurology for further evaluation. 4.  Depression: Improved.  Patient declined initiation of antidepressant medication or referral for counseling. 5.  Cough: Chronic and unchanged.  Previously, chest x-ray reviewed independently with no obvious pathology.  Patient has been instructed to continue her her inhalers as prescribed.  She has completed antibiotics. 6.  Anemia: Resolved. 7.  Renal insufficiency: Creatinine is trended up slightly to 1.45.  Encourage increased fluid intake and will continue weekly IV fluids. 8.  Diarrhea: Resolved. OTC Imodium as needed.  I provided 30 minutes of face-to-face video visit time during this encounter which included chart review, counseling, and coordination of care as documented above.    Patient expressed understanding and was in agreement with this plan. She also understands that She can call clinic at any time with any questions, concerns, or complaints.   Cancer Staging Carcinoma of upper-inner quadrant of left female breast [facility]) Staging form: Breast, AJCC 8th Edition - Clinical stage from 02/01/2020: Stage IB (cT1c, cN0, cM0, G3, ER-, PR-, HER2-) - Signed by Lloyd Huger, MD on 02/01/2020   Lloyd Huger, MD   08/10/2020 9:20 AM

## 2020-08-12 NOTE — Progress Notes (Signed)
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  INTERVAL HISTORY: Patient returns to clinic today for further evaluation and consideration of cycle 11 of weekly Taxol.  She continues to have chronic weakness and fatigue, but otherwise feels well.  She has no neurologic complaints.  She has a fair appetite.  She has no chest pain, shortness of breath, or hemoptysis.  She denies any nausea, vomiting, constipation, or diarrhea.  She has no urinary complaints.  Patient offers no further specific complaints today.  REVIEW OF SYSTEMS:   Review of Systems  Constitutional: Positive for malaise/fatigue. Negative for fever and weight loss.  HENT: Negative for congestion.   Respiratory: Negative.  Negative for cough, hemoptysis and shortness of breath.   Cardiovascular: Negative.  Negative for chest pain and leg swelling.  Gastrointestinal: Negative.  Negative for abdominal pain, diarrhea and nausea.  Genitourinary: Negative.  Negative for dysuria.  Musculoskeletal: Negative.  Negative for back pain.  Skin: Negative.  Negative for rash.  Neurological: Positive for weakness. Negative for dizziness, focal weakness and headaches.  Psychiatric/Behavioral: Negative.  The patient is not nervous/anxious and does not have insomnia.     As per HPI. Otherwise, a complete review of systems is negative.  PAST MEDICAL HISTORY: Past Medical History:  Diagnosis Date  . Arthritis   . Cellulitis   . COPD (chronic  obstructive pulmonary disease) ([place])   . Depression   . History of kidney stones   . History of tracheostomy 2000   stated that epiglottis swelled up for an unknown reason; had trach for about a week.   . Hypertension   . Pneumonia     PAST SURGICAL HISTORY: Past Surgical History:  Procedure Laterality Date  . arm surgery  1984   fractured ulna  . BREAST LUMPECTOMY WITH SENTINEL LYMPH NODE BIOPSY Left 02/04/2020   Procedure: BREAST LUMPECTOMY WITH SENTINEL LYMPH NODE BX;  Surgeon: Jules Husbands, MD;  Location: [facility] ORS;  Service: General;  Laterality: Left;  . CLEFT LIP REPAIR    . FRACTURE SURGERY    . kidney stones    . lymphnode neck    . PORTACATH PLACEMENT N/A 02/04/2020   Procedure: INSERTION PORT-A-CATH;  Surgeon: Jules Husbands, MD;  Location: [facility] ORS;  Service: General;  Laterality: N/A;  . RE-EXCISION OF BREAST LUMPECTOMY Left 02/24/2020   Procedure: RE-EXCISION OF Left BREAST LUMPECTOMY;  Surgeon: Jules Husbands, MD;  Location: [facility] ORS;  Service: General;  Laterality: Left;  . TYMPANOSTOMY TUBE PLACEMENT      FAMILY HISTORY: Family History  Problem Relation Age of Onset  . COPD Other   . COPD Mother   . Hypertension Mother   . COPD Father   . Breast cancer Neg Hx     ADVANCED DIRECTIVES (Y/N):  N  HEALTH MAINTENANCE: Social History   Tobacco Use  . Smoking status: Current Every Day Smoker    Packs/day: 1.00    Types: Cigarettes  . Smokeless tobacco: Never Used  Vaping Use  . Vaping Use: Never used  Substance Use Topics  . Alcohol use: No  . Drug use: No     Colonoscopy:  PAP:  Bone density:  Lipid panel:  Allergies  Allergen Reactions  . Sulfa Antibiotics Nausea And Vomiting    Current Outpatient Medications  Medication Sig Dispense Refill  . acetaminophen (TYLENOL) 325 MG tablet Take 650 mg by mouth every 6 (six) hours as needed for moderate pain.    Marland Kitchen albuterol (PROVENTIL HFA;VENTOLIN HFA) 108 (90 Base) MCG/ACT inhaler Inhale 2 puffs  into the lungs every 6 (six) hours as needed for wheezing or shortness of breath. (Patient not taking: Reported on 07/20/2020) 1 Inhaler 2  . ALPRAZolam (XANAX) 0.5 MG tablet Take 1 tablet (0.5 mg total) by mouth at bedtime as needed for anxiety. 30 tablet 0  . butalbital-acetaminophen-caffeine (FIORICET) 50-325-40 MG tablet Take 1-2 tablets by mouth every 6 (six) hours as needed for headache. Do not exceed 6 tablets in 24 hour period. (Patient not taking: Reported on 07/26/2020) 20 tablet 0  . cetirizine (ZYRTEC) 10 MG tablet Take 10 mg by mouth daily. (Patient not taking: Reported on 07/20/2020)    . chlorpheniramine-HYDROcodone (TUSSIONEX) 10-8 MG/5ML SUER Take 5 mLs by mouth every 12 (twelve) hours as needed for cough. (Patient not taking: Reported on 07/20/2020) 140 mL 0  . ciprofloxacin (CILOXAN) 0.3 % ophthalmic solution Place 2 drops into the left eye every 4 (four) hours while awake. Administer 1 drop, every 2 hours, while awake, for 2 days. Then 1 drop, every 4 hours, while awake, for the next 5 days. (Patient not taking: Reported on 07/20/2020) 5 mL 1  . fluticasone (FLOVENT HFA) 110 MCG/ACT inhaler Inhale 2 puffs into the lungs daily.  (Patient not taking: Reported on 07/20/2020)    . guaiFENesin (MUCINEX) 600 MG 12 hr tablet Take 600 mg by mouth 2 (two) times daily. (Patient not taking: Reported on 07/20/2020)    . lidocaine-prilocaine (EMLA) cream Apply to affected area once 30 g 3  . lisinopril-hydrochlorothiazide (ZESTORETIC) 20-25 MG tablet Take 1 tablet by mouth daily.    . mupirocin ointment (BACTROBAN) 2 % Place 1 application into the nose 2 (two) times daily. (Patient not taking: Reported on 07/20/2020) 22 g 0  . ondansetron (ZOFRAN) 4 MG tablet Take 4 mg by mouth every 8 (eight) hours as needed for nausea or vomiting.    . prochlorperazine (COMPAZINE) 10 MG tablet Take 1 tablet (10 mg total) by mouth every 6 (six) hours as needed (Nausea or vomiting). 60 tablet 2   No current  facility-administered medications for this visit.   Facility-Administered Medications Ordered in Other Visits  Medication Dose Route Frequency Provider Last Rate Last Admin  . sodium chloride flush (NS) 0.9 % injection 10 mL  10 mL Intravenous PRN Lloyd Huger, MD   10 mL at 06/01/20 6712    OBJECTIVE: There were no vitals filed for this visit.   There is no height or weight on file to calculate BMI.    ECOG FS:0 - Asymptomatic  General: Well-developed, well-nourished, no acute distress. Eyes: Pink conjunctiva, anicteric sclera. HEENT: Normocephalic, moist mucous membranes. Lungs: No audible wheezing or coughing. Heart: Regular rate and rhythm. Abdomen: Soft, nontender, no obvious distention. Musculoskeletal: No edema, cyanosis, or clubbing. Neuro: Alert, answering all questions appropriately. Cranial nerves grossly intact. Skin: No rashes or petechiae noted. Psych: Normal affect.  LAB RESULTS:  Lab Results  Component Value Date   NA 137 08/17/2020   K 3.8 08/17/2020  CL 99 08/17/2020   CO2 25 08/17/2020   GLUCOSE 144 (H) 08/17/2020   BUN 30 (H) 08/17/2020   CREATININE 1.44 (H) 08/17/2020   CALCIUM 8.8 (L) 08/17/2020   PROT 7.1 08/17/2020   ALBUMIN 3.8 08/17/2020   AST 20 08/17/2020   ALT 12 08/17/2020   ALKPHOS 77 08/17/2020   BILITOT 0.5 08/17/2020   GFRNONAA 41 (L) 08/17/2020   GFRAA 48 (L) 08/17/2020    Lab Results  Component Value Date   WBC 7.0 08/17/2020   NEUTROABS 4.7 08/17/2020   HGB 13.9 08/17/2020   HCT 40.0 08/17/2020   MCV 96.9 08/17/2020   PLT 351 08/17/2020     STUDIES: No results found.  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  PLAN:    1.  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  Because of positive margins she underwent reexcision on February 24, 2020  with clear margins.  She has now had a port placed as well.  MUGA scan from March 22, 2020 reported an EF of 72%. Given the triple negative status of her disease, she will receive adjuvant chemotherapy using Adriamycin and Cytoxan with Udenyca support followed by weekly Taxol x12.  Patient will also require adjuvant XRT at the conclusion of her chemotherapy.  An aromatase inhibitor would not offer any benefit given the triple negative status of her disease.  Genetics referral has been placed.  Patient completed 4 cycles of Adriamycin and Cytoxan on May 04, 2020.  Proceed with cycle 11 of 12 of weekly Taxol today.  Return to clinic in 1 week for further evaluation and her final infusion of Taxol.  Patient has consultation with radiation oncology next week.  She also receives 1 L of IV fluids with every treatment. 2.  Anxiety/insomnia: Continue Xanax 0.5 mg at night as needed.   Patient expressed understanding given the conclusion of her treatments further management should be transitioned to her primary care physician. 3.  Migraines: Patient does not complain of this today.  Continue Fioricet as needed.  Patient was previously given a referral to neurology for further evaluation. 4.  Depression: Improved.  Patient declined initiation of antidepressant medication or referral for counseling. 5.  Cough: Chronic and unchanged.  Previously, chest x-ray reviewed independently with no obvious pathology.  Patient has been instructed to continue her her inhalers as prescribed. 6.  Anemia: Resolved. 7.  Renal insufficiency: Creatinine remains mildly elevated at 1.44, but unchanged.  Encourage increased fluid intake and will continue weekly IV fluids. 8.  Diarrhea: Resolved. OTC Imodium as needed.    Patient expressed understanding and was in agreement with this plan. She also understands that She can call clinic at any time with any questions, concerns, or complaints.   Cancer Staging Carcinoma of upper-inner  quadrant of left female breast [facility]) Staging form: Breast, AJCC 8th Edition - Clinical stage from 02/01/2020: Stage IB (cT1c, cN0, cM0, G3, ER-, PR-, HER2-) - Signed by Lloyd Huger, MD on 02/01/2020   Lloyd Huger, MD   08/17/2020 6:43 PM

## 2020-08-17 ENCOUNTER — Inpatient Hospital Stay (HOSPITAL_BASED_OUTPATIENT_CLINIC_OR_DEPARTMENT_OTHER): Payer: Medicaid Other | Admitting: Oncology

## 2020-08-17 ENCOUNTER — Inpatient Hospital Stay: Payer: Medicaid Other

## 2020-08-17 ENCOUNTER — Other Ambulatory Visit: Payer: Self-pay

## 2020-08-17 VITALS — BP 89/60 | HR 86 | Temp 97.1°F | Resp 18 | Wt 97.0 lb

## 2020-08-17 DIAGNOSIS — C50212 Malignant neoplasm of upper-inner quadrant of left female breast: Secondary | ICD-10-CM

## 2020-08-17 DIAGNOSIS — Z171 Estrogen receptor negative status [ER-]: Secondary | ICD-10-CM

## 2020-08-17 DIAGNOSIS — Z5111 Encounter for antineoplastic chemotherapy: Secondary | ICD-10-CM | POA: Diagnosis not present

## 2020-08-17 LAB — COMPREHENSIVE METABOLIC PANEL
ALT: 12 U/L (ref 0–44)
AST: 20 U/L (ref 15–41)
Albumin: 3.8 g/dL (ref 3.5–5.0)
Alkaline Phosphatase: 77 U/L (ref 38–126)
Anion gap: 13 (ref 5–15)
BUN: 30 mg/dL — ABNORMAL HIGH (ref 6–20)
CO2: 25 mmol/L (ref 22–32)
Calcium: 8.8 mg/dL — ABNORMAL LOW (ref 8.9–10.3)
Chloride: 99 mmol/L (ref 98–111)
Creatinine, Ser: 1.44 mg/dL — ABNORMAL HIGH (ref 0.44–1.00)
GFR calc Af Amer: 48 mL/min — ABNORMAL LOW (ref >60)
GFR calc non Af Amer: 41 mL/min — ABNORMAL LOW (ref >60)
Glucose, Bld: 144 mg/dL — ABNORMAL HIGH (ref 70–99)
Potassium: 3.8 mmol/L (ref 3.5–5.1)
Sodium: 137 mmol/L (ref 135–145)
Total Bilirubin: 0.5 mg/dL (ref 0.3–1.2)
Total Protein: 7.1 g/dL (ref 6.5–8.1)

## 2020-08-17 LAB — CBC WITH DIFFERENTIAL/PLATELET
Abs Immature Granulocytes: 0.07 10*3/uL (ref 0.00–0.07)
Basophils Absolute: 0.1 10*3/uL (ref 0.0–0.1)
Basophils Relative: 1 %
Eosinophils Absolute: 0.3 10*3/uL (ref 0.0–0.5)
Eosinophils Relative: 4 %
HCT: 40 % (ref 36.0–46.0)
Hemoglobin: 13.9 g/dL (ref 12.0–15.0)
Immature Granulocytes: 1 %
Lymphocytes Relative: 19 %
Lymphs Abs: 1.3 10*3/uL (ref 0.7–4.0)
MCH: 33.7 pg (ref 26.0–34.0)
MCHC: 34.8 g/dL (ref 30.0–36.0)
MCV: 96.9 fL (ref 80.0–100.0)
Monocytes Absolute: 0.5 10*3/uL (ref 0.1–1.0)
Monocytes Relative: 7 %
Neutro Abs: 4.7 10*3/uL (ref 1.7–7.7)
Neutrophils Relative %: 68 %
Platelets: 351 10*3/uL (ref 150–400)
RBC: 4.13 MIL/uL (ref 3.87–5.11)
RDW: 12.9 % (ref 11.5–15.5)
WBC: 7 10*3/uL (ref 4.0–10.5)
nRBC: 0 % (ref 0.0–0.2)

## 2020-08-17 MED ORDER — HEPARIN SOD (PORK) LOCK FLUSH 100 UNIT/ML IV SOLN
500.0000 [IU] | Freq: Once | INTRAVENOUS | Status: AC | PRN
  Administered 2020-08-17: 500 [IU]
  Filled 2020-08-17: qty 5

## 2020-08-17 MED ORDER — SODIUM CHLORIDE 0.9 % IV SOLN
10.0000 mg | Freq: Once | INTRAVENOUS | Status: AC
  Administered 2020-08-17: 10 mg via INTRAVENOUS
  Filled 2020-08-17: qty 10

## 2020-08-17 MED ORDER — FAMOTIDINE IN NACL 20-0.9 MG/50ML-% IV SOLN
20.0000 mg | Freq: Once | INTRAVENOUS | Status: AC
  Administered 2020-08-17: 20 mg via INTRAVENOUS
  Filled 2020-08-17: qty 50

## 2020-08-17 MED ORDER — SODIUM CHLORIDE 0.9 % IV SOLN
80.0000 mg/m2 | Freq: Once | INTRAVENOUS | Status: AC
  Administered 2020-08-17: 120 mg via INTRAVENOUS
  Filled 2020-08-17: qty 20

## 2020-08-17 MED ORDER — HEPARIN SOD (PORK) LOCK FLUSH 100 UNIT/ML IV SOLN
INTRAVENOUS | Status: AC
  Filled 2020-08-17: qty 5

## 2020-08-17 MED ORDER — DIPHENHYDRAMINE HCL 50 MG/ML IJ SOLN
25.0000 mg | Freq: Once | INTRAMUSCULAR | Status: AC
  Administered 2020-08-17: 25 mg via INTRAVENOUS
  Filled 2020-08-17: qty 1

## 2020-08-17 MED ORDER — SODIUM CHLORIDE 0.9 % IV SOLN
Freq: Once | INTRAVENOUS | Status: AC
  Filled 2020-08-17: qty 250

## 2020-08-20 NOTE — Progress Notes (Signed)
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  INTERVAL HISTORY: Patient returns to clinic today for further evaluation and consideration of cycle 12 of 12 of weekly Taxol.  She continues to have chronic weakness and fatigue, but otherwise feels well.  She has no neurologic complaints.  She has a fair appetite.  She has no chest pain, shortness of breath, or hemoptysis.  She denies any nausea, vomiting, constipation, or diarrhea.  She has no urinary complaints.  Patient offers no further specific complaints today.  REVIEW OF SYSTEMS:   Review of Systems  Constitutional: Positive for malaise/fatigue. Negative for fever and weight loss.  HENT: Negative for congestion.   Respiratory: Negative.  Negative for cough, hemoptysis and shortness of breath.   Cardiovascular: Negative.  Negative for chest pain and leg swelling.  Gastrointestinal: Negative.  Negative for abdominal pain, diarrhea and nausea.  Genitourinary: Negative.  Negative for dysuria.  Musculoskeletal: Negative.  Negative for back pain.  Skin: Negative.  Negative for rash.  Neurological: Positive for weakness. Negative for dizziness, focal weakness and headaches.  Psychiatric/Behavioral: Negative.  The patient is not nervous/anxious and does not have insomnia.     As per HPI. Otherwise, a complete review of systems is negative.  PAST MEDICAL HISTORY: Past Medical  History:  Diagnosis Date  . Arthritis   . Breast cancer ([place]) 01/2020   left breast triple neagative IMC, HG DCIS  . Cellulitis   . COPD (chronic obstructive pulmonary disease) ([place])   . Depression   . History of kidney stones   . History of tracheostomy 2000   stated that epiglottis swelled up for an unknown reason; had trach for about a week.   . Hypertension   . Personal history of chemotherapy 2021   current  . Pneumonia     PAST SURGICAL HISTORY: Past Surgical History:  Procedure Laterality Date  . arm surgery  1984   fractured ulna  . BREAST BIOPSY Left 01/2020   [place] 3:00 10cmfn [place] bx  . BREAST LUMPECTOMY Left 02/04/2020   triple negative, IMC and HG DCIS with positive margins,  6 LN's negative   . BREAST LUMPECTOMY Left 02/24/2020   re excision for margins  . BREAST LUMPECTOMY WITH SENTINEL LYMPH NODE BIOPSY Left 02/04/2020   Procedure: BREAST LUMPECTOMY WITH SENTINEL LYMPH NODE BX;  Surgeon: Jules Husbands, MD;  Location: [facility] ORS;  Service: General;  Laterality: Left;  . CLEFT LIP REPAIR    . FRACTURE SURGERY    . kidney stones    . lymphnode neck    . PORTACATH PLACEMENT N/A 02/04/2020   Procedure: INSERTION PORT-A-CATH;  Surgeon: Jules Husbands, MD;  Location: [facility] ORS;  Service: General;  Laterality: N/A;  . RE-EXCISION OF BREAST LUMPECTOMY Left 02/24/2020   Procedure: RE-EXCISION OF Left BREAST LUMPECTOMY;  Surgeon: Jules Husbands, MD;  Location: [facility] ORS;  Service: General;  Laterality: Left;  . TYMPANOSTOMY TUBE PLACEMENT      FAMILY HISTORY: Family History  Problem  Relation Age of Onset  . COPD Other   . COPD Mother   . Hypertension Mother   . COPD Father   . Breast cancer Neg Hx     ADVANCED DIRECTIVES (Y/N):  N  HEALTH MAINTENANCE: Social History   Tobacco Use  . Smoking status: Current Every Day Smoker    Packs/day: 1.00    Types: Cigarettes  . Smokeless tobacco: Never Used  Vaping Use  . Vaping Use: Never used  Substance Use Topics  .  Alcohol use: No  . Drug use: No     Colonoscopy:  PAP:  Bone density:  Lipid panel:  Allergies  Allergen Reactions  . Sulfa Antibiotics Nausea And Vomiting    Current Outpatient Medications  Medication Sig Dispense Refill  . acetaminophen (TYLENOL) 325 MG tablet Take 650 mg by mouth every 6 (six) hours as needed for moderate pain.    Marland Kitchen ALPRAZolam (XANAX) 0.5 MG tablet Take 1 tablet (0.5 mg total) by mouth at bedtime as needed for anxiety. 30 tablet 0  . lidocaine-prilocaine (EMLA) cream Apply to affected area once 30 g 3  . lisinopril-hydrochlorothiazide (ZESTORETIC) 20-25 MG tablet Take 1 tablet by mouth daily.    . ondansetron (ZOFRAN) 4 MG tablet Take 4 mg by mouth every 8 (eight) hours as needed for nausea or vomiting.    . prochlorperazine (COMPAZINE) 10 MG tablet Take 1 tablet (10 mg total) by mouth every 6 (six) hours as needed (Nausea or vomiting). 60 tablet 2  . albuterol (PROVENTIL HFA;VENTOLIN HFA) 108 (90 Base) MCG/ACT inhaler Inhale 2 puffs into the lungs every 6 (six) hours as needed for wheezing or shortness of breath. (Patient not taking: Reported on 07/20/2020) 1 Inhaler 2  . butalbital-acetaminophen-caffeine (FIORICET) 50-325-40 MG tablet Take 1-2 tablets by mouth every 6 (six) hours as needed for headache. Do not exceed 6 tablets in 24 hour period. (Patient not taking: Reported on 07/26/2020) 20 tablet 0  . cetirizine (ZYRTEC) 10 MG tablet Take 10 mg by mouth daily. (Patient not taking: Reported on 07/20/2020)    . chlorpheniramine-HYDROcodone (TUSSIONEX) 10-8 MG/5ML SUER Take 5 mLs by mouth every 12 (twelve) hours as needed for cough. (Patient not taking: Reported on 07/20/2020) 140 mL 0  . ciprofloxacin (CILOXAN) 0.3 % ophthalmic solution Place 2 drops into the left eye every 4 (four) hours while awake. Administer 1 drop, every 2 hours, while awake, for 2 days. Then 1 drop, every 4 hours, while awake, for the next 5 days. (Patient not taking: Reported on 07/20/2020) 5 mL 1    . fluticasone (FLOVENT HFA) 110 MCG/ACT inhaler Inhale 2 puffs into the lungs daily.  (Patient not taking: Reported on 07/20/2020)    . guaiFENesin (MUCINEX) 600 MG 12 hr tablet Take 600 mg by mouth 2 (two) times daily. (Patient not taking: Reported on 07/20/2020)    . mupirocin ointment (BACTROBAN) 2 % Place 1 application into the nose 2 (two) times daily. (Patient not taking: Reported on 07/20/2020) 22 g 0   No current facility-administered medications for this visit.   Facility-Administered Medications Ordered in Other Visits  Medication Dose Route Frequency Provider Last Rate Last Admin  . famotidine (PEPCID) IVPB 20 mg premix  20 mg Intravenous Once Lloyd Huger, MD      . heparin lock flush 100 unit/mL  500 Units Intracatheter Once PRN Lloyd Huger, MD      . PACLitaxel (TAXOL) 120 mg in sodium chloride 0.9 % 250 mL chemo infusion (</=  4m/m2)  80 mg/m2 (Treatment Plan Recorded) Intravenous Once FLloyd Huger MD      . sodium chloride flush (NS) 0.9 % injection 10 mL  10 mL Intravenous PRN FLloyd Huger MD   10 mL at 06/01/20 0905  . sodium chloride flush (NS) 0.9 % injection 10 mL  10 mL Intravenous PRN FLloyd Huger MD   10 mL at 08/24/20 0900    OBJECTIVE: Vitals:   08/24/20 0911  BP: (!) 125/92  Pulse: 81  Temp: (!) 97.5 F (36.4 C)  SpO2: 100%     Body mass index is 16.67 kg/m.    ECOG FS:0 - Asymptomatic  General: Thin, no acute distress. Eyes: Pink conjunctiva, anicteric sclera. HEENT: Normocephalic, moist mucous membranes. Lungs: No audible wheezing or coughing. Heart: Regular rate and rhythm. Abdomen: Soft, nontender, no obvious distention. Musculoskeletal: No edema, cyanosis, or clubbing. Neuro: Alert, answering all questions appropriately. Cranial nerves grossly intact. Skin: No rashes or petechiae noted. Psych: Normal affect.  LAB RESULTS:  Lab Results  Component Value Date   NA 136 08/24/2020   K 4.4 08/24/2020   CL 105  08/24/2020   CO2 24 08/24/2020   GLUCOSE 93 08/24/2020   BUN 22 (H) 08/24/2020   CREATININE 0.57 08/24/2020   CALCIUM 8.7 (L) 08/24/2020   PROT 6.5 08/24/2020   ALBUMIN 3.4 (L) 08/24/2020   AST 16 08/24/2020   ALT 12 08/24/2020   ALKPHOS 75 08/24/2020   BILITOT 0.5 08/24/2020   GFRNONAA >60 08/24/2020   GFRAA >60 08/24/2020    Lab Results  Component Value Date   WBC 7.8 08/24/2020   NEUTROABS 5.2 08/24/2020   HGB 12.8 08/24/2020   HCT 38.1 08/24/2020   MCV 97.4 08/24/2020   PLT 328 08/24/2020     STUDIES: [place] UN[place] LEFT INC AXILLA  Result Date: 08/23/2020 CLINICAL DATA:  Status post LEFT lumpectomy in February 2021 for triple negative breast cancer. Patient is currently undergoing chemotherapy. Negative margins on re-excision. Six negative lymph nodes. EXAM: DIGITAL DIAGNOSTIC UNILATERAL LEFT MAMMOGRAM WITH TOMO AND CAD; ULTRASOUND LEFT BREAST LIMITED COMPARISON:  Previous exam(s). ACR Breast Density Category d: The breast tissue is extremely dense, which lowers the sensitivity of mammography. FINDINGS: There are multiple surgical clips seen in the LEFT axilla. There is a an oval mass with circumscribed margins seen in the LEFT axilla, not previously seen on prior mammogram. This likely reflects a lymph node. Given breast density, targeted ultrasound was performed. Mammographic images were processed with CAD. On physical exam, there is a well-healed surgical scar along the lateral aspect of the breast. Targeted ultrasound was performed of the LEFT axilla and LEFT lateral aspect of the breast. No suspicious cystic or solid mass is seen. There is a benign-appearing lymph node in the LEFT axilla with a smooth cortex measuring up to 2 mm in thickness. It demonstrates a normal echogenic hilum. IMPRESSION: No mammographic evidence of malignancy. Breast MRI with and without contrast would be of assistance in evaluating for mammographically occult malignancy given extreme breast  density. RECOMMENDATION: Diagnostic mammogram is recommended in 6 months of bilateral breasts to establish yearly lumpectomy observation. Patient is due in February of 2022. The American Cancer Society recommends annual MRI and mammography in patients with an estimated lifetime risk of developing breast cancer greater than 20 - 25%, or who are known or suspected to be positive for the breast cancer gene. I have discussed the findings and recommendations with the patient. If  applicable, a reminder letter will be sent to the patient regarding the next appointment. BI-RADS CATEGORY  2: Benign. Electronically Signed   By: Valentino Saxon MD   On: 08/23/2020 11:15   MM DIAG BREAST TOMO UNI LEFT  Result Date: 08/23/2020 CLINICAL DATA:  Status post LEFT lumpectomy in February 2021 for triple negative breast cancer. Patient is currently undergoing chemotherapy. Negative margins on re-excision. Six negative lymph nodes. EXAM: DIGITAL DIAGNOSTIC UNILATERAL LEFT MAMMOGRAM WITH TOMO AND CAD; ULTRASOUND LEFT BREAST LIMITED COMPARISON:  Previous exam(s). ACR Breast Density Category d: The breast tissue is extremely dense, which lowers the sensitivity of mammography. FINDINGS: There are multiple surgical clips seen in the LEFT axilla. There is a an oval mass with circumscribed margins seen in the LEFT axilla, not previously seen on prior mammogram. This likely reflects a lymph node. Given breast density, targeted ultrasound was performed. Mammographic images were processed with CAD. On physical exam, there is a well-healed surgical scar along the lateral aspect of the breast. Targeted ultrasound was performed of the LEFT axilla and LEFT lateral aspect of the breast. No suspicious cystic or solid mass is seen. There is a benign-appearing lymph node in the LEFT axilla with a smooth cortex measuring up to 2 mm in thickness. It demonstrates a normal echogenic hilum. IMPRESSION: No mammographic evidence of malignancy. Breast MRI  with and without contrast would be of assistance in evaluating for mammographically occult malignancy given extreme breast density. RECOMMENDATION: Diagnostic mammogram is recommended in 6 months of bilateral breasts to establish yearly lumpectomy observation. Patient is due in February of 2022. The American Cancer Society recommends annual MRI and mammography in patients with an estimated lifetime risk of developing breast cancer greater than 20 - 25%, or who are known or suspected to be positive for the breast cancer gene. I have discussed the findings and recommendations with the patient. If applicable, a reminder letter will be sent to the patient regarding the next appointment. BI-RADS CATEGORY  2: Benign. 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  PLAN:    1.  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  Because of positive margins she underwent reexcision on February 24, 2020 with clear margins.  She has now had a port placed as well.  MUGA scan from March 22, 2020 reported an EF of 72%. Given the triple negative status of her disease, she will receive adjuvant chemotherapy using Adriamycin and Cytoxan with Udenyca support followed by weekly Taxol x12.  Patient will also require adjuvant XRT at the conclusion of her chemotherapy.  An aromatase inhibitor would not offer any benefit given the triple negative status of her disease.  Genetics referral has been placed.  Patient completed 4 cycles of Adriamycin and Cytoxan on May 04, 2020.  Proceed with cycle 12 of 12 of weekly Taxol today.  Patient will initiate her adjuvant XRT in approximately 2 weeks.  Return to clinic at the end of her radiation for further evaluation. 2.  Anxiety/insomnia: Continue Xanax  0.5 mg at night as needed.   Patient expressed understanding given the conclusion of her treatments further management should be transitioned to her primary care physician. 3.  Migraines: Patient does not complain of this today.  Continue Fioricet as needed.  Patient was previously given a referral to neurology for further evaluation. 4.  Depression: Improved.  Patient declined initiation of antidepressant medication or referral for counseling. 5.  Cough: Chronic and unchanged.  Previously, chest x-ray reviewed independently with no obvious pathology.  Patient has been instructed to continue her her inhalers as prescribed. 6.  Anemia: Resolved. 7.  Renal insufficiency: Resolved. Encourage increased fluid intake and will continue weekly IV fluids. 8.  Diarrhea: Resolved. OTC Imodium as needed.  I spent a total of 30 minutes reviewing chart data, face-to-face evaluation with the patient, counseling and coordination of care as detailed above.   Patient expressed understanding and was in agreement with this plan. She also understands that She can call clinic at any time with any questions, concerns, or complaints.   Cancer Staging Carcinoma of upper-inner quadrant of left female breast [facility]) Staging form: Breast, AJCC 8th Edition - Clinical stage from 02/01/2020: Stage IB (cT1c, cN0, cM0, G3, ER-, PR-, HER2-) - Signed by Lloyd Huger, MD on 02/01/2020   Lloyd Huger, MD   08/24/2020 10:13 AM

## 2020-08-21 ENCOUNTER — Encounter: Payer: Self-pay | Admitting: Radiation Oncology

## 2020-08-21 ENCOUNTER — Ambulatory Visit
Admission: RE | Admit: 2020-08-21 | Discharge: 2020-08-21 | Disposition: A | Discharge status: Home / Self Care | Payer: Medicaid Other | Source: Ambulatory Visit | Attending: Radiation Oncology | Admitting: Radiation Oncology

## 2020-08-21 ENCOUNTER — Other Ambulatory Visit: Payer: Self-pay

## 2020-08-21 VITALS — BP 150/100 | HR 102 | Resp 16 | Wt 97.2 lb

## 2020-08-21 DIAGNOSIS — F17210 Nicotine dependence, cigarettes, uncomplicated: Secondary | ICD-10-CM | POA: Diagnosis not present

## 2020-08-21 DIAGNOSIS — M129 Arthropathy, unspecified: Secondary | ICD-10-CM | POA: Insufficient documentation

## 2020-08-21 DIAGNOSIS — I10 Essential (primary) hypertension: Secondary | ICD-10-CM | POA: Insufficient documentation

## 2020-08-21 DIAGNOSIS — Z79899 Other long term (current) drug therapy: Secondary | ICD-10-CM | POA: Insufficient documentation

## 2020-08-21 DIAGNOSIS — C50212 Malignant neoplasm of upper-inner quadrant of left female breast: Secondary | ICD-10-CM | POA: Diagnosis not present

## 2020-08-21 DIAGNOSIS — Z9221 Personal history of antineoplastic chemotherapy: Secondary | ICD-10-CM | POA: Diagnosis not present

## 2020-08-21 DIAGNOSIS — J449 Chronic obstructive pulmonary disease, unspecified: Secondary | ICD-10-CM | POA: Diagnosis not present

## 2020-08-21 DIAGNOSIS — Z171 Estrogen receptor negative status [ER-]: Secondary | ICD-10-CM | POA: Insufficient documentation

## 2020-08-21 DIAGNOSIS — Z87442 Personal history of urinary calculi: Secondary | ICD-10-CM | POA: Diagnosis not present

## 2020-08-21 DIAGNOSIS — F329 Major depressive disorder, single episode, unspecified: Secondary | ICD-10-CM | POA: Insufficient documentation

## 2020-08-21 NOTE — Consult Note (Signed)
NEW PATIENT EVALUATION  Name: Deanna Ramsey  MRN: [id number]  Date:   08/21/2020     DOB: [birth year]   This 54 y.o. 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  Status post chemotherapy and wide local excision and sentinel lymph node biopsy  REFERRING PHYSICIAN: Letta Median, MD  CHIEF COMPLAINT:  Chief Complaint  Patient presents with  . Breast Cancer    Initial consultation    DIAGNOSIS: The encounter diagnosis was Carcinoma of upper-inner quadrant of left breast in female, estrogen receptor negative ([place]).   PREVIOUS INVESTIGATIONS:  Mammogram and ultrasound reviewed Clinical notes reviewed Pathology report reviewed.  HPI: Patient is a 54 year old female who presented to self discovered mass in her left breast.  She was found to have a lesion in the left breast at the 1 o'clock position for which targeted ultrasound was performed showing invasive carcinoma.  She underwent a wide local excision for a 1.5 cm invasive carcinoma of no special type overall grade 3.  Tumor was ER/PR negative HER-2/neu not overexpressed making it a triple negative lesion.  6 sentinel lymph nodes were examined all negative for malignancy.  This was a T1c N0 M0 lesion.  She underwent reexcision showing no evidence of malignancy just changes from previous surgery.  She received Cytoxan Adriamycin followed by 12 cycles of Taxol with the last cycle completing this week.  She has tolerated her treatments well.  She is seen today for radiation oncology opinion.  She specifically denies breast tenderness cough or bone pain.  She is also had a genetics referral.  PLANNED TREATMENT REGIMEN: Left hypofractionated whole breast radiation  PAST MEDICAL HISTORY:  has a past medical history of Arthritis, Cellulitis, COPD (chronic obstructive pulmonary disease) ([place]), Depression, History of kidney stones, History of  tracheostomy (2000), Hypertension, and Pneumonia.    PAST SURGICAL HISTORY:  Past Surgical History:  Procedure Laterality Date  . arm surgery  1984   fractured ulna  . BREAST LUMPECTOMY WITH SENTINEL LYMPH NODE BIOPSY Left 02/04/2020   Procedure: BREAST LUMPECTOMY WITH SENTINEL LYMPH NODE BX;  Surgeon: Jules Husbands, MD;  Location: [facility] ORS;  Service: General;  Laterality: Left;  . CLEFT LIP REPAIR    . FRACTURE SURGERY    . kidney stones    . lymphnode neck    . PORTACATH PLACEMENT N/A 02/04/2020   Procedure: INSERTION PORT-A-CATH;  Surgeon: Jules Husbands, MD;  Location: [facility] ORS;  Service: General;  Laterality: N/A;  . RE-EXCISION OF BREAST LUMPECTOMY Left 02/24/2020   Procedure: RE-EXCISION OF Left BREAST LUMPECTOMY;  Surgeon: Jules Husbands, MD;  Location: [facility] ORS;  Service: General;  Laterality: Left;  . TYMPANOSTOMY TUBE PLACEMENT      FAMILY HISTORY: family history includes COPD in her father, mother, and another family member; Hypertension in her mother.  SOCIAL HISTORY:  reports that she has been smoking cigarettes. She has been smoking about 1.00 pack per day. She has never used smokeless tobacco. She reports that she does not drink alcohol and does not use drugs.  ALLERGIES: Sulfa antibiotics  MEDICATIONS:  Current Outpatient Medications  Medication Sig Dispense Refill  . acetaminophen (TYLENOL) 325 MG tablet Take 650 mg by mouth every 6 (six) hours as needed for moderate pain.    Marland Kitchen ALPRAZolam (XANAX) 0.5 MG tablet Take 1 tablet (0.5 mg total) by mouth at bedtime as needed for anxiety.  30 tablet 0  . lidocaine-prilocaine (EMLA) cream Apply to affected area once 30 g 3  . lisinopril-hydrochlorothiazide (ZESTORETIC) 20-25 MG tablet Take 1 tablet by mouth daily.    . ondansetron (ZOFRAN) 4 MG tablet Take 4 mg by mouth every 8 (eight) hours as needed for nausea or vomiting.    . prochlorperazine (COMPAZINE) 10 MG tablet Take 1 tablet (10 mg total) by mouth every 6 (six) hours  as needed (Nausea or vomiting). 60 tablet 2  . albuterol (PROVENTIL HFA;VENTOLIN HFA) 108 (90 Base) MCG/ACT inhaler Inhale 2 puffs into the lungs every 6 (six) hours as needed for wheezing or shortness of breath. (Patient not taking: Reported on 07/20/2020) 1 Inhaler 2  . butalbital-acetaminophen-caffeine (FIORICET) 50-325-40 MG tablet Take 1-2 tablets by mouth every 6 (six) hours as needed for headache. Do not exceed 6 tablets in 24 hour period. (Patient not taking: Reported on 07/26/2020) 20 tablet 0  . cetirizine (ZYRTEC) 10 MG tablet Take 10 mg by mouth daily. (Patient not taking: Reported on 07/20/2020)    . chlorpheniramine-HYDROcodone (TUSSIONEX) 10-8 MG/5ML SUER Take 5 mLs by mouth every 12 (twelve) hours as needed for cough. (Patient not taking: Reported on 07/20/2020) 140 mL 0  . ciprofloxacin (CILOXAN) 0.3 % ophthalmic solution Place 2 drops into the left eye every 4 (four) hours while awake. Administer 1 drop, every 2 hours, while awake, for 2 days. Then 1 drop, every 4 hours, while awake, for the next 5 days. (Patient not taking: Reported on 07/20/2020) 5 mL 1  . fluticasone (FLOVENT HFA) 110 MCG/ACT inhaler Inhale 2 puffs into the lungs daily.  (Patient not taking: Reported on 07/20/2020)    . guaiFENesin (MUCINEX) 600 MG 12 hr tablet Take 600 mg by mouth 2 (two) times daily. (Patient not taking: Reported on 07/20/2020)    . mupirocin ointment (BACTROBAN) 2 % Place 1 application into the nose 2 (two) times daily. (Patient not taking: Reported on 07/20/2020) 22 g 0   No current facility-administered medications for this encounter.   Facility-Administered Medications Ordered in Other Encounters  Medication Dose Route Frequency Provider Last Rate Last Admin  . sodium chloride flush (NS) 0.9 % injection 10 mL  10 mL Intravenous PRN Lloyd Huger, MD   10 mL at 06/01/20 0905    ECOG PERFORMANCE STATUS: 1  REVIEW OF SYSTEMS: Patient denies any weight loss, fatigue, weakness, fever, chills  or night sweats. Patient denies any loss of vision, blurred vision. Patient denies any ringing  of the ears or hearing loss. No irregular heartbeat. Patient denies heart murmur or history of fainting. Patient denies any chest pain or pain radiating to her upper extremities. Patient denies any shortness of breath, difficulty breathing at night, cough or hemoptysis. Patient denies any swelling in the lower legs. Patient denies any nausea vomiting, vomiting of blood, or coffee ground material in the vomitus. Patient denies any stomach pain. Patient states has had normal bowel movements no significant constipation or diarrhea. Patient denies any dysuria, hematuria or significant nocturia. Patient denies any problems walking, swelling in the joints or loss of balance. Patient denies any skin changes, loss of hair or loss of weight. Patient denies any excessive worrying or anxiety or significant depression. Patient denies any problems with insomnia. Patient denies excessive thirst, polyuria, polydipsia. Patient denies any swollen glands, patient denies easy bruising or easy bleeding. Patient denies any recent infections, allergies or URI. Patient "s visual fields have not changed significantly in recent time.  PHYSICAL EXAM: BP (!) 150/100 (BP Location: Right Arm, Patient Position: Sitting)   Pulse (!) 102   Resp 16   Wt 97 lb 3.2 oz (44.1 kg)   LMP 02/28/2012   BMI 15.93 kg/m  She is status post a wide local excision left breast which is well-healed no dominant mass or nodularity is noted in either breast in 2 positions examined.  No axillary or supraclavicular adenopathy is identified.  She has a Port-A-Cath placed in the right anterior chest wall which is significantly protruding from her chest wall.  Well-developed well-nourished patient in NAD. HEENT reveals PERLA, EOMI, discs not visualized.  Oral cavity is clear. No oral mucosal lesions are identified. Neck is clear without evidence of cervical or  supraclavicular adenopathy. Lungs are clear to A&P. Cardiac examination is essentially unremarkable with regular rate and rhythm without murmur rub or thrill. Abdomen is benign with no organomegaly or masses noted. Motor sensory and DTR levels are equal and symmetric in the upper and lower extremities. Cranial nerves II through XII are grossly intact. Proprioception is intact. No peripheral adenopathy or edema is identified. No motor or sensory levels are noted. Crude visual fields are within normal range.  LABORATORY DATA: Pathology report reviewed    RADIOLOGY RESULTS: Mammogram and ultrasound reviewed compatible with above-stated findings   IMPRESSION: Stage I triple negative invasive mammary carcinoma left breast status post wide local excision followed by adjuvant chemotherapy in 54 year old female  PLAN: At this time I recommended a hypofractionated course of whole breast radiation to her left breast.  I would also boost her scar another 1000 centigrade using electron beam.  Risks and benefits of treatment including skin reaction fatigue alteration of blood counts possible inclusion of superficial lung all were described in detail to the patient.  I have personally set up CT simulation for next week to allow her some recovery from her chemotherapy.  Patient comprehends my recommendations well.  I would like to take this opportunity to thank you for allowing me to participate in the care of your patient.Noreene Filbert, MD

## 2020-08-23 ENCOUNTER — Ambulatory Visit
Admission: RE | Admit: 2020-08-23 | Discharge: 2020-08-23 | Disposition: A | Discharge status: Home / Self Care | Payer: Medicaid Other | Source: Ambulatory Visit | Attending: Surgery | Admitting: Surgery

## 2020-08-23 ENCOUNTER — Other Ambulatory Visit: Payer: Self-pay

## 2020-08-23 ENCOUNTER — Telehealth: Payer: Self-pay

## 2020-08-23 DIAGNOSIS — C50212 Malignant neoplasm of upper-inner quadrant of left female breast: Secondary | ICD-10-CM

## 2020-08-23 NOTE — Telephone Encounter (Signed)
Left detailed message for patient to inform her of recent normal mammogram results and to keep her scheduled appointment with Dr.Pabon on 08/30/20 at 9:15a. Advised patient to give our office a call back if she has any concerns or questions.

## 2020-08-23 NOTE — Telephone Encounter (Signed)
-----   Message from Jules Husbands, MD sent at 08/23/2020  1:26 PM EDT ----- Please let her know her mamo was nml ----- Message ----- From: Interface, Rad Results In Sent: 08/23/2020   1:15 PM EDT To: Jules Husbands, MD

## 2020-08-24 ENCOUNTER — Inpatient Hospital Stay: Payer: Medicaid Other

## 2020-08-24 ENCOUNTER — Inpatient Hospital Stay (HOSPITAL_BASED_OUTPATIENT_CLINIC_OR_DEPARTMENT_OTHER): Payer: Medicaid Other | Admitting: Oncology

## 2020-08-24 ENCOUNTER — Encounter: Payer: Self-pay | Admitting: Oncology

## 2020-08-24 VITALS — BP 125/92 | HR 81 | Temp 97.5°F | Wt 101.7 lb

## 2020-08-24 DIAGNOSIS — Z171 Estrogen receptor negative status [ER-]: Secondary | ICD-10-CM

## 2020-08-24 DIAGNOSIS — C50212 Malignant neoplasm of upper-inner quadrant of left female breast: Secondary | ICD-10-CM

## 2020-08-24 DIAGNOSIS — Z5111 Encounter for antineoplastic chemotherapy: Secondary | ICD-10-CM | POA: Diagnosis not present

## 2020-08-24 LAB — CBC WITH DIFFERENTIAL/PLATELET
Abs Immature Granulocytes: 0.07 10*3/uL (ref 0.00–0.07)
Basophils Absolute: 0.1 10*3/uL (ref 0.0–0.1)
Basophils Relative: 1 %
Eosinophils Absolute: 0.5 10*3/uL (ref 0.0–0.5)
Eosinophils Relative: 7 %
HCT: 38.1 % (ref 36.0–46.0)
Hemoglobin: 12.8 g/dL (ref 12.0–15.0)
Immature Granulocytes: 1 %
Lymphocytes Relative: 16 %
Lymphs Abs: 1.3 10*3/uL (ref 0.7–4.0)
MCH: 32.7 pg (ref 26.0–34.0)
MCHC: 33.6 g/dL (ref 30.0–36.0)
MCV: 97.4 fL (ref 80.0–100.0)
Monocytes Absolute: 0.6 10*3/uL (ref 0.1–1.0)
Monocytes Relative: 8 %
Neutro Abs: 5.2 10*3/uL (ref 1.7–7.7)
Neutrophils Relative %: 67 %
Platelets: 328 10*3/uL (ref 150–400)
RBC: 3.91 MIL/uL (ref 3.87–5.11)
RDW: 13.2 % (ref 11.5–15.5)
WBC: 7.8 10*3/uL (ref 4.0–10.5)
nRBC: 0 % (ref 0.0–0.2)

## 2020-08-24 LAB — COMPREHENSIVE METABOLIC PANEL
ALT: 12 U/L (ref 0–44)
AST: 16 U/L (ref 15–41)
Albumin: 3.4 g/dL — ABNORMAL LOW (ref 3.5–5.0)
Alkaline Phosphatase: 75 U/L (ref 38–126)
Anion gap: 7 (ref 5–15)
BUN: 22 mg/dL — ABNORMAL HIGH (ref 6–20)
CO2: 24 mmol/L (ref 22–32)
Calcium: 8.7 mg/dL — ABNORMAL LOW (ref 8.9–10.3)
Chloride: 105 mmol/L (ref 98–111)
Creatinine, Ser: 0.57 mg/dL (ref 0.44–1.00)
GFR calc Af Amer: >60 mL/min (ref >60)
GFR calc non Af Amer: >60 mL/min (ref >60)
Glucose, Bld: 93 mg/dL (ref 70–99)
Potassium: 4.4 mmol/L (ref 3.5–5.1)
Sodium: 136 mmol/L (ref 135–145)
Total Bilirubin: 0.5 mg/dL (ref 0.3–1.2)
Total Protein: 6.5 g/dL (ref 6.5–8.1)

## 2020-08-24 MED ORDER — SODIUM CHLORIDE 0.9 % IV SOLN
Freq: Once | INTRAVENOUS | Status: AC
  Filled 2020-08-24: qty 250

## 2020-08-24 MED ORDER — HEPARIN SOD (PORK) LOCK FLUSH 100 UNIT/ML IV SOLN
500.0000 [IU] | Freq: Once | INTRAVENOUS | Status: AC | PRN
  Administered 2020-08-24: 500 [IU]
  Filled 2020-08-24: qty 5

## 2020-08-24 MED ORDER — FAMOTIDINE IN NACL 20-0.9 MG/50ML-% IV SOLN
20.0000 mg | Freq: Once | INTRAVENOUS | Status: AC
  Administered 2020-08-24: 20 mg via INTRAVENOUS
  Filled 2020-08-24: qty 50

## 2020-08-24 MED ORDER — SODIUM CHLORIDE 0.9% FLUSH
10.0000 mL | INTRAVENOUS | Status: AC | PRN
  Administered 2020-08-24: 10 mL via INTRAVENOUS
  Filled 2020-08-24: qty 10

## 2020-08-24 MED ORDER — SODIUM CHLORIDE 0.9 % IV SOLN
80.0000 mg/m2 | Freq: Once | INTRAVENOUS | Status: AC
  Administered 2020-08-24: 120 mg via INTRAVENOUS
  Filled 2020-08-24: qty 20

## 2020-08-24 MED ORDER — HEPARIN SOD (PORK) LOCK FLUSH 100 UNIT/ML IV SOLN
INTRAVENOUS | Status: AC
  Filled 2020-08-24: qty 5

## 2020-08-24 MED ORDER — DIPHENHYDRAMINE HCL 50 MG/ML IJ SOLN
25.0000 mg | Freq: Once | INTRAMUSCULAR | Status: AC
  Administered 2020-08-24: 25 mg via INTRAVENOUS
  Filled 2020-08-24: qty 1

## 2020-08-24 MED ORDER — SODIUM CHLORIDE 0.9 % IV SOLN
10.0000 mg | Freq: Once | INTRAVENOUS | Status: AC
  Administered 2020-08-24: 10 mg via INTRAVENOUS
  Filled 2020-08-24: qty 10

## 2020-08-24 NOTE — Progress Notes (Signed)
Patient denies any concerns today.  

## 2020-08-30 ENCOUNTER — Ambulatory Visit (INDEPENDENT_AMBULATORY_CARE_PROVIDER_SITE_OTHER): Payer: Medicaid Other | Admitting: Surgery

## 2020-08-30 ENCOUNTER — Other Ambulatory Visit: Payer: Self-pay

## 2020-08-30 ENCOUNTER — Encounter: Payer: Self-pay | Admitting: Surgery

## 2020-08-30 VITALS — BP 89/66 | HR 89 | Temp 98.7°F | Resp 14 | Wt 98.2 lb

## 2020-08-30 DIAGNOSIS — C50212 Malignant neoplasm of upper-inner quadrant of left female breast: Secondary | ICD-10-CM

## 2020-08-30 NOTE — Patient Instructions (Signed)
Our office will contact you around February 2022 to schedule your mammogram and office visit for March 2022.  Call the office if you have any questions or concerns.  Breast Self-Awareness Breast self-awareness means being familiar with how your breasts look and feel. It involves checking your breasts regularly and reporting any changes to your health care provider. Practicing breast self-awareness is important. Sometimes changes may not be harmful (are benign), but sometimes a change in your breasts can be a sign of a serious medical problem. It is important to learn how to do this procedure correctly so that you can catch problems early, when treatment is more likely to be successful. All women should practice breast self-awareness, including women who have had breast implants. What you need:  A mirror.  A well-lit room. How to do a breast self-exam A breast self-exam is one way to learn what is normal for your breasts and whether your breasts are changing. To do a breast self-exam: Look for changes  1. Remove all the clothing above your waist. 2. Stand in front of a mirror in a room with good lighting. 3. Put your hands on your hips. 4. Push your hands firmly downward. 5. Compare your breasts in the mirror. Look for differences between them (asymmetry), such as: ? Differences in shape. ? Differences in size. ? Puckers, dips, and bumps in one breast and not the other. 6. Look at each breast for changes in the skin, such as: ? Redness. ? Scaly areas. 7. Look for changes in your nipples, such as: ? Discharge. ? Bleeding. ? Dimpling. ? Redness. ? A change in position. Feel for changes Carefully feel your breasts for lumps and changes. It is best to do this while lying on your back on the floor, and again while sitting or standing in the tub or shower with soapy water on your skin. Feel each breast in the following way: 1. Place the arm on the side of the breast you are examining  above your head. 2. Feel your breast with the other hand. 3. Start in the nipple area and make -inch (2 cm) overlapping circles to feel your breast. Use the pads of your three middle fingers to do this. Apply light pressure, then medium pressure, then firm pressure. The light pressure will allow you to feel the tissue closest to the skin. The medium pressure will allow you to feel the tissue that is a little deeper. The firm pressure will allow you to feel the tissue close to the ribs. 4. Continue the overlapping circles, moving downward over the breast until you feel your ribs below your breast. 5. Move one finger-width toward the center of the body. Continue to use the -inch (2 cm) overlapping circles to feel your breast as you move slowly up toward your collarbone. 6. Continue the up-and-down exam using all three pressures until you reach your armpit.  Write down what you find Writing down what you find can help you remember what to discuss with your health care provider. Write down:  What is normal for each breast.  Any changes that you find in each breast, including: ? The kind of changes you find. ? Any pain or tenderness. ? Size and location of any lumps.  Where you are in your menstrual cycle, if you are still menstruating. General tips and recommendations  Examine your breasts every month.  If you are breastfeeding, the best time to examine your breasts is after a feeding or after using  a breast pump.  If you menstruate, the best time to examine your breasts is 5-7 days after your period. Breasts are generally lumpier during menstrual periods, and it may be more difficult to notice changes.  With time and practice, you will become more familiar with the variations in your breasts and more comfortable with the exam. Contact a health care provider if you:  See a change in the shape or size of your breasts or nipples.  See a change in the skin of your breast or nipples, such as  a reddened or scaly area.  Have unusual discharge from your nipples.  Find a lump or thick area that was not there before.  Have pain in your breasts.  Have any concerns related to your breast health. Summary  Breast self-awareness includes looking for physical changes in your breasts, as well as feeling for any changes within your breasts.  Breast self-awareness should be performed in front of a mirror in a well-lit room.  You should examine your breasts every month. If you menstruate, the best time to examine your breasts is 5-7 days after your menstrual period.  Let your health care provider know of any changes you notice in your breasts, including changes in size, changes on the skin, pain or tenderness, or unusual fluid from your nipples. This information is not intended to replace advice given to you by your health care provider. Make sure you discuss any questions you have with your health care provider. Document Revised: 07/14/2018 Document Reviewed: 07/14/2018 Elsevier Patient Education  [place].

## 2020-08-31 ENCOUNTER — Ambulatory Visit
Admission: RE | Admit: 2020-08-31 | Discharge: 2020-08-31 | Disposition: A | Discharge status: Home / Self Care | Payer: Medicaid Other | Source: Ambulatory Visit | Attending: Radiation Oncology | Admitting: Radiation Oncology

## 2020-08-31 DIAGNOSIS — Z171 Estrogen receptor negative status [ER-]: Secondary | ICD-10-CM | POA: Insufficient documentation

## 2020-08-31 DIAGNOSIS — C50212 Malignant neoplasm of upper-inner quadrant of left female breast: Secondary | ICD-10-CM | POA: Insufficient documentation

## 2020-08-31 NOTE — Progress Notes (Signed)
Outpatient Surgical Follow Up  08/31/2020  Deanna Ramsey is an 54 y.o. female.   Chief Complaint  Patient presents with  . Follow-up    [facility] 08/23/20    HPI: Deanna Ramsey is a 54 year old female status post lumpectomy for triple negative breast cancer, status post adjuvant chemotherapy and now getting ready to start radiation.  Did require reexcision of lumpectomy cavity due to questionable margins.  There is no evidence of any remaining malignancy within the excised specimen. She reports feeling well but tired due to the chemotherapy.  No fevers no chills.  She already completed her simulation for the radiation.  She did have a mammogram that I personally reviewed showing no evidence of any recurrent lesions.  She denies any fevers and chills and no evidence of breast masses or breast issues at this time.  Past Medical History:  Diagnosis Date  . Arthritis   . Breast cancer ([place]) 01/2020   left breast triple neagative IMC, HG DCIS  . Cellulitis   . COPD (chronic obstructive pulmonary disease) ([place])   . Depression   . History of kidney stones   . History of tracheostomy 2000   stated that epiglottis swelled up for an unknown reason; had trach for about a week.   . Hypertension   . Personal history of chemotherapy 2021   current  . Pneumonia     Past Surgical History:  Procedure Laterality Date  . arm surgery  1984   fractured ulna  . BREAST BIOPSY Left 01/2020   [place] 3:00 10cmfn [place] bx  . BREAST LUMPECTOMY Left 02/04/2020   triple negative, IMC and HG DCIS with positive margins,  6 LN's negative   . BREAST LUMPECTOMY Left 02/24/2020   re excision for margins  . BREAST LUMPECTOMY WITH SENTINEL LYMPH NODE BIOPSY Left 02/04/2020   Procedure: BREAST LUMPECTOMY WITH SENTINEL LYMPH NODE BX;  Surgeon: Jules Husbands, MD;  Location: [facility] ORS;  Service: General;  Laterality: Left;  . CLEFT LIP REPAIR    . FRACTURE SURGERY    . kidney stones    . lymphnode neck    . PORTACATH  PLACEMENT N/A 02/04/2020   Procedure: INSERTION PORT-A-CATH;  Surgeon: Jules Husbands, MD;  Location: [facility] ORS;  Service: General;  Laterality: N/A;  . RE-EXCISION OF BREAST LUMPECTOMY Left 02/24/2020   Procedure: RE-EXCISION OF Left BREAST LUMPECTOMY;  Surgeon: Jules Husbands, MD;  Location: [facility] ORS;  Service: General;  Laterality: Left;  . TYMPANOSTOMY TUBE PLACEMENT      Family History  Problem Relation Age of Onset  . COPD Other   . COPD Mother   . Hypertension Mother   . COPD Father   . Breast cancer Neg Hx     Social History:  reports that she has been smoking cigarettes. She has been smoking about 1.00 pack per day. She has never used smokeless tobacco. She reports that she does not drink alcohol and does not use drugs.  Allergies:  Allergies  Allergen Reactions  . Sulfa Antibiotics Nausea And Vomiting    Medications reviewed.    ROS Full ROS performed and is otherwise negative other than what is stated in HPI   BP (!) 89/66   Pulse 89   Temp 98.7 F (37.1 C)   Resp 14   Wt 98 lb 3.2 oz (44.5 kg)   LMP 02/28/2012   SpO2 97%   BMI 16.09 kg/m   Physical Exam Vitals and nursing  note reviewed. Exam conducted with a chaperone present.  Constitutional:      Appearance: Normal appearance. She is normal weight.  Eyes:     General: No scleral icterus.       Right eye: No discharge.        Left eye: No discharge.  Cardiovascular:     Rate and Rhythm: Normal rate and regular rhythm.  Pulmonary:     Effort: Pulmonary effort is normal.     Breath sounds: Normal breath sounds.     Comments: lumpEctomy scar with sentinel lymph node biopsy scar.  There is good symmetry.  There is actually excellent cosmetic result without any deformity of the breast. No evidence of any suspicious masses on either breast.  No evidence of lymphadenopathy. Abdominal:     General: Abdomen is flat. There is no distension.     Palpations: Abdomen is soft. There is no mass.   Musculoskeletal:     Cervical back: Normal range of motion and neck supple. No rigidity or tenderness.  Lymphadenopathy:     Cervical: No cervical adenopathy.  Skin:    General: Skin is warm and dry.     Capillary Refill: Capillary refill takes less than 2 seconds.  Neurological:     General: No focal deficit present.     Mental Status: She is alert and oriented to person, place, and time.  Psychiatric:        Mood and Affect: Mood normal.        Behavior: Behavior normal.        Thought Content: Thought content normal.        Judgment: Judgment normal.       Assessment/Plan: Triple negative breast cancer on the left side status post lumpectomy and adjuvant chemo therapy and about to start radiation.  No evidence of early recurrence.  We will see her back in 6 months with a physical exam and mammogram. Greater than 50% of the 25 minutes  visit was spent in counseling/coordination of care   Caroleen Hamman, MD [facility]

## 2020-09-01 ENCOUNTER — Other Ambulatory Visit: Payer: Self-pay | Admitting: *Deleted

## 2020-09-01 ENCOUNTER — Other Ambulatory Visit: Payer: Medicaid Other

## 2020-09-01 DIAGNOSIS — C50212 Malignant neoplasm of upper-inner quadrant of left female breast: Secondary | ICD-10-CM | POA: Diagnosis not present

## 2020-09-01 DIAGNOSIS — Z171 Estrogen receptor negative status [ER-]: Secondary | ICD-10-CM

## 2020-09-05 ENCOUNTER — Other Ambulatory Visit: Payer: Self-pay | Admitting: *Deleted

## 2020-09-05 MED ORDER — ALPRAZOLAM 0.5 MG PO TABS: 0.5000 mg | ORAL_TABLET | Freq: Every evening | ORAL | 0 refills | Status: DC | PRN

## 2020-09-07 ENCOUNTER — Inpatient Hospital Stay: Payer: Medicaid Other | Attending: Family Medicine

## 2020-09-07 ENCOUNTER — Telehealth: Payer: Self-pay

## 2020-09-07 ENCOUNTER — Ambulatory Visit: Admission: RE | Admit: 2020-09-07 | Payer: Medicaid Other | Source: Ambulatory Visit

## 2020-09-07 DIAGNOSIS — C50212 Malignant neoplasm of upper-inner quadrant of left female breast: Secondary | ICD-10-CM | POA: Diagnosis not present

## 2020-09-07 NOTE — Telephone Encounter (Signed)
Nutrition  Called patient for nutrition follow-up no answer.  Left message with call back number.   Giles Currie B. Zenia Resides, [place], [place] Registered Dietitian [phone] (mobile)

## 2020-09-11 ENCOUNTER — Inpatient Hospital Stay: Payer: Medicaid Other | Attending: Radiation Oncology

## 2020-09-11 ENCOUNTER — Ambulatory Visit
Admission: RE | Admit: 2020-09-11 | Discharge: 2020-09-11 | Disposition: A | Discharge status: Home / Self Care | Payer: Medicaid Other | Source: Ambulatory Visit | Attending: Radiation Oncology | Admitting: Radiation Oncology

## 2020-09-11 DIAGNOSIS — C50212 Malignant neoplasm of upper-inner quadrant of left female breast: Secondary | ICD-10-CM | POA: Diagnosis present

## 2020-09-11 DIAGNOSIS — Z171 Estrogen receptor negative status [ER-]: Secondary | ICD-10-CM | POA: Diagnosis not present

## 2020-09-11 DIAGNOSIS — Z510 Encounter for antineoplastic radiation therapy: Secondary | ICD-10-CM | POA: Diagnosis not present

## 2020-09-12 ENCOUNTER — Ambulatory Visit
Admission: RE | Admit: 2020-09-12 | Discharge: 2020-09-12 | Disposition: A | Discharge status: Home / Self Care | Payer: Medicaid Other | Source: Ambulatory Visit | Attending: Radiation Oncology | Admitting: Radiation Oncology

## 2020-09-12 ENCOUNTER — Inpatient Hospital Stay: Payer: Medicaid Other

## 2020-09-12 DIAGNOSIS — Z510 Encounter for antineoplastic radiation therapy: Secondary | ICD-10-CM | POA: Diagnosis not present

## 2020-09-13 ENCOUNTER — Inpatient Hospital Stay: Payer: Medicaid Other

## 2020-09-13 ENCOUNTER — Ambulatory Visit
Admission: RE | Admit: 2020-09-13 | Discharge: 2020-09-13 | Disposition: A | Discharge status: Home / Self Care | Payer: Medicaid Other | Source: Ambulatory Visit | Attending: Radiation Oncology | Admitting: Radiation Oncology

## 2020-09-13 DIAGNOSIS — Z510 Encounter for antineoplastic radiation therapy: Secondary | ICD-10-CM | POA: Diagnosis not present

## 2020-09-14 ENCOUNTER — Ambulatory Visit
Admission: RE | Admit: 2020-09-14 | Discharge: 2020-09-14 | Disposition: A | Discharge status: Home / Self Care | Payer: Medicaid Other | Source: Ambulatory Visit | Attending: Radiation Oncology | Admitting: Radiation Oncology

## 2020-09-14 ENCOUNTER — Inpatient Hospital Stay: Payer: Medicaid Other

## 2020-09-14 DIAGNOSIS — Z510 Encounter for antineoplastic radiation therapy: Secondary | ICD-10-CM | POA: Diagnosis not present

## 2020-09-15 ENCOUNTER — Ambulatory Visit
Admission: RE | Admit: 2020-09-15 | Discharge: 2020-09-15 | Disposition: A | Discharge status: Home / Self Care | Payer: Medicaid Other | Source: Ambulatory Visit | Attending: Radiation Oncology | Admitting: Radiation Oncology

## 2020-09-15 ENCOUNTER — Inpatient Hospital Stay: Payer: Medicaid Other

## 2020-09-15 DIAGNOSIS — Z510 Encounter for antineoplastic radiation therapy: Secondary | ICD-10-CM | POA: Diagnosis not present

## 2020-09-18 ENCOUNTER — Ambulatory Visit
Admission: RE | Admit: 2020-09-18 | Discharge: 2020-09-18 | Disposition: A | Discharge status: Home / Self Care | Payer: Medicaid Other | Source: Ambulatory Visit | Attending: Radiation Oncology | Admitting: Radiation Oncology

## 2020-09-18 ENCOUNTER — Inpatient Hospital Stay: Payer: Medicaid Other

## 2020-09-18 DIAGNOSIS — Z510 Encounter for antineoplastic radiation therapy: Secondary | ICD-10-CM | POA: Diagnosis not present

## 2020-09-19 ENCOUNTER — Inpatient Hospital Stay: Payer: Medicaid Other

## 2020-09-19 ENCOUNTER — Ambulatory Visit
Admission: RE | Admit: 2020-09-19 | Discharge: 2020-09-19 | Disposition: A | Discharge status: Home / Self Care | Payer: Medicaid Other | Source: Ambulatory Visit | Attending: Radiation Oncology | Admitting: Radiation Oncology

## 2020-09-19 DIAGNOSIS — Z510 Encounter for antineoplastic radiation therapy: Secondary | ICD-10-CM | POA: Diagnosis not present

## 2020-09-20 ENCOUNTER — Inpatient Hospital Stay: Payer: Medicaid Other

## 2020-09-20 ENCOUNTER — Ambulatory Visit: Payer: Medicaid Other

## 2020-09-21 ENCOUNTER — Ambulatory Visit
Admission: RE | Admit: 2020-09-21 | Discharge: 2020-09-21 | Disposition: A | Discharge status: Home / Self Care | Payer: Medicaid Other | Source: Ambulatory Visit | Attending: Radiation Oncology | Admitting: Radiation Oncology

## 2020-09-21 ENCOUNTER — Inpatient Hospital Stay: Payer: Medicaid Other

## 2020-09-21 DIAGNOSIS — Z510 Encounter for antineoplastic radiation therapy: Secondary | ICD-10-CM | POA: Diagnosis not present

## 2020-09-22 ENCOUNTER — Other Ambulatory Visit: Payer: Self-pay

## 2020-09-22 ENCOUNTER — Inpatient Hospital Stay: Payer: Medicaid Other

## 2020-09-22 ENCOUNTER — Ambulatory Visit
Admission: RE | Admit: 2020-09-22 | Discharge: 2020-09-22 | Disposition: A | Discharge status: Home / Self Care | Payer: Medicaid Other | Source: Ambulatory Visit | Attending: Radiation Oncology | Admitting: Radiation Oncology

## 2020-09-22 DIAGNOSIS — Z510 Encounter for antineoplastic radiation therapy: Secondary | ICD-10-CM | POA: Diagnosis not present

## 2020-09-25 ENCOUNTER — Inpatient Hospital Stay: Payer: Medicaid Other

## 2020-09-25 ENCOUNTER — Ambulatory Visit
Admission: RE | Admit: 2020-09-25 | Discharge: 2020-09-25 | Disposition: A | Discharge status: Home / Self Care | Payer: Medicaid Other | Source: Ambulatory Visit | Attending: Radiation Oncology | Admitting: Radiation Oncology

## 2020-09-25 DIAGNOSIS — Z510 Encounter for antineoplastic radiation therapy: Secondary | ICD-10-CM | POA: Diagnosis not present

## 2020-09-25 DIAGNOSIS — Z171 Estrogen receptor negative status [ER-]: Secondary | ICD-10-CM | POA: Diagnosis not present

## 2020-09-25 DIAGNOSIS — C50212 Malignant neoplasm of upper-inner quadrant of left female breast: Secondary | ICD-10-CM | POA: Diagnosis not present

## 2020-09-25 LAB — CBC
HCT: 43.9 % (ref 36.0–46.0)
Hemoglobin: 15 g/dL (ref 12.0–15.0)
MCH: 32.8 pg (ref 26.0–34.0)
MCHC: 34.2 g/dL (ref 30.0–36.0)
MCV: 95.9 fL (ref 80.0–100.0)
Platelets: 307 10*3/uL (ref 150–400)
RBC: 4.58 MIL/uL (ref 3.87–5.11)
RDW: 13.7 % (ref 11.5–15.5)
WBC: 7.7 10*3/uL (ref 4.0–10.5)
nRBC: 0 % (ref 0.0–0.2)

## 2020-09-26 ENCOUNTER — Ambulatory Visit
Admission: RE | Admit: 2020-09-26 | Discharge: 2020-09-26 | Disposition: A | Discharge status: Home / Self Care | Payer: Medicaid Other | Source: Ambulatory Visit | Attending: Radiation Oncology | Admitting: Radiation Oncology

## 2020-09-26 ENCOUNTER — Inpatient Hospital Stay: Payer: Medicaid Other

## 2020-09-26 DIAGNOSIS — Z510 Encounter for antineoplastic radiation therapy: Secondary | ICD-10-CM | POA: Diagnosis not present

## 2020-09-27 ENCOUNTER — Ambulatory Visit
Admission: RE | Admit: 2020-09-27 | Discharge: 2020-09-27 | Disposition: A | Discharge status: Home / Self Care | Payer: Medicaid Other | Source: Ambulatory Visit | Attending: Radiation Oncology | Admitting: Radiation Oncology

## 2020-09-27 ENCOUNTER — Inpatient Hospital Stay: Payer: Medicaid Other

## 2020-09-27 DIAGNOSIS — Z510 Encounter for antineoplastic radiation therapy: Secondary | ICD-10-CM | POA: Diagnosis not present

## 2020-09-28 ENCOUNTER — Ambulatory Visit
Admission: RE | Admit: 2020-09-28 | Discharge: 2020-09-28 | Disposition: A | Discharge status: Home / Self Care | Payer: Medicaid Other | Source: Ambulatory Visit | Attending: Radiation Oncology | Admitting: Radiation Oncology

## 2020-09-28 ENCOUNTER — Inpatient Hospital Stay: Payer: Medicaid Other

## 2020-09-28 DIAGNOSIS — Z510 Encounter for antineoplastic radiation therapy: Secondary | ICD-10-CM | POA: Diagnosis not present

## 2020-09-29 ENCOUNTER — Ambulatory Visit
Admission: RE | Admit: 2020-09-29 | Discharge: 2020-09-29 | Disposition: A | Discharge status: Home / Self Care | Payer: Medicaid Other | Source: Ambulatory Visit | Attending: Radiation Oncology | Admitting: Radiation Oncology

## 2020-09-29 ENCOUNTER — Inpatient Hospital Stay: Payer: Medicaid Other

## 2020-09-29 DIAGNOSIS — Z510 Encounter for antineoplastic radiation therapy: Secondary | ICD-10-CM | POA: Diagnosis not present

## 2020-10-02 ENCOUNTER — Ambulatory Visit
Admission: RE | Admit: 2020-10-02 | Discharge: 2020-10-02 | Disposition: A | Discharge status: Home / Self Care | Payer: Medicaid Other | Source: Ambulatory Visit | Attending: Radiation Oncology | Admitting: Radiation Oncology

## 2020-10-02 ENCOUNTER — Inpatient Hospital Stay: Payer: Medicaid Other

## 2020-10-02 DIAGNOSIS — Z510 Encounter for antineoplastic radiation therapy: Secondary | ICD-10-CM | POA: Diagnosis not present

## 2020-10-03 ENCOUNTER — Ambulatory Visit
Admission: RE | Admit: 2020-10-03 | Discharge: 2020-10-03 | Disposition: A | Discharge status: Home / Self Care | Payer: Medicaid Other | Source: Ambulatory Visit | Attending: Radiation Oncology | Admitting: Radiation Oncology

## 2020-10-03 ENCOUNTER — Inpatient Hospital Stay: Payer: Medicaid Other

## 2020-10-03 ENCOUNTER — Ambulatory Visit: Payer: Medicaid Other

## 2020-10-03 DIAGNOSIS — Z510 Encounter for antineoplastic radiation therapy: Secondary | ICD-10-CM | POA: Diagnosis not present

## 2020-10-04 ENCOUNTER — Inpatient Hospital Stay: Payer: Medicaid Other

## 2020-10-04 ENCOUNTER — Ambulatory Visit
Admission: RE | Admit: 2020-10-04 | Discharge: 2020-10-04 | Disposition: A | Discharge status: Home / Self Care | Payer: Medicaid Other | Source: Ambulatory Visit | Attending: Radiation Oncology | Admitting: Radiation Oncology

## 2020-10-04 DIAGNOSIS — Z510 Encounter for antineoplastic radiation therapy: Secondary | ICD-10-CM | POA: Diagnosis not present

## 2020-10-05 ENCOUNTER — Ambulatory Visit
Admission: RE | Admit: 2020-10-05 | Discharge: 2020-10-05 | Disposition: A | Discharge status: Home / Self Care | Payer: Medicaid Other | Source: Ambulatory Visit | Attending: Radiation Oncology | Admitting: Radiation Oncology

## 2020-10-05 ENCOUNTER — Inpatient Hospital Stay: Payer: Medicaid Other

## 2020-10-05 DIAGNOSIS — Z510 Encounter for antineoplastic radiation therapy: Secondary | ICD-10-CM | POA: Diagnosis not present

## 2020-10-06 ENCOUNTER — Inpatient Hospital Stay: Payer: Medicaid Other

## 2020-10-06 ENCOUNTER — Ambulatory Visit
Admission: RE | Admit: 2020-10-06 | Discharge: 2020-10-06 | Disposition: A | Discharge status: Home / Self Care | Payer: Medicaid Other | Source: Ambulatory Visit | Attending: Radiation Oncology | Admitting: Radiation Oncology

## 2020-10-06 DIAGNOSIS — Z510 Encounter for antineoplastic radiation therapy: Secondary | ICD-10-CM | POA: Diagnosis not present

## 2020-10-09 ENCOUNTER — Inpatient Hospital Stay: Payer: Medicaid Other | Attending: Oncology

## 2020-10-09 ENCOUNTER — Ambulatory Visit: Payer: Medicaid Other

## 2020-10-09 ENCOUNTER — Other Ambulatory Visit: Payer: Self-pay

## 2020-10-09 ENCOUNTER — Ambulatory Visit
Admission: RE | Admit: 2020-10-09 | Discharge: 2020-10-09 | Disposition: A | Discharge status: Home / Self Care | Payer: Medicaid Other | Source: Ambulatory Visit | Attending: Radiation Oncology | Admitting: Radiation Oncology

## 2020-10-09 DIAGNOSIS — Z171 Estrogen receptor negative status [ER-]: Secondary | ICD-10-CM | POA: Diagnosis not present

## 2020-10-09 DIAGNOSIS — Z510 Encounter for antineoplastic radiation therapy: Secondary | ICD-10-CM | POA: Insufficient documentation

## 2020-10-09 DIAGNOSIS — C50212 Malignant neoplasm of upper-inner quadrant of left female breast: Secondary | ICD-10-CM | POA: Insufficient documentation

## 2020-10-09 MED ORDER — ALPRAZOLAM 0.5 MG PO TABS: 0.5000 mg | ORAL_TABLET | Freq: Every evening | ORAL | 0 refills | Status: DC | PRN

## 2020-10-09 NOTE — Telephone Encounter (Signed)
Patient called and left message on voice mail that she was needing a refill on xanax and would like to have sent to [facility]. She states she had spoke with someone last week but not heard anything. Patient's last refill was on 09/05/2020.  Pending refill for provider's approval.

## 2020-10-10 ENCOUNTER — Inpatient Hospital Stay: Payer: Medicaid Other

## 2020-10-10 ENCOUNTER — Ambulatory Visit
Admission: RE | Admit: 2020-10-10 | Discharge: 2020-10-10 | Disposition: A | Discharge status: Home / Self Care | Payer: Medicaid Other | Source: Ambulatory Visit | Attending: Radiation Oncology | Admitting: Radiation Oncology

## 2020-10-10 ENCOUNTER — Other Ambulatory Visit: Payer: Self-pay | Admitting: *Deleted

## 2020-10-10 DIAGNOSIS — Z510 Encounter for antineoplastic radiation therapy: Secondary | ICD-10-CM | POA: Diagnosis not present

## 2020-10-10 DIAGNOSIS — Z171 Estrogen receptor negative status [ER-]: Secondary | ICD-10-CM

## 2020-10-16 ENCOUNTER — Other Ambulatory Visit: Payer: Self-pay | Admitting: *Deleted

## 2020-10-16 MED ORDER — SILVER SULFADIAZINE 1 % EX CREA: 1.0000 "application " | TOPICAL_CREAM | Freq: Two times a day (BID) | CUTANEOUS | 2 refills | Status: DC

## 2020-10-20 ENCOUNTER — Telehealth: Payer: Self-pay | Admitting: *Deleted

## 2020-10-20 ENCOUNTER — Encounter: Payer: Self-pay | Admitting: Oncology

## 2020-10-20 NOTE — Telephone Encounter (Signed)
If we can yes.  Thank you.

## 2020-10-20 NOTE — Telephone Encounter (Signed)
Patient called in to request appt for IVF be added to her scheduled appts on Monday 11/15. She states she feels weak and dehydrated, completed XRT on 11/2, chemo on 9/16. Would you like to add labs/IVF to her appts?

## 2020-10-20 NOTE — Progress Notes (Signed)
Patient called for pre-assessment, patient reports weakness and dehydration. She completed radiation last week. Patient has been added on to get IVF when she comes in for follow up.

## 2020-10-20 NOTE — Telephone Encounter (Signed)
Fluids have been added on.  Deanna Ramsey

## 2020-10-21 NOTE — Progress Notes (Signed)
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  INTERVAL HISTORY: Patient returns to clinic today for further evaluation after completing XRT approximately 1 week ago.  She initially complained of increased weakness and fatigue and dehydration, but feels this is improving.  She has pain on her breast at the site of radiation secondary to skin breakdown.  She has no neurologic complaints.  She has a fair appetite.  She has no chest pain, shortness of breath, or hemoptysis.  She denies any nausea, vomiting, constipation, or diarrhea.  She has no urinary complaints.  Patient offers no further specific complaints today.  REVIEW OF SYSTEMS:   Review of Systems  Constitutional: Positive for malaise/fatigue. Negative for fever and weight loss.  HENT: Negative for congestion.   Respiratory: Negative.  Negative for cough, hemoptysis and shortness of breath.   Cardiovascular: Negative.  Negative for chest pain and leg swelling.  Gastrointestinal: Negative.  Negative for abdominal pain, diarrhea and nausea.  Genitourinary: Negative.  Negative for dysuria.  Musculoskeletal: Negative.  Negative for back pain.  Skin: Negative.  Negative for rash.  Neurological: Positive for weakness. Negative for dizziness, focal weakness and headaches.  Psychiatric/Behavioral: Negative.  The patient is not nervous/anxious and does not have insomnia.     As per  HPI. Otherwise, a complete review of systems is negative.  PAST MEDICAL HISTORY: Past Medical History:  Diagnosis Date   Arthritis    Breast cancer ([place]) 01/2020   left breast triple neagative IMC, HG DCIS   Cellulitis    COPD (chronic obstructive pulmonary disease) (HCC)    Depression    History of kidney stones    History of tracheostomy 2000   stated that epiglottis swelled up for an unknown reason; had trach for about a week.    Hypertension    Personal history of chemotherapy 2021   current   Pneumonia     PAST SURGICAL HISTORY: Past Surgical History:  Procedure Laterality Date   arm surgery  1984   fractured ulna   BREAST BIOPSY Left 01/2020   [place] 3:00 10cmfn [place] bx   BREAST LUMPECTOMY Left 02/04/2020   triple negative, IMC and HG DCIS with positive margins,  6 LN's negative    BREAST LUMPECTOMY Left 02/24/2020   re excision for margins   BREAST LUMPECTOMY WITH SENTINEL LYMPH NODE BIOPSY Left 02/04/2020   Procedure: BREAST LUMPECTOMY WITH SENTINEL LYMPH NODE BX;  Surgeon: Jules Husbands, MD;  Location: [facility] ORS;  Service: General;  Laterality: Left;   CLEFT LIP REPAIR     FRACTURE SURGERY     kidney stones     lymphnode neck     PORTACATH PLACEMENT N/A 02/04/2020   Procedure: INSERTION PORT-A-CATH;  Surgeon: Jules Husbands, MD;  Location: [facility] ORS;  Service: General;  Laterality: N/A;   RE-EXCISION OF BREAST LUMPECTOMY Left 02/24/2020   Procedure: RE-EXCISION OF Left BREAST LUMPECTOMY;  Surgeon: Jules Husbands, MD;  Location: [facility] ORS;  Service: General;  Laterality: Left;  TYMPANOSTOMY TUBE PLACEMENT      FAMILY HISTORY: Family History  Problem Relation Age of Onset   COPD Other    COPD Mother    Hypertension Mother    COPD Father    Breast cancer Neg Hx     ADVANCED DIRECTIVES (Y/N):  N  HEALTH MAINTENANCE: Social History   Tobacco Use   Smoking status: Current Every Day Smoker    Packs/day: 1.00    Types: Cigarettes    Smokeless tobacco: Never Used  Vaping Use   Vaping Use: Never used  Substance Use Topics   Alcohol use: No   Drug use: No     Colonoscopy:  PAP:  Bone density:  Lipid panel:  Allergies  Allergen Reactions   Sulfa Antibiotics Nausea And Vomiting    Current Outpatient Medications  Medication Sig Dispense Refill   acetaminophen (TYLENOL) 325 MG tablet Take 650 mg by mouth every 6 (six) hours as needed for moderate pain.     albuterol (PROVENTIL HFA;VENTOLIN HFA) 108 (90 Base) MCG/ACT inhaler Inhale 2 puffs into the lungs every 6 (six) hours as needed for wheezing or shortness of breath. 1 Inhaler 2   ALPRAZolam (XANAX) 0.5 MG tablet Take 1 tablet (0.5 mg total) by mouth at bedtime as needed for anxiety. 30 tablet 0   cetirizine (ZYRTEC) 10 MG tablet Take 10 mg by mouth daily.      guaiFENesin (MUCINEX) 600 MG 12 hr tablet Take 600 mg by mouth 2 (two) times daily.      lidocaine-prilocaine (EMLA) cream Apply to affected area once 30 g 3   lisinopril-hydrochlorothiazide (ZESTORETIC) 20-25 MG tablet Take 1 tablet by mouth daily.     mupirocin ointment (BACTROBAN) 2 % Place 1 application into the nose 2 (two) times daily. 22 g 0   ondansetron (ZOFRAN) 4 MG tablet Take 4 mg by mouth every 8 (eight) hours as needed for nausea or vomiting.     prochlorperazine (COMPAZINE) 10 MG tablet Take 1 tablet (10 mg total) by mouth every 6 (six) hours as needed (Nausea or vomiting). 60 tablet 2   silver sulfADIAZINE (SILVADENE) 1 % cream Apply 1 application topically 2 (two) times daily. 85 g 2   No current facility-administered medications for this visit.   Facility-Administered Medications Ordered in Other Visits  Medication Dose Route Frequency Provider Last Rate Last Admin   sodium chloride flush (NS) 0.9 % injection 10 mL  10 mL Intravenous PRN Lloyd Huger, MD   10 mL at 06/01/20 0905   sodium chloride flush (NS) 0.9 % injection 10 mL  10 mL Intravenous PRN Lloyd Huger, MD   10 mL at 08/24/20 0900    OBJECTIVE: Vitals:   10/23/20 1143  BP: (!) 136/93  Pulse: 94  Resp: 20  Temp: 97.8 F (36.6 C)  SpO2: 94%     Body mass index is 16.49 kg/m.    ECOG FS:0 - Asymptomatic  General: Well-developed, well-nourished, no acute distress. Eyes: Pink conjunctiva, anicteric sclera. HEENT: Normocephalic, moist mucous membranes. Breast: Significant erythema of right breast. Lungs: No audible wheezing or coughing. Heart: Regular rate and rhythm. Abdomen: Soft, nontender, no obvious distention. Musculoskeletal: No edema, cyanosis, or clubbing. Neuro: Alert, answering all questions appropriately. Cranial nerves grossly intact. Skin: No rashes or petechiae noted. Psych: Normal affect.  LAB RESULTS:  Lab Results  Component Value Date   NA 136 08/24/2020   K 4.4 08/24/2020   CL 105 08/24/2020   CO2  24 08/24/2020   GLUCOSE 93 08/24/2020   BUN 22 (H) 08/24/2020   CREATININE 0.57 08/24/2020   CALCIUM 8.7 (L) 08/24/2020   PROT 6.5 08/24/2020   ALBUMIN 3.4 (L) 08/24/2020   AST 16 08/24/2020   ALT 12 08/24/2020   ALKPHOS 75 08/24/2020   BILITOT 0.5 08/24/2020   GFRNONAA >60 08/24/2020   GFRAA >60 08/24/2020    Lab Results  Component Value Date   WBC 7.7 09/25/2020   NEUTROABS 5.2 08/24/2020   HGB 15.0 09/25/2020   HCT 43.9 09/25/2020   MCV 95.9 09/25/2020   PLT 307 09/25/2020     STUDIES: No results found.  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  PLAN:    1.  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  Because of positive margins she underwent reexcision on February 24, 2020 with clear margins.  She has now had a port placed as well.  MUGA scan from March 22, 2020 reported an EF of 72%. Given the triple negative status of her disease, she will receive  adjuvant chemotherapy using Adriamycin and Cytoxan with Udenyca support followed by weekly Taxol x12.  She completed her chemotherapy on August 24, 2020.  Patient has now completed adjuvant XRT.  An aromatase inhibitor would not offer any benefit given the triple negative status of her disease.  Genetics referral has been placed.  Return to clinic in 3 months for routine evaluation.   2.  Anxiety/insomnia: Continue Xanax 0.5 mg at night as needed.   Patient expressed understanding given the conclusion of her treatments further management should be transitioned to her primary care physician. 3.  Migraines: Patient does not complain of this today.  Continue Fioricet as needed.  Patient was previously given a referral to neurology for further evaluation. 4.  Depression: Improved.  Patient declined initiation of antidepressant medication or referral for counseling. 5.  Cough: Patient does not complain of this today.  Previously, chest x-ray reviewed independently with no obvious pathology.  Patient has been instructed to continue her her inhalers as prescribed. 6.  Breast erythema: Continue topical treatments as directed. 7.  Dehydration: Patient states this is improving and has declined IV fluids today.   Patient expressed understanding and was in agreement with this plan. She also understands that She can call clinic at any time with any questions, concerns, or complaints.   Cancer Staging Carcinoma of upper-inner quadrant of left female breast [facility]) Staging form: Breast, AJCC 8th Edition - Clinical stage from 02/01/2020: Stage IB (cT1c, cN0, cM0, G3, ER-, PR-, HER2-) - Signed by Lloyd Huger, MD on 02/01/2020   Lloyd Huger, MD   10/26/2020 6:42 AM

## 2020-10-23 ENCOUNTER — Other Ambulatory Visit: Payer: Self-pay

## 2020-10-23 ENCOUNTER — Inpatient Hospital Stay: Payer: Medicaid Other | Attending: Oncology | Admitting: Oncology

## 2020-10-23 ENCOUNTER — Inpatient Hospital Stay: Payer: Medicaid Other

## 2020-10-23 ENCOUNTER — Encounter: Payer: Self-pay | Admitting: Oncology

## 2020-10-23 VITALS — BP 136/93 | HR 94 | Temp 97.8°F | Resp 20 | Wt 100.6 lb

## 2020-10-23 DIAGNOSIS — F17210 Nicotine dependence, cigarettes, uncomplicated: Secondary | ICD-10-CM | POA: Diagnosis not present

## 2020-10-23 DIAGNOSIS — F419 Anxiety disorder, unspecified: Secondary | ICD-10-CM | POA: Diagnosis not present

## 2020-10-23 DIAGNOSIS — C50212 Malignant neoplasm of upper-inner quadrant of left female breast: Secondary | ICD-10-CM | POA: Insufficient documentation

## 2020-10-23 DIAGNOSIS — G4700 Insomnia, unspecified: Secondary | ICD-10-CM | POA: Diagnosis not present

## 2020-10-23 DIAGNOSIS — Z171 Estrogen receptor negative status [ER-]: Secondary | ICD-10-CM | POA: Diagnosis not present

## 2020-10-23 DIAGNOSIS — F32A Depression, unspecified: Secondary | ICD-10-CM | POA: Insufficient documentation

## 2020-10-23 DIAGNOSIS — Z95828 Presence of other vascular implants and grafts: Secondary | ICD-10-CM

## 2020-10-23 MED ORDER — HEPARIN SOD (PORK) LOCK FLUSH 100 UNIT/ML IV SOLN
500.0000 [IU] | Freq: Once | INTRAVENOUS | Status: AC
  Administered 2020-10-23: 500 [IU] via INTRAVENOUS
  Filled 2020-10-23: qty 5

## 2020-10-23 MED ORDER — SODIUM CHLORIDE 0.9% FLUSH
10.0000 mL | Freq: Once | INTRAVENOUS | Status: AC
  Administered 2020-10-23: 10 mL via INTRAVENOUS
  Filled 2020-10-23: qty 10

## 2020-10-24 ENCOUNTER — Other Ambulatory Visit: Payer: Medicaid Other

## 2020-10-24 ENCOUNTER — Ambulatory Visit: Payer: Medicaid Other | Admitting: Oncology

## 2020-11-14 ENCOUNTER — Encounter: Payer: Self-pay | Admitting: Radiation Oncology

## 2020-11-14 ENCOUNTER — Telehealth: Payer: Self-pay | Admitting: Family Medicine

## 2020-11-15 ENCOUNTER — Ambulatory Visit
Admission: RE | Admit: 2020-11-15 | Discharge: 2020-11-15 | Disposition: A | Discharge status: Home / Self Care | Payer: Medicaid Other | Source: Ambulatory Visit | Attending: Radiation Oncology | Admitting: Radiation Oncology

## 2020-11-15 ENCOUNTER — Inpatient Hospital Stay: Payer: Medicaid Other

## 2020-11-15 DIAGNOSIS — C50212 Malignant neoplasm of upper-inner quadrant of left female breast: Secondary | ICD-10-CM

## 2020-11-15 DIAGNOSIS — Z171 Estrogen receptor negative status [ER-]: Secondary | ICD-10-CM

## 2020-11-24 ENCOUNTER — Other Ambulatory Visit: Payer: Self-pay | Admitting: *Deleted

## 2020-11-24 MED ORDER — ALPRAZOLAM 0.5 MG PO TABS: 0.5000 mg | ORAL_TABLET | Freq: Every evening | ORAL | 0 refills | Status: DC | PRN

## 2020-12-04 ENCOUNTER — Inpatient Hospital Stay: Payer: Medicaid Other | Attending: Oncology

## 2020-12-04 ENCOUNTER — Ambulatory Visit: Payer: Medicaid Other | Admitting: Radiation Oncology

## 2020-12-06 ENCOUNTER — Inpatient Hospital Stay: Payer: Medicaid Other

## 2020-12-18 ENCOUNTER — Inpatient Hospital Stay: Payer: Medicaid Other

## 2020-12-18 ENCOUNTER — Ambulatory Visit: Payer: Medicaid Other | Admitting: Radiation Oncology

## 2020-12-25 ENCOUNTER — Ambulatory Visit: Payer: Medicaid Other | Admitting: Radiation Oncology

## 2020-12-25 ENCOUNTER — Inpatient Hospital Stay: Payer: Medicaid Other

## 2021-01-01 ENCOUNTER — Ambulatory Visit
Admission: RE | Admit: 2021-01-01 | Discharge: 2021-01-01 | Disposition: A | Discharge status: Home / Self Care | Payer: Medicaid Other | Source: Ambulatory Visit | Attending: Radiation Oncology | Admitting: Radiation Oncology

## 2021-01-01 ENCOUNTER — Encounter: Payer: Self-pay | Admitting: Radiation Oncology

## 2021-01-01 VITALS — Temp 95.0°F | Wt 108.0 lb

## 2021-01-01 DIAGNOSIS — Z171 Estrogen receptor negative status [ER-]: Secondary | ICD-10-CM

## 2021-01-01 DIAGNOSIS — C50412 Malignant neoplasm of upper-outer quadrant of left female breast: Secondary | ICD-10-CM

## 2021-01-01 NOTE — Progress Notes (Signed)
Radiation Oncology Follow up Note  Name: Deanna Ramsey   Date:   01/01/2021 MRN:  [id number] DOB: 1966/11/13    This 55 y.o. female presents to the clinic today for 1 month follow-up status post whole breast radiation to her left breast for triple negative.  Stage I invasive mammary carcinoma  REFERRING PROVIDER: Letta Median, MD  HPI: Patient is a 55 year old female now out 1 month having completed whole breast radiation to her left breast for triple negative invasive mammary carcinoma.  Seen today in routine follow-up she is doing well.  She specifically denies any breast tenderness cough or bone pain..  She is not currently on antiestrogen therapy based on the triple negative nature of her disease.  COMPLICATIONS OF TREATMENT: none  FOLLOW UP COMPLIANCE: keeps appointments   PHYSICAL EXAM:  Temp (!) 95 F (35 C) (Tympanic)   Wt 108 lb (49 kg)   LMP 02/28/2012   BMI 17.70 kg/m  Lungs are clear to A&P cardiac examination essentially unremarkable with regular rate and rhythm. No dominant mass or nodularity is noted in either breast in 2 positions examined. Incision is well-healed. No axillary or supraclavicular adenopathy is appreciated. Cosmetic result is excellent.  Well-developed well-nourished patient in NAD. HEENT reveals PERLA, EOMI, discs not visualized.  Oral cavity is clear. No oral mucosal lesions are identified. Neck is clear without evidence of cervical or supraclavicular adenopathy. Lungs are clear to A&P. Cardiac examination is essentially unremarkable with regular rate and rhythm without murmur rub or thrill. Abdomen is benign with no organomegaly or masses noted. Motor sensory and DTR levels are equal and symmetric in the upper and lower extremities. Cranial nerves II through XII are grossly intact. Proprioception is intact. No peripheral adenopathy or edema is identified. No motor or sensory levels are noted. Crude visual fields are within normal range.  RADIOLOGY  RESULTS: No current films to review  PLAN: Patient is currently 1 month out from whole breast radiation doing well with very low side effect profile.  I am pleased with her overall progress.  I have asked to see her back in 4 to 5 months for follow-up.  Patient knows to call with any concerns.  I would like to take this opportunity to thank you for allowing me to participate in the care of your patient.Noreene Filbert, MD

## 2021-01-03 DIAGNOSIS — Z171 Estrogen receptor negative status [ER-]: Secondary | ICD-10-CM

## 2021-01-03 DIAGNOSIS — C50212 Malignant neoplasm of upper-inner quadrant of left female breast: Secondary | ICD-10-CM

## 2021-01-03 NOTE — Progress Notes (Signed)
Survivorship Care Plan visit completed.  Treatment summary given to patient on 01/01/2021 by oncology registration.  ASCO answers booklet given to patient.  CARE program and Cancer Transitions given in packet to patient along with other resources cancer center offers to patients and caregivers.  Attempted to call patient to review packet but no answer.  Left message asking patient to call me.

## 2021-01-20 NOTE — Progress Notes (Signed)
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  INTERVAL HISTORY: Patient returns to clinic today for routine 86-monthevaluation.  She currently feels well and is asymptomatic and is back to her baseline.  She no longer has any weakness or fatigue.  Her skin breakdown from XRT has resolved. She has no neurologic complaints.  She has a good appetite and denies weight loss.  She has no chest pain, shortness of breath, or hemoptysis.  She denies any nausea, vomiting, constipation, or diarrhea.  She has no urinary complaints.  Patient offers no specific complaints today.  REVIEW OF SYSTEMS:   Review of Systems  Constitutional: Negative.  Negative for fever, malaise/fatigue and weight loss.  HENT: Negative for congestion.   Respiratory: Negative.  Negative for cough, hemoptysis and shortness of breath.   Cardiovascular: Negative.  Negative for chest pain and leg swelling.  Gastrointestinal: Negative.  Negative for abdominal pain, diarrhea and nausea.  Genitourinary: Negative.  Negative for dysuria.  Musculoskeletal: Negative.  Negative for back pain.  Skin: Negative.  Negative for rash.  Neurological: Negative.  Negative for dizziness, focal weakness, weakness and headaches.  Psychiatric/Behavioral: Negative.  The patient is not nervous/anxious and does not have insomnia.      As per HPI. Otherwise, a complete review of systems is negative.  PAST MEDICAL HISTORY: Past Medical History:  Diagnosis Date  . Arthritis   . [place] 01[place]   left breast triple neagative IMC, HG DCIS  . Cellulitis   . [place]   . Depression   . History of kidney stones   . History of tracheostomy 2000   stated that epiglottis swelled up for an unknown reason; had trach for about a week.   . Hypertension   . Personal history of chemotherapy 2021   current  . Pneumonia     PAST SURGICAL HISTORY: Past Surgical History:  Procedure Laterality Date  . arm surgery  1984   fractured ulna  . BREAST BIOPSY Left [place]   [place]  . BREAST LUMPECTOMY Left 02/04/2020   triple negative, IMC and HG DCIS with positive margins,  6 LN's negative   . BREAST LUMPECTOMY Left 02/24/2020   re excision for margins  . BREAST LUMPECTOMY WITH SENTINEL LYMPH NODE BIOPSY Left 02/04/2020   Procedure: BREAST LUMPECTOMY WITH SENTINEL LYMPH NODE BX;  Surgeon: PJules Husbands MD;  Location: [facility] ORS;  Service: General;  Laterality: Left;  . CLEFT LIP REPAIR    . FRACTURE SURGERY    . kidney stones    . lymphnode neck    . PORTACATH PLACEMENT N/A 02/04/2020   Procedure: INSERTION PORT-A-CATH;  Surgeon: PJules Husbands MD;  Location: [facility] ORS;  Service: General;  Laterality: N/A;  . RE-EXCISION OF BREAST LUMPECTOMY Left 02/24/2020   Procedure: RE-EXCISION OF Left BREAST LUMPECTOMY;  Surgeon: PJules Husbands MD;  Location: [facility] ORS;  Service:  General;  Laterality: Left;  . TYMPANOSTOMY TUBE PLACEMENT      FAMILY HISTORY: Family History  Problem Relation Age of Onset  . COPD Other   . COPD Mother   . Hypertension Mother   . COPD Father   . Breast cancer Neg Hx     ADVANCED DIRECTIVES (Y/N):  N  HEALTH MAINTENANCE: Social History   Tobacco Use  . Smoking status: Current Every Day Smoker    Packs/day: 1.00    Types:  Cigarettes  . Smokeless tobacco: Never Used  Vaping Use  . Vaping Use: Never used  Substance Use Topics  . Alcohol use: No  . Drug use: No     Colonoscopy:  PAP:  Bone density:  Lipid panel:  Allergies  Allergen Reactions  . Sulfa Antibiotics Nausea And Vomiting    Current Outpatient Medications  Medication Sig Dispense Refill  . acetaminophen (TYLENOL) 325 MG tablet Take 650 mg by mouth every 6 (six) hours as needed for moderate pain.    Marland Kitchen albuterol (PROVENTIL HFA;VENTOLIN HFA) 108 (90 Base) MCG/ACT inhaler Inhale 2 puffs into the lungs every 6 (six) hours as needed for wheezing or shortness of breath. 1 Inhaler 2  . ALPRAZolam (XANAX) 0.5 MG tablet Take 1 tablet (0.5 mg total) by mouth at bedtime as needed for anxiety. 30 tablet 0  . lidocaine-prilocaine (EMLA) cream Apply to affected area once 30 g 3  . prochlorperazine (COMPAZINE) 10 MG tablet Take 1 tablet (10 mg total) by mouth every 6 (six) hours as needed (Nausea or vomiting). 60 tablet 2  . cetirizine (ZYRTEC) 10 MG tablet Take 10 mg by mouth daily.  (Patient not taking: Reported on 01/23/2021)    . guaiFENesin (MUCINEX) 600 MG 12 hr tablet Take 600 mg by mouth 2 (two) times daily.  (Patient not taking: Reported on 01/23/2021)    . lisinopril-hydrochlorothiazide (ZESTORETIC) 20-25 MG tablet Take 1 tablet by mouth daily. (Patient not taking: Reported on 01/23/2021)    . mupirocin ointment (BACTROBAN) 2 % Place 1 application into the nose 2 (two) times daily. (Patient not taking: Reported on 01/23/2021) 22 g 0  . ondansetron (ZOFRAN) 4 MG tablet Take 4 mg by mouth every 8 (eight) hours as needed for nausea or vomiting. (Patient not taking: Reported on 01/23/2021)    . silver sulfADIAZINE (SILVADENE) 1 % cream Apply 1 application topically 2 (two) times daily. (Patient not taking: Reported on 01/23/2021) 85 g 2   No current facility-administered medications for this visit.   Facility-Administered Medications Ordered in Other Visits   Medication Dose Route Frequency Provider Last Rate Last Admin  . sodium chloride flush (NS) 0.9 % injection 10 mL  10 mL Intravenous PRN Lloyd Huger, MD   10 mL at 06/01/20 0905  . sodium chloride flush (NS) 0.9 % injection 10 mL  10 mL Intravenous PRN Lloyd Huger, MD   10 mL at 08/24/20 0900    OBJECTIVE: Vitals:   01/23/21 1109  BP: 117/86  Pulse: 72  Resp: 18  Temp: 97.8 F (36.6 C)     Body mass index is 17.26 kg/m.    ECOG FS:0 - Asymptomatic  General: Well-developed, well-nourished, no acute distress. Eyes: Pink conjunctiva, anicteric sclera. HEENT: Normocephalic, moist mucous membranes. Breasts: Exam deferred today. Lungs: No audible wheezing or coughing. Heart: Regular rate and rhythm. Abdomen: Soft, nontender, no obvious distention. Musculoskeletal: No edema, cyanosis, or clubbing. Neuro: Alert, answering all questions appropriately. Cranial nerves grossly intact. Skin:  No rashes or petechiae noted. Psych: Normal affect.   LAB RESULTS:  Lab Results  Component Value Date   NA 136 08/24/2020   K 4.4 08/24/2020   CL 105 08/24/2020   CO2 24 08/24/2020   GLUCOSE 93 08/24/2020   BUN 22 (H) 08/24/2020   CREATININE 0.57 08/24/2020   CALCIUM 8.7 (L) 08/24/2020   PROT 6.5 08/24/2020   ALBUMIN 3.4 (L) 08/24/2020   AST 16 08/24/2020   ALT 12 08/24/2020   ALKPHOS 75 08/24/2020   BILITOT 0.5 08/24/2020   GFRNONAA >60 08/24/2020   GFRAA >60 08/24/2020    Lab Results  Component Value Date   WBC 7.7 09/25/2020   NEUTROABS 5.2 08/24/2020   HGB 15.0 09/25/2020   HCT 43.9 09/25/2020   MCV 95.9 09/25/2020   PLT 307 09/25/2020     STUDIES: No results found.  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  PLAN:    1.  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 Because of  positive margins she underwent reexcision on February 24, 2020 with clear margins.  She has now had a port placed as well.  MUGA scan from March 22, 2020 reported an EF of 72%. Given the triple negative status of her disease, she will receive adjuvant chemotherapy using Adriamycin and Cytoxan with Udenyca support followed by weekly Taxol x12.  She completed her chemotherapy on August 24, 2020.  Patient has now completed adjuvant XRT.  An aromatase inhibitor would not offer any benefit given the triple negative status of her disease.  No intervention is needed at this time.  Return to clinic in 6 months for routine evaluation.  Patient's next mammogram will be due in September 2022. 2.  Anxiety/insomnia: Continue evaluation and treatment per primary care. 3.  Migraines: Resolved.  Patient was previously given a referral to neurology for further evaluation. 4.  Depression: Resolved.  Patient declined initiation of antidepressant medication or referral for counseling. 5.  Breast erythema: Secondary to XRT, resolved. 6.  Genetics: Results pending at time of dictation.  Patient expressed understanding and was in agreement with this plan. She also understands that She can call clinic at any time with any questions, concerns, or complaints.   Cancer Staging Carcinoma of upper-inner quadrant of left female breast [facility]) Staging form: Breast, AJCC 8th Edition - Clinical stage from 02/01/2020: Stage IB (cT1c, cN0, cM0, G3, ER-, PR-, HER2-) - Signed by Lloyd Huger, MD on 02/01/2020 Stage prefix: Initial diagnosis Histologic grading system: 3 grade system   Lloyd Huger, MD   01/24/2021 6:04 AM

## 2021-01-22 ENCOUNTER — Other Ambulatory Visit: Payer: Self-pay

## 2021-01-22 DIAGNOSIS — C50212 Malignant neoplasm of upper-inner quadrant of left female breast: Secondary | ICD-10-CM

## 2021-01-23 ENCOUNTER — Inpatient Hospital Stay: Payer: Medicaid Other

## 2021-01-23 ENCOUNTER — Inpatient Hospital Stay: Payer: Medicaid Other | Attending: Oncology | Admitting: Oncology

## 2021-01-23 ENCOUNTER — Encounter: Payer: Self-pay | Admitting: Oncology

## 2021-01-23 VITALS — BP 117/86 | HR 72 | Temp 97.8°F | Resp 18 | Wt 105.3 lb

## 2021-01-23 DIAGNOSIS — F17210 Nicotine dependence, cigarettes, uncomplicated: Secondary | ICD-10-CM | POA: Diagnosis not present

## 2021-01-23 DIAGNOSIS — C50212 Malignant neoplasm of upper-inner quadrant of left female breast: Secondary | ICD-10-CM | POA: Insufficient documentation

## 2021-01-23 DIAGNOSIS — Z171 Estrogen receptor negative status [ER-]: Secondary | ICD-10-CM | POA: Diagnosis not present

## 2021-01-23 MED ORDER — HEPARIN SOD (PORK) LOCK FLUSH 100 UNIT/ML IV SOLN
INTRAVENOUS | Status: AC
  Filled 2021-01-23: qty 5

## 2021-01-23 NOTE — Progress Notes (Unsigned)
Pt in for follow up, denies any concerns today. 

## 2021-02-06 ENCOUNTER — Other Ambulatory Visit: Payer: Self-pay

## 2021-02-06 ENCOUNTER — Ambulatory Visit
Admission: RE | Admit: 2021-02-06 | Discharge: 2021-02-06 | Disposition: A | Discharge status: Home / Self Care | Payer: Medicaid Other | Source: Ambulatory Visit | Attending: Surgery | Admitting: Surgery

## 2021-02-06 DIAGNOSIS — C50212 Malignant neoplasm of upper-inner quadrant of left female breast: Secondary | ICD-10-CM | POA: Diagnosis not present

## 2021-02-21 ENCOUNTER — Ambulatory Visit: Payer: Medicaid Other | Admitting: Surgery

## 2021-03-13 ENCOUNTER — Inpatient Hospital Stay: Payer: Medicaid Other | Attending: Oncology

## 2021-03-13 DIAGNOSIS — Z171 Estrogen receptor negative status [ER-]: Secondary | ICD-10-CM | POA: Insufficient documentation

## 2021-03-13 DIAGNOSIS — C50212 Malignant neoplasm of upper-inner quadrant of left female breast: Secondary | ICD-10-CM | POA: Insufficient documentation

## 2021-03-13 DIAGNOSIS — Z95828 Presence of other vascular implants and grafts: Secondary | ICD-10-CM

## 2021-03-13 DIAGNOSIS — Z452 Encounter for adjustment and management of vascular access device: Secondary | ICD-10-CM | POA: Insufficient documentation

## 2021-03-13 MED ORDER — HEPARIN SOD (PORK) LOCK FLUSH 100 UNIT/ML IV SOLN
500.0000 [IU] | Freq: Once | INTRAVENOUS | Status: AC
  Administered 2021-03-13: 500 [IU] via INTRAVENOUS
  Filled 2021-03-13: qty 5

## 2021-03-13 MED ORDER — SODIUM CHLORIDE 0.9% FLUSH
10.0000 mL | Freq: Once | INTRAVENOUS | Status: AC
  Administered 2021-03-13: 10 mL via INTRAVENOUS
  Filled 2021-03-13: qty 10

## 2021-03-13 MED ORDER — HEPARIN SOD (PORK) LOCK FLUSH 100 UNIT/ML IV SOLN
INTRAVENOUS | Status: AC
  Filled 2021-03-13: qty 5

## 2021-03-14 ENCOUNTER — Ambulatory Visit: Payer: Medicaid Other | Admitting: Surgery

## 2021-05-01 ENCOUNTER — Inpatient Hospital Stay: Payer: Medicaid Other | Attending: Oncology

## 2021-05-01 ENCOUNTER — Other Ambulatory Visit: Payer: Self-pay

## 2021-05-01 DIAGNOSIS — C50212 Malignant neoplasm of upper-inner quadrant of left female breast: Secondary | ICD-10-CM | POA: Diagnosis present

## 2021-05-01 DIAGNOSIS — Z452 Encounter for adjustment and management of vascular access device: Secondary | ICD-10-CM | POA: Insufficient documentation

## 2021-05-01 DIAGNOSIS — Z171 Estrogen receptor negative status [ER-]: Secondary | ICD-10-CM | POA: Diagnosis not present

## 2021-05-01 MED ORDER — HEPARIN SOD (PORK) LOCK FLUSH 100 UNIT/ML IV SOLN
500.0000 [IU] | Freq: Once | INTRAVENOUS | Status: AC | PRN
  Administered 2021-05-01: 500 [IU]
  Filled 2021-05-01: qty 5

## 2021-05-01 MED ORDER — HEPARIN SOD (PORK) LOCK FLUSH 100 UNIT/ML IV SOLN
INTRAVENOUS | Status: AC
  Filled 2021-05-01: qty 5

## 2021-05-01 MED ORDER — SODIUM CHLORIDE 0.9% FLUSH
10.0000 mL | Freq: Once | INTRAVENOUS | Status: AC | PRN
  Administered 2021-05-01: 10 mL
  Filled 2021-05-01: qty 10

## 2021-05-01 NOTE — Patient Instructions (Signed)
[facility]  Discharge Instructions: Thank you for choosing [facility] to provide your oncology and hematology care.  If you have a lab appointment with the [facility], please go directly to the [facility] and check in at the registration area.  Wear comfortable clothing and clothing appropriate for easy access to any Portacath or PICC line.   We strive to give you quality time with your provider. You may need to reschedule your appointment if you arrive late (15 or more minutes).  Arriving late affects you and other patients whose appointments are after yours.  Also, if you miss three or more appointments without notifying the office, you may be dismissed from the clinic at the provider's discretion.      For prescription refill requests, have your pharmacy contact our office and allow 72 hours for refills to be completed.    Today you received the following port flush    To help prevent nausea and vomiting after your treatment, we encourage you to take your nausea medication as directed.  BELOW ARE SYMPTOMS THAT SHOULD BE REPORTED IMMEDIATELY: . *FEVER GREATER THAN 100.4 F (38 C) OR HIGHER . *CHILLS OR SWEATING . *NAUSEA AND VOMITING THAT IS NOT CONTROLLED WITH YOUR NAUSEA MEDICATION . *UNUSUAL SHORTNESS OF BREATH . *UNUSUAL BRUISING OR BLEEDING . *URINARY PROBLEMS (pain or burning when urinating, or frequent urination) . *BOWEL PROBLEMS (unusual diarrhea, constipation, pain near the anus) . TENDERNESS IN MOUTH AND THROAT WITH OR WITHOUT PRESENCE OF ULCERS (sore throat, sores in mouth, or a toothache) . UNUSUAL RASH, SWELLING OR PAIN  . UNUSUAL VAGINAL DISCHARGE OR ITCHING   Items with * indicate a potential emergency and should be followed up as soon as possible or go to the Emergency Department if any problems should occur.  Please show the CHEMOTHERAPY ALERT CARD or IMMUNOTHERAPY ALERT CARD at check-in to the Emergency  Department and triage nurse.  Should you have questions after your visit or need to cancel or reschedule your appointment, please contact [facility]  [phone] and follow the prompts.  Office hours are 8:00 a.m. to 4:30 p.m. Monday - Friday. Please note that voicemails left after 4:00 p.m. may not be returned until the following business day.  We are closed weekends and major holidays. You have access to a nurse at all times for urgent questions. Please call the main number to the clinic [phone] and follow the prompts.  For any non-urgent questions, you may also contact your provider using MyChart. We now offer e-Visits for anyone 55 and older to request care online for non-urgent symptoms. For details visit [email or web address].   Also download the MyChart app! Go to the app store, search "MyChart", open the app, select [facility], and log in with your MyChart username and password.  Due to Covid, a mask is required upon entering the hospital/clinic. If you do not have a mask, one will be given to you upon arrival. For doctor visits, patients may have 1 support person aged 55 or older with them. For treatment visits, patients cannot have anyone with them due to current Covid guidelines and our immunocompromised population.

## 2021-06-01 ENCOUNTER — Ambulatory Visit: Payer: Medicaid Other | Admitting: Radiation Oncology

## 2021-06-19 ENCOUNTER — Other Ambulatory Visit: Payer: Self-pay

## 2021-06-19 ENCOUNTER — Inpatient Hospital Stay: Payer: Medicaid Other | Attending: Oncology

## 2021-06-19 DIAGNOSIS — Z452 Encounter for adjustment and management of vascular access device: Secondary | ICD-10-CM | POA: Diagnosis not present

## 2021-06-19 DIAGNOSIS — C50212 Malignant neoplasm of upper-inner quadrant of left female breast: Secondary | ICD-10-CM | POA: Diagnosis present

## 2021-06-19 DIAGNOSIS — Z171 Estrogen receptor negative status [ER-]: Secondary | ICD-10-CM | POA: Diagnosis not present

## 2021-06-19 DIAGNOSIS — Z95828 Presence of other vascular implants and grafts: Secondary | ICD-10-CM

## 2021-06-19 MED ORDER — HEPARIN SOD (PORK) LOCK FLUSH 100 UNIT/ML IV SOLN
INTRAVENOUS | Status: AC
  Filled 2021-06-19: qty 5

## 2021-06-19 MED ORDER — SODIUM CHLORIDE 0.9% FLUSH
10.0000 mL | Freq: Once | INTRAVENOUS | Status: AC
  Administered 2021-06-19: 10 mL
  Filled 2021-06-19: qty 10

## 2021-06-19 MED ORDER — HEPARIN SOD (PORK) LOCK FLUSH 100 UNIT/ML IV SOLN
500.0000 [IU] | Freq: Once | INTRAVENOUS | Status: AC
Start: 2021-06-19 — End: 2021-06-19
  Administered 2021-06-19: 500 [IU]
  Filled 2021-06-19: qty 5

## 2021-06-19 NOTE — Patient Instructions (Signed)
[facility]  Discharge Instructions: Thank you for choosing [facility] to provide your oncology and hematology care.  If you have a lab appointment with the [facility], please go directly to the [facility] and check in at the registration area.  Wear comfortable clothing and clothing appropriate for easy access to any Portacath or PICC line.   We strive to give you quality time with your provider. You may need to reschedule your appointment if you arrive late (15 or more minutes).  Arriving late affects you and other patients whose appointments are after yours.  Also, if you miss three or more appointments without notifying the office, you may be dismissed from the clinic at the provider's discretion.      For prescription refill requests, have your pharmacy contact our office and allow 72 hours for refills to be completed.    Today you received the following : port flush     To help prevent nausea and vomiting after your treatment, we encourage you to take your nausea medication as directed.  BELOW ARE SYMPTOMS THAT SHOULD BE REPORTED IMMEDIATELY: *FEVER GREATER THAN 100.4 F (38 C) OR HIGHER *CHILLS OR SWEATING *NAUSEA AND VOMITING THAT IS NOT CONTROLLED WITH YOUR NAUSEA MEDICATION *UNUSUAL SHORTNESS OF BREATH *UNUSUAL BRUISING OR BLEEDING *URINARY PROBLEMS (pain or burning when urinating, or frequent urination) *BOWEL PROBLEMS (unusual diarrhea, constipation, pain near the anus) TENDERNESS IN MOUTH AND THROAT WITH OR WITHOUT PRESENCE OF ULCERS (sore throat, sores in mouth, or a toothache) UNUSUAL RASH, SWELLING OR PAIN  UNUSUAL VAGINAL DISCHARGE OR ITCHING   Items with * indicate a potential emergency and should be followed up as soon as possible or go to the Emergency Department if any problems should occur.  Please show the CHEMOTHERAPY ALERT CARD or IMMUNOTHERAPY ALERT CARD at check-in to the Emergency Department and triage  nurse.  Should you have questions after your visit or need to cancel or reschedule your appointment, please contact [facility]  [phone] and follow the prompts.  Office hours are 8:00 a.m. to 4:30 p.m. Monday - Friday. Please note that voicemails left after 4:00 p.m. may not be returned until the following business day.  We are closed weekends and major holidays. You have access to a nurse at all times for urgent questions. Please call the main number to the clinic [phone] and follow the prompts.  For any non-urgent questions, you may also contact your provider using MyChart. We now offer e-Visits for anyone 5 and older to request care online for non-urgent symptoms. For details visit [email or web address].   Also download the MyChart app! Go to the app store, search "MyChart", open the app, select [facility], and log in with your MyChart username and password.  Due to Covid, a mask is required upon entering the hospital/clinic. If you do not have a mask, one will be given to you upon arrival. For doctor visits, patients may have 1 support person aged 32 or older with them. For treatment visits, patients cannot have anyone with them due to current Covid guidelines and our immunocompromised population.

## 2021-07-21 NOTE — Progress Notes (Signed)
[facility]  [phone] [phone]) [phone]  ID: Deanna Ramsey OB: 1966-10-30  MR#: [id number]  GMW#:[id number]  Patient Care Team: Letta Median, MD as PCP - General (Family Medicine) Rico Junker, RN as Registered Nurse Theodore Demark, RN as Oncology Nurse Navigator Pabon, Marjory Lies, MD as Consulting Physician (General Surgery) Lloyd Huger, MD as Consulting Physician (Oncology) Anabel Bene, MD as Referring Physician (Neurology) Noreene Filbert, MD as Radiation Oncologist (Radiation Oncology)  I connected with Deanna Ramsey on 07/25/21 at  2:00 PM EDT by video enabled telemedicine visit and verified that I am speaking with the correct person using two identifiers.   I discussed the limitations, risks, security and privacy concerns of performing an evaluation and management service by telemedicine and the availability of in-person appointments. I also discussed with the patient that there may be a patient responsible charge related to this service. The patient expressed understanding and agreed to proceed.   Other persons participating in the visit and their role in the encounter: Patient, MD.  Patient's location: Home. Provider's location: Clinic.  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  INTERVAL HISTORY: Patient agreed to video assisted telemedicine visit for routine 71-monthevaluation.  Visit was converted to telephone only secondary to technical difficulties.  She currently feels well and is asymptomatic.  She denies any weakness or fatigue.  She has no neurologic complaints.  She has a good appetite and denies weight loss.  She has no chest pain, shortness of breath, or hemoptysis.  She denies any nausea, vomiting, constipation, or diarrhea.  She has no urinary complaints.  Patient feels at her baseline offers no specific complaints today.  REVIEW OF  SYSTEMS:   Review of Systems  Constitutional: Negative.  Negative for fever, malaise/fatigue and weight loss.  HENT:  Negative for congestion.   Respiratory: Negative.  Negative for cough, hemoptysis and shortness of breath.   Cardiovascular: Negative.  Negative for chest pain and leg swelling.  Gastrointestinal: Negative.  Negative for abdominal pain, diarrhea and nausea.  Genitourinary: Negative.  Negative for dysuria.  Musculoskeletal: Negative.  Negative for back pain.  Skin: Negative.  Negative for rash.  Neurological: Negative.  Negative for dizziness, focal weakness, weakness and headaches.  Psychiatric/Behavioral: Negative.  The patient is not nervous/anxious and does not have insomnia.    As per HPI. Otherwise, a complete review of systems is negative.  PAST MEDICAL HISTORY: Past Medical History:  Diagnosis Date   Arthritis    [place]ast [place]   left breast triple neagative IMC, HG DCIS   Cellulitis    COPD (chronic obstructive pulmonary disease) (HCC)    Depression    History of kidney stones    History of tracheostomy 2000   stated that epiglottis swelled up for an unknown reason; had trach for about a week.    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   Pneumonia     PAST SURGICAL HISTORY: Past Surgical History:  Procedure Laterality Date   arm surgery  1984   fractured ulna   BREAST [place]SY Left [place]   BREAST LUMPECTOMY Left 02/04/2020   triple negative, IMC and HG DCIS with positive margins,  6 LN's negative  BREAST LUMPECTOMY Left 02/24/2020   re excision for margins   BREAST LUMPECTOMY WITH SENTINEL LYMPH NODE BIOPSY Left 02/04/2020   Procedure: BREAST LUMPECTOMY WITH SENTINEL LYMPH NODE BX;  Surgeon: Jules Husbands, MD;  Location: [facility] ORS;  Service: General;  Laterality: Left;   CLEFT LIP REPAIR      FRACTURE SURGERY     kidney stones     lymphnode neck     PORTACATH PLACEMENT N/A 02/04/2020   Procedure: INSERTION PORT-A-CATH;  Surgeon: Jules Husbands, MD;  Location: [facility] ORS;  Service: General;  Laterality: N/A;   RE-EXCISION OF BREAST LUMPECTOMY Left 02/24/2020   Procedure: RE-EXCISION OF Left BREAST LUMPECTOMY;  Surgeon: Jules Husbands, MD;  Location: [facility] ORS;  Service: General;  Laterality: Left;   TYMPANOSTOMY TUBE PLACEMENT      FAMILY HISTORY: Family History  Problem Relation Age of Onset   COPD Other    COPD Mother    Hypertension Mother    COPD Father    Breast cancer Neg Hx     ADVANCED DIRECTIVES (Y/N):  N  HEALTH MAINTENANCE: Social History   Tobacco Use   Smoking status: Every Day    Packs/day: 1.00    Types: Cigarettes   Smokeless tobacco: Never  Vaping Use   Vaping Use: Never used  Substance Use Topics   Alcohol use: No   Drug use: No     Colonoscopy:  PAP:  Bone density:  Lipid panel:  Allergies  Allergen Reactions   Sulfa Antibiotics Nausea And Vomiting    Current Outpatient Medications  Medication Sig Dispense Refill   acetaminophen (TYLENOL) 325 MG tablet Take 650 mg by mouth every 6 (six) hours as needed for moderate pain.     albuterol (PROVENTIL HFA;VENTOLIN HFA) 108 (90 Base) MCG/ACT inhaler Inhale 2 puffs into the lungs every 6 (six) hours as needed for wheezing or shortness of breath. 1 Inhaler 2   lidocaine-prilocaine (EMLA) cream Apply to affected area once 30 g 3   ALPRAZolam (XANAX) 0.5 MG tablet Take 1 tablet (0.5 mg total) by mouth at bedtime as needed for anxiety. (Patient not taking: Reported on 07/24/2021) 30 tablet 0   cetirizine (ZYRTEC) 10 MG tablet Take 10 mg by mouth daily.  (Patient not taking: Reported on 07/24/2021)     guaiFENesin (MUCINEX) 600 MG 12 hr tablet Take 600 mg by mouth 2 (two) times daily.  (Patient not taking: No sig reported)     lisinopril-hydrochlorothiazide (ZESTORETIC) 20-25 MG tablet Take 1 tablet by  mouth daily. (Patient not taking: No sig reported)     mupirocin ointment (BACTROBAN) 2 % Place 1 application into the nose 2 (two) times daily. (Patient not taking: No sig reported) 22 g 0   ondansetron (ZOFRAN) 4 MG tablet Take 4 mg by mouth every 8 (eight) hours as needed for nausea or vomiting. (Patient not taking: No sig reported)     prochlorperazine (COMPAZINE) 10 MG tablet Take 1 tablet (10 mg total) by mouth every 6 (six) hours as needed (Nausea or vomiting). 60 tablet 2   silver sulfADIAZINE (SILVADENE) 1 % cream Apply 1 application topically 2 (two) times daily. (Patient not taking: No sig reported) 85 g 2   No current facility-administered medications for this visit.   Facility-Administered Medications Ordered in Other Visits  Medication Dose Route Frequency Provider Last Rate Last Admin   sodium chloride flush (NS) 0.9 % injection 10 mL  10 mL Intravenous PRN [name] [name], MD  10 mL at 06/01/20 0905   sodium chloride flush (NS) 0.9 % injection 10 mL  10 mL Intravenous PRN Lloyd Huger, MD   10 mL at 08/24/20 0900    OBJECTIVE: There were no vitals filed for this visit.    There is no height or weight on file to calculate BMI.    ECOG FS:0 - Asymptomatic  General: Well-developed, well-nourished, no acute distress. HEENT: Normocephalic. Neuro: Alert, answering all questions appropriately. Cranial nerves grossly intact. Psych: Normal affect.   LAB RESULTS:  Lab Results  Component Value Date   NA 138 07/23/2021   K 4.2 07/23/2021   CL 104 07/23/2021   CO2 25 07/23/2021   GLUCOSE 122 (H) 07/23/2021   BUN 19 07/23/2021   CREATININE 0.76 07/23/2021   CALCIUM 8.7 (L) 07/23/2021   PROT 7.2 07/23/2021   ALBUMIN 4.0 07/23/2021   AST 19 07/23/2021   ALT 12 07/23/2021   ALKPHOS 80 07/23/2021   BILITOT 0.9 07/23/2021   GFRNONAA >60 07/23/2021   GFRAA >60 08/24/2020    Lab Results  Component Value Date   WBC 7.2 07/23/2021   NEUTROABS 4.5 07/23/2021    HGB 14.3 07/23/2021   HCT 42.6 07/23/2021   MCV 98.6 07/23/2021   PLT 290 07/23/2021     STUDIES: No results found.  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  PLAN:    1.  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 Because of positive margins she underwent reexcision on February 24, 2020 with clear margins.  MUGA scan from March 22, 2020 reported an EF of 72%. Given the triple negative status of her disease, she will receive adjuvant chemotherapy using Adriamycin and Cytoxan with Udenyca support followed by weekly Taxol x12.  She completed her chemotherapy on August 24, 2020.  Patient has now completed adjuvant XRT.  An aromatase inhibitor would not offer any benefit given the triple negative status of her disease.  No intervention is needed at this time.  Return to clinic in 6 months for routine evaluation.  Patient's next mammogram will be due in September 2022. 2.  Anxiety/insomnia: Patient does not complain of this today.  Continue evaluation and treatment per primary care. 3.  Migraines: Resolved.  Patient was previously given a referral to neurology for further evaluation. 4.  Depression: Resolved.  Patient declined initiation of antidepressant medication or referral for counseling. 5.  Breast erythema: Resolved. 6.  Genetics: Results pending at time of dictation.  I provided 20 minutes of face-to-face video visit time during this encounter which included chart review, counseling, and coordination of care as documented above.   Patient expressed understanding and was in agreement with this plan. She also understands that She can call clinic at any time with any questions, concerns, or complaints.   Cancer Staging Carcinoma of upper-inner quadrant of left female breast [facility]) Staging form: Breast, AJCC 8th  Edition - Clinical stage from 02/01/2020: Stage IB (cT1c, cN0, cM0, G3, ER-, PR-, HER2-) - Signed by Lloyd Huger, MD on 02/01/2020 Stage prefix: Initial diagnosis Histologic grading system: 3 grade system   Lloyd Huger, MD   07/25/2021 7:33 AM

## 2021-07-23 ENCOUNTER — Inpatient Hospital Stay: Payer: Medicaid Other

## 2021-07-23 ENCOUNTER — Other Ambulatory Visit: Payer: Self-pay

## 2021-07-23 DIAGNOSIS — Z171 Estrogen receptor negative status [ER-]: Secondary | ICD-10-CM | POA: Diagnosis not present

## 2021-07-23 DIAGNOSIS — C50212 Malignant neoplasm of upper-inner quadrant of left female breast: Secondary | ICD-10-CM

## 2021-07-23 DIAGNOSIS — Z95828 Presence of other vascular implants and grafts: Secondary | ICD-10-CM

## 2021-07-23 LAB — CBC WITH DIFFERENTIAL/PLATELET
Abs Immature Granulocytes: 0.02 10*3/uL (ref 0.00–0.07)
Basophils Absolute: 0.1 10*3/uL (ref 0.0–0.1)
Basophils Relative: 1 %
Eosinophils Absolute: 0.4 10*3/uL (ref 0.0–0.5)
Eosinophils Relative: 6 %
HCT: 42.6 % (ref 36.0–46.0)
Hemoglobin: 14.3 g/dL (ref 12.0–15.0)
Immature Granulocytes: 0 %
Lymphocytes Relative: 24 %
Lymphs Abs: 1.7 10*3/uL (ref 0.7–4.0)
MCH: 33.1 pg (ref 26.0–34.0)
MCHC: 33.6 g/dL (ref 30.0–36.0)
MCV: 98.6 fL (ref 80.0–100.0)
Monocytes Absolute: 0.5 10*3/uL (ref 0.1–1.0)
Monocytes Relative: 6 %
Neutro Abs: 4.5 10*3/uL (ref 1.7–7.7)
Neutrophils Relative %: 63 %
Platelets: 290 10*3/uL (ref 150–400)
RBC: 4.32 MIL/uL (ref 3.87–5.11)
RDW: 13.3 % (ref 11.5–15.5)
WBC: 7.2 10*3/uL (ref 4.0–10.5)
nRBC: 0 % (ref 0.0–0.2)

## 2021-07-23 LAB — COMPREHENSIVE METABOLIC PANEL
ALT: 12 U/L (ref 0–44)
AST: 19 U/L (ref 15–41)
Albumin: 4 g/dL (ref 3.5–5.0)
Alkaline Phosphatase: 80 U/L (ref 38–126)
Anion gap: 9 (ref 5–15)
BUN: 19 mg/dL (ref 6–20)
CO2: 25 mmol/L (ref 22–32)
Calcium: 8.7 mg/dL — ABNORMAL LOW (ref 8.9–10.3)
Chloride: 104 mmol/L (ref 98–111)
Creatinine, Ser: 0.76 mg/dL (ref 0.44–1.00)
GFR, Estimated: >60 mL/min (ref >60)
Glucose, Bld: 122 mg/dL — ABNORMAL HIGH (ref 70–99)
Potassium: 4.2 mmol/L (ref 3.5–5.1)
Sodium: 138 mmol/L (ref 135–145)
Total Bilirubin: 0.9 mg/dL (ref 0.3–1.2)
Total Protein: 7.2 g/dL (ref 6.5–8.1)

## 2021-07-23 MED ORDER — SODIUM CHLORIDE 0.9% FLUSH
10.0000 mL | Freq: Once | INTRAVENOUS | Status: AC
  Administered 2021-07-23: 10 mL via INTRAVENOUS
  Filled 2021-07-23: qty 10

## 2021-07-23 MED ORDER — HEPARIN SOD (PORK) LOCK FLUSH 100 UNIT/ML IV SOLN
INTRAVENOUS | Status: AC
  Administered 2021-07-23: 500 [IU]
  Filled 2021-07-23: qty 5

## 2021-07-23 MED ORDER — HEPARIN SOD (PORK) LOCK FLUSH 100 UNIT/ML IV SOLN
500.0000 [IU] | Freq: Once | INTRAVENOUS | Status: DC
  Filled 2021-07-23: qty 5

## 2021-07-23 NOTE — Patient Instructions (Signed)
[facility]  Discharge Instructions: Thank you for choosing [facility] to provide your oncology and hematology care.  If you have a lab appointment with the [facility], please go directly to the [facility] and check in at the registration area.  Wear comfortable clothing and clothing appropriate for easy access to any Portacath or PICC line.   We strive to give you quality time with your provider. You may need to reschedule your appointment if you arrive late (15 or more minutes).  Arriving late affects you and other patients whose appointments are after yours.  Also, if you miss three or more appointments without notifying the office, you may be dismissed from the clinic at the provider's discretion.      For prescription refill requests, have your pharmacy contact our office and allow 72 hours for refills to be completed.    Today you received the following chemotherapy and/or immunotherapy agents port flush      To help prevent nausea and vomiting after your treatment, we encourage you to take your nausea medication as directed.  BELOW ARE SYMPTOMS THAT SHOULD BE REPORTED IMMEDIATELY: *FEVER GREATER THAN 100.4 F (38 C) OR HIGHER *CHILLS OR SWEATING *NAUSEA AND VOMITING THAT IS NOT CONTROLLED WITH YOUR NAUSEA MEDICATION *UNUSUAL SHORTNESS OF BREATH *UNUSUAL BRUISING OR BLEEDING *URINARY PROBLEMS (pain or burning when urinating, or frequent urination) *BOWEL PROBLEMS (unusual diarrhea, constipation, pain near the anus) TENDERNESS IN MOUTH AND THROAT WITH OR WITHOUT PRESENCE OF ULCERS (sore throat, sores in mouth, or a toothache) UNUSUAL RASH, SWELLING OR PAIN  UNUSUAL VAGINAL DISCHARGE OR ITCHING   Items with * indicate a potential emergency and should be followed up as soon as possible or go to the Emergency Department if any problems should occur.  Please show the CHEMOTHERAPY ALERT CARD or IMMUNOTHERAPY ALERT CARD at check-in  to the Emergency Department and triage nurse.  Should you have questions after your visit or need to cancel or reschedule your appointment, please contact [facility]  [phone] and follow the prompts.  Office hours are 8:00 a.m. to 4:30 p.m. Monday - Friday. Please note that voicemails left after 4:00 p.m. may not be returned until the following business day.  We are closed weekends and major holidays. You have access to a nurse at all times for urgent questions. Please call the main number to the clinic [phone] and follow the prompts.  For any non-urgent questions, you may also contact your provider using MyChart. We now offer e-Visits for anyone 61 and older to request care online for non-urgent symptoms. For details visit [email or web address].   Also download the MyChart app! Go to the app store, search "MyChart", open the app, select [facility], and log in with your MyChart username and password.  Due to Covid, a mask is required upon entering the hospital/clinic. If you do not have a mask, one will be given to you upon arrival. For doctor visits, patients may have 1 support person aged 71 or older with them. For treatment visits, patients cannot have anyone with them due to current Covid guidelines and our immunocompromised population.

## 2021-07-24 ENCOUNTER — Inpatient Hospital Stay: Payer: Medicaid Other | Attending: Oncology | Admitting: Oncology

## 2021-07-24 ENCOUNTER — Other Ambulatory Visit: Payer: Self-pay

## 2021-07-24 DIAGNOSIS — C50212 Malignant neoplasm of upper-inner quadrant of left female breast: Secondary | ICD-10-CM | POA: Diagnosis not present

## 2021-07-24 DIAGNOSIS — Z171 Estrogen receptor negative status [ER-]: Secondary | ICD-10-CM | POA: Diagnosis not present

## 2021-07-24 MED ORDER — PROCHLORPERAZINE MALEATE 10 MG PO TABS: 10.0000 mg | ORAL_TABLET | Freq: Four times a day (QID) | ORAL | 2 refills | Status: DC | PRN

## 2021-07-24 NOTE — Progress Notes (Signed)
Patient states that she has occasional nausea for which she takes compazine and it helps. She would like a refill on this and the emla cream. She fell while mopping on Friday and sprained her wrist.

## 2021-07-25 ENCOUNTER — Encounter: Payer: Self-pay | Admitting: Oncology

## 2021-08-06 ENCOUNTER — Ambulatory Visit: Payer: Medicaid Other | Admitting: Surgery

## 2021-08-06 ENCOUNTER — Encounter: Payer: Self-pay | Admitting: Surgery

## 2021-08-06 ENCOUNTER — Other Ambulatory Visit: Payer: Self-pay

## 2021-08-06 VITALS — BP 134/88 | HR 91 | Temp 97.9°F | Ht 65.0 in | Wt 103.4 lb

## 2021-08-06 DIAGNOSIS — C50212 Malignant neoplasm of upper-inner quadrant of left female breast: Secondary | ICD-10-CM | POA: Diagnosis not present

## 2021-08-06 NOTE — Patient Instructions (Signed)
Please wait 48 hours before showering. Do not scrub over the area when showering. Please call the office if you have any questions or concerns.Try Tylenol and Ibuprofen for the discomfort /pain.     Implanted Port Removal Implanted port removal is a procedure to remove the port and catheter (port-a-cath) that is implanted under your skin. The port is a small disc under your skin that can be punctured with a needle. It is connected to a vein in your chest or neck by a small flexible tube (catheter). The port-a-cath is used for treatment through an IV tube and for taking blood samples. Your health care provider will remove the port-a-cath if: You no longer need it for treatment. It is not working properly. The area around it gets infected.  Tell a health care provider about: Any allergies you have. All medicines you are taking, including vitamins, herbs, eye drops, creams, and over-the-counter medicines. Any problems you or family members have had with anesthetic medicines. Any blood disorders you have. Any surgeries you have had. Any medical conditions you have. Whether you are pregnant or may be pregnant. What are the risks? Generally, this is a safe procedure. However, problems may occur, including: Infection. Bleeding. Allergic reactions to anesthetic medicines. Damage to nerves or blood vessels.  What happens before the procedure? You will have: A physical exam. Blood tests. Imaging tests, including a chest X-ray. Follow instructions from your health care provider about eating or drinking restrictions. Ask your health care provider about: Changing or stopping your regular medicines. This is especially important if you are taking diabetes medicines or blood thinners. Taking medicines such as aspirin and ibuprofen. These medicines can thin your blood. Do not take these medicines before your procedure if your surgeon instructs you not to. Ask your health care provider how your  surgical site will be marked or identified. You may be given antibiotic medicine to help prevent infection. Plan to have someone take you home after the procedure. If you will be going home right after the procedure, plan to have someone stay with you for 24 hours. What happens during the procedure? To reduce your risk of infection: Your health care team will wash or sanitize their hands. Your skin will be washed with soap. You may be given one or more of the following: A medicine to help you relax (sedative). A medicine to numb the area (local anesthetic). A small cut (incision) will be made at the site of your port-a-cath. The port-a-cath and the catheter that has been inside your vein will gently be removed. The incision will be closed with stitches (sutures), adhesive strips, or skin glue. A bandage (dressing) will be placed over the incision. The procedure may vary among health care providers and hospitals. What happens after the procedure? Your blood pressure, heart rate, breathing rate, and blood oxygen level will be monitored often until the medicines you were given have worn off. Do not drive for 24 hours if you received a sedative. This information is not intended to replace advice given to you by your health care provider. Make sure you discuss any questions you have with your health care provider. Document Released: 11/06/2015 Document Revised: 05/02/2016 Document Reviewed: 08/30/2015 Elsevier Interactive Patient Education  [employer].

## 2021-08-06 NOTE — Progress Notes (Signed)
Outpatient Surgical Follow Up  08/06/2021  Deanna Ramsey is an 55 y.o. female.   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 Also required re-excision of questionable positive margins. He has no complaints from her left breast.  No new masses no pain no lymphadenopathy. Last mammogram personally reviewed 02/2021 showing no concerning lesions.  Past Medical History:  Diagnosis Date   Arthritis    Breast cancer ([place]) 01/2020   left breast triple neagative IMC, HG DCIS   Cellulitis    COPD (chronic obstructive pulmonary disease) (HCC)    Depression    History of kidney stones    History of tracheostomy 2000   stated that epiglottis swelled up for an unknown reason; had trach for about a week.    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   Pneumonia     Past Surgical History:  Procedure Laterality Date   arm surgery  1984   fractured ulna   BREAST BIOPSY Left 01/2020   [place] 3:00 10cmfn [place] bx   BREAST LUMPECTOMY Left 02/04/2020   triple negative, IMC and HG DCIS with positive margins,  6 LN's negative    BREAST LUMPECTOMY Left 02/24/2020   re excision for margins   BREAST LUMPECTOMY WITH SENTINEL LYMPH NODE BIOPSY Left 02/04/2020   Procedure: BREAST LUMPECTOMY WITH SENTINEL LYMPH NODE BX;  Surgeon: Jules Husbands, MD;  Location: [facility] ORS;  Service: General;  Laterality: Left;   CLEFT LIP REPAIR     FRACTURE SURGERY     kidney stones     lymphnode neck     PORTACATH PLACEMENT N/A 02/04/2020   Procedure: INSERTION PORT-A-CATH;  Surgeon: Jules Husbands, MD;  Location: [facility] ORS;  Service: General;  Laterality: N/A;   RE-EXCISION OF BREAST LUMPECTOMY Left  02/24/2020   Procedure: RE-EXCISION OF Left BREAST LUMPECTOMY;  Surgeon: Jules Husbands, MD;  Location: [facility] ORS;  Service: General;  Laterality: Left;   TYMPANOSTOMY TUBE PLACEMENT      Family History  Problem Relation Age of Onset   COPD Other    COPD Mother    Hypertension Mother    COPD Father    Breast cancer Neg Hx     Social History:  reports that she has been smoking cigarettes. She has been smoking an average of 1 pack per day. She has never used smokeless tobacco. She reports that she does not drink alcohol and does not use drugs.  Allergies:  Allergies  Allergen Reactions   Sulfa Antibiotics Nausea And Vomiting    Medications reviewed.    ROS Full ROS performed and is otherwise negative other than what is stated in HPI   BP 134/88   Pulse 91   Temp 97.9 F (36.6 C) (Other (Comment))   Ht '5\' 5"'$  (1.651 m)   Wt 103 lb 6.4 oz (46.9 kg)   LMP 02/28/2012   SpO2 94%   BMI 17.21 kg/m   Physical Exam Vitals and nursing note reviewed. Exam conducted with a chaperone present.  Constitutional:      General: She is not in acute distress.    Appearance: Normal  appearance. She is normal weight.  Eyes:     General: No scleral icterus.       Right eye: No discharge.        Left eye: No discharge.  Pulmonary:     Effort: Pulmonary effort is normal.     Breath sounds: No stridor.     Comments: BREAST< prior left lumpectomy and SLNBx scars, no masses, no seromas. NO palpable or concerning lesions on either breast. Both axilla w/o LAD Abdominal:     General: Abdomen is flat. There is no distension.     Palpations: Abdomen is soft. There is no mass.     Tenderness: There is no abdominal tenderness. There is no guarding or rebound.     Hernia: No hernia is present.  Musculoskeletal:        General: No swelling or tenderness. Normal range of motion.     Cervical back: Normal range of motion and neck supple. No rigidity or tenderness.  Skin:    General: Skin is warm  and dry.     Capillary Refill: Capillary refill takes less than 2 seconds.  Neurological:     General: No focal deficit present.     Mental Status: She is alert and oriented to person, place, and time.  Psychiatric:        Mood and Affect: Mood normal.        Thought Content: Thought content normal.        Judgment: Judgment normal.       Assessment/Plan: 55 year old female with triple negative breast cancer on the left side no evidence of recurrence.  She is here also to have her port excised.  I have discussed with her in detail about the procedure.  Risks, benefits and possible occasions including but not limited to, bleeding, infection and injury to adjacent structures. Regarding breast CA, we will see her after she completes her mammo in 02/2022  Greater than 50% of the 20 minutes  visit was spent in counseling/coordination of care   Procedure Note  Dx:Hx left breast CA  Procedure: removal of right port  Anesthesia: lidocaine 1% w epi  EBL; minimal  Complications: none   After informed consent was obtained the patient's right chest and right neck was prepped and draped in the usual sterile fashion.  Local anesthetic was infiltrated and a 15 blade knife was used.  Metzenbaum scissors were used to dissect through subcutaneous tissue until identified pink of the port.  The round sutures were removed and the port was removed after asking the patient particular Valsalva maneuver.  Hemostasis was performed with pressure.  The wound was closed in 2 layer fashion with interrupted 3-0 Vicryl's and 4-0 Monocryl in a subcuticular fashion.  Dermabond was applied.  No complications  Caroleen Hamman, MD [place] Surgeon

## 2021-09-17 ENCOUNTER — Encounter: Payer: Self-pay | Admitting: General Surgery

## 2021-09-19 IMAGING — US US BREAST*L* LIMITED INC AXILLA
1 series · 11 of 11 positions shown · non-contrast
Comparison: Previous exam(s).
COMPARISON: Previous exam(s).

Addendum:
CLINICAL DATA: 53-year-old female with a palpable area of concern
in the left breast.

EXAM:
DIGITAL DIAGNOSTIC BILATERAL MAMMOGRAM WITH CAD AND TOMO
LEFT BREAST ULTRASOUND

[Series 1: us breast*left* limited inc axilla · 0.05mm/px · 11 of 11 slices shown]
[im 1/11]
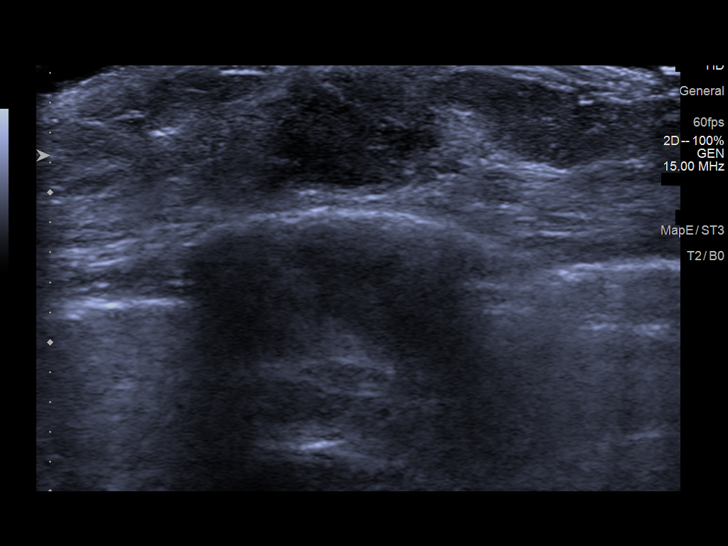
[im 2/11]
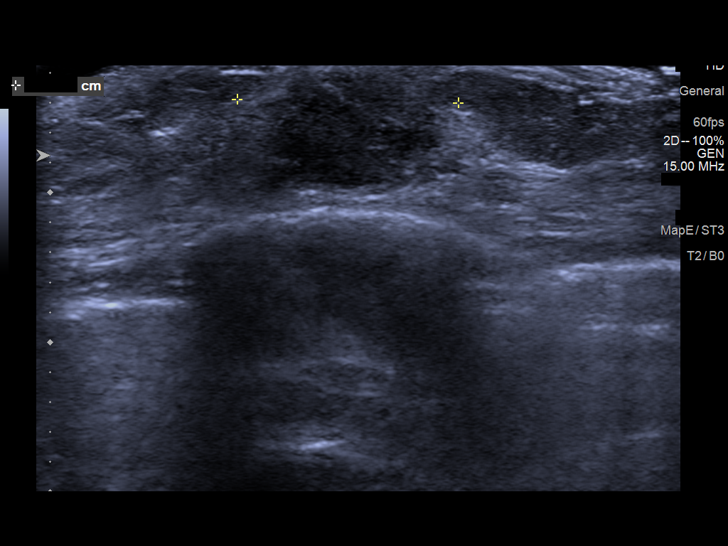
[im 3/11]
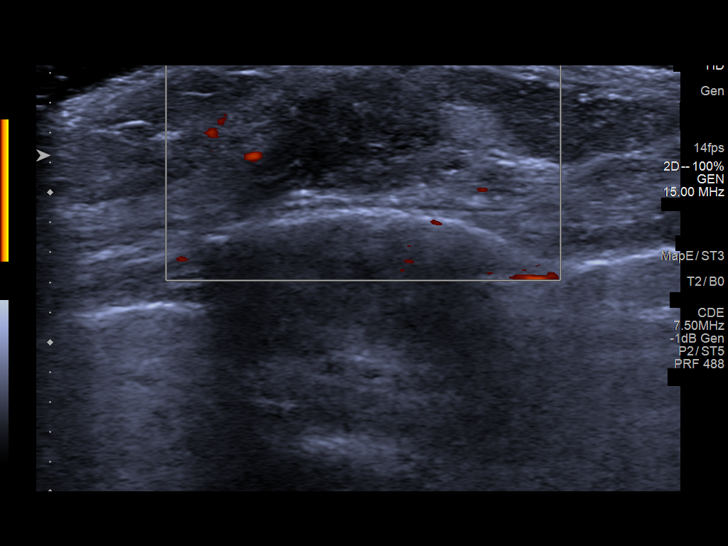
[im 4/11]
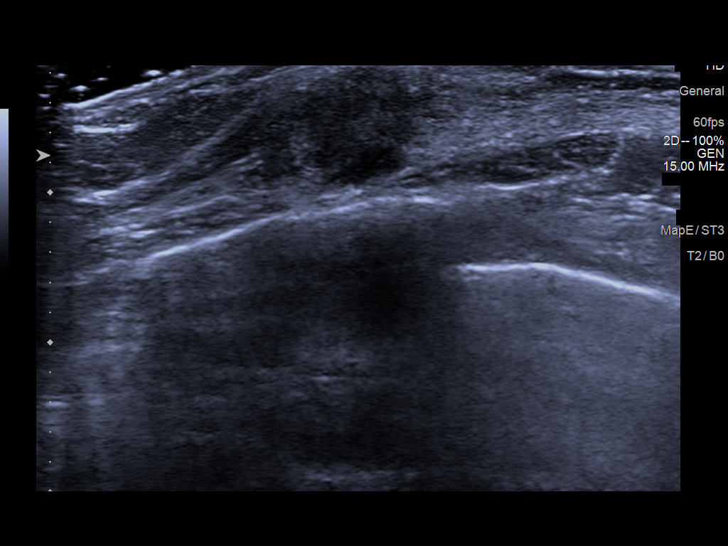
[im 5/11]
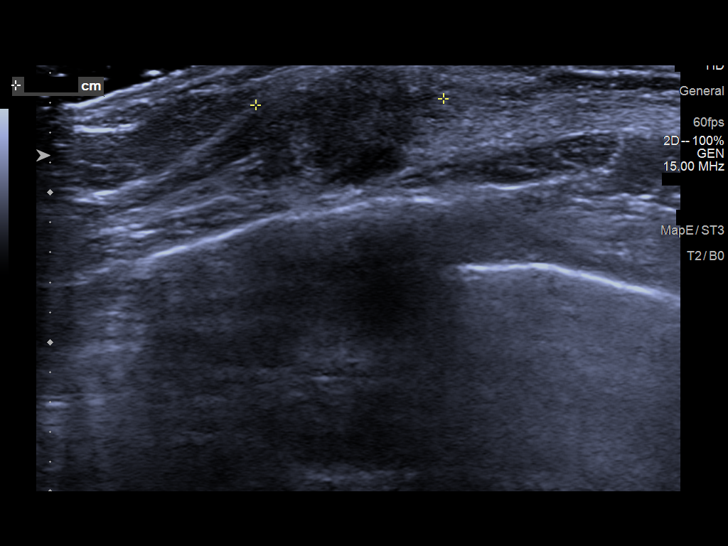
[im 6/11]
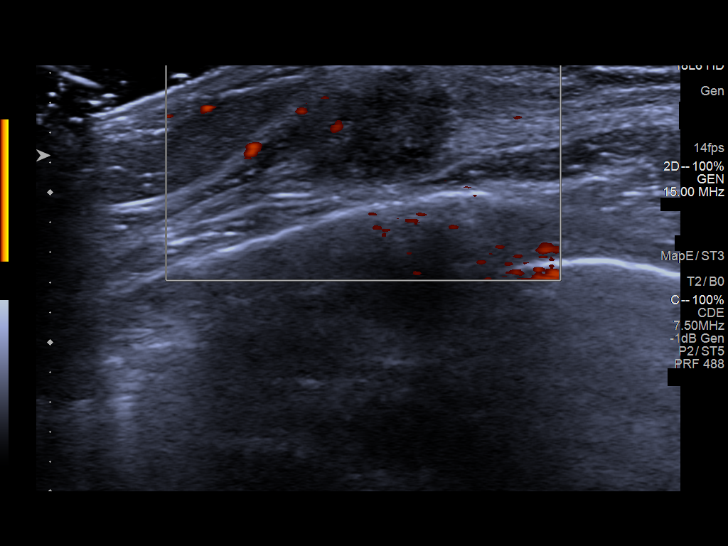
[im 7/11]
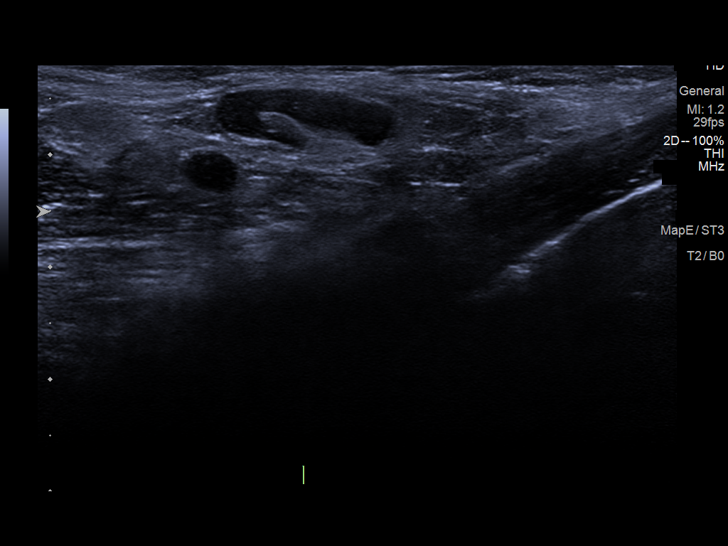
[im 8/11]
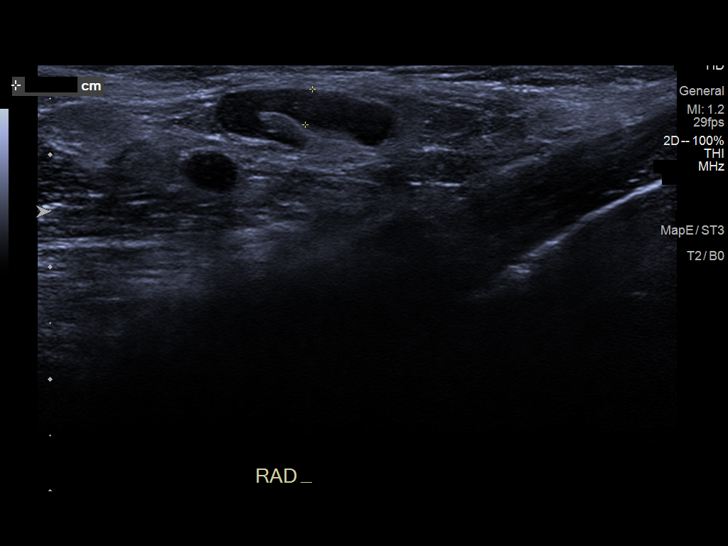
[im 9/11]
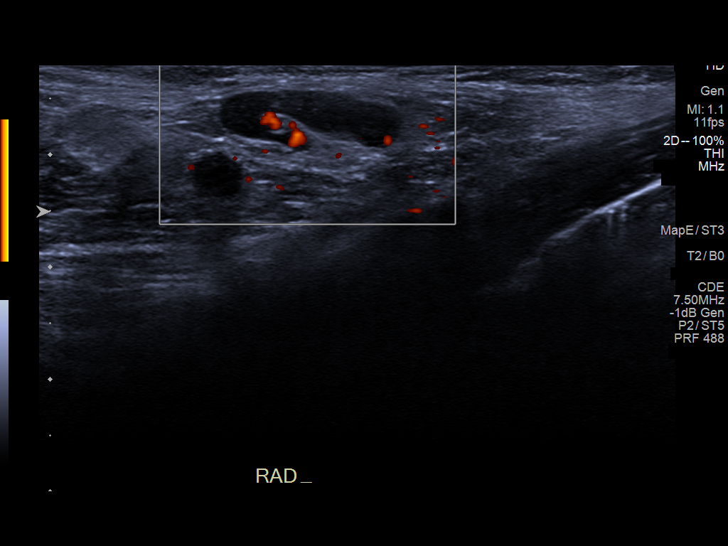
[im 10/11]
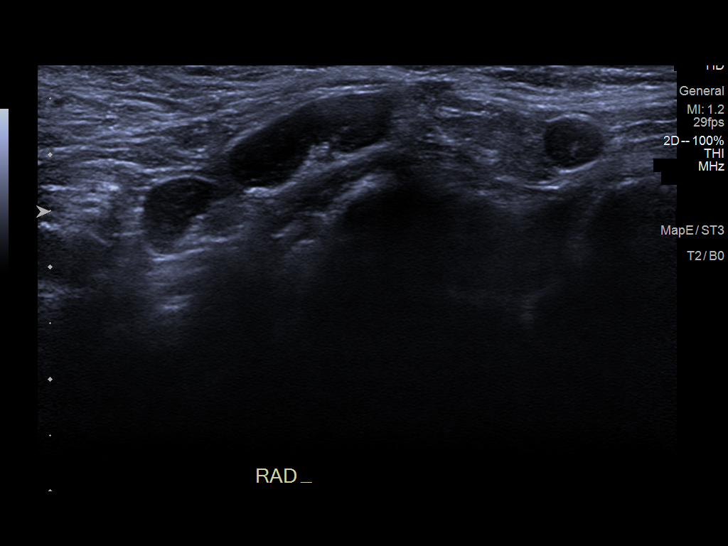
[im 11/11]
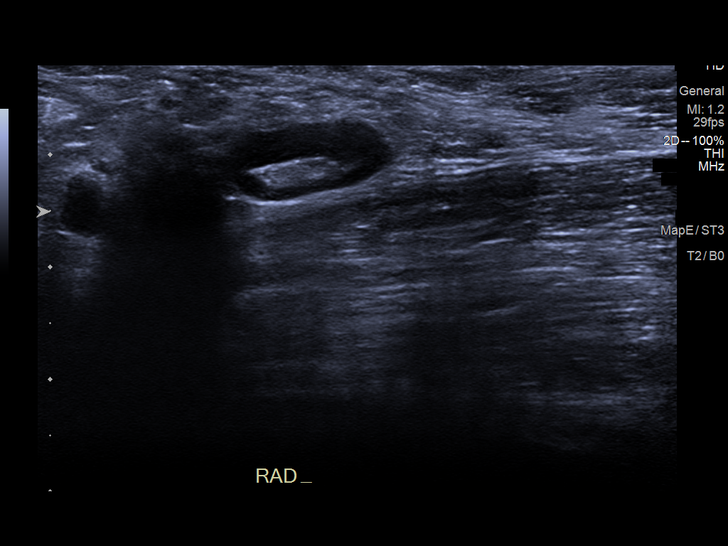

[11 of 11 positions shown; findings below may reference images not displayed]

ACR Breast Density Category d: The breast tissue is extremely dense,
which lowers the sensitivity of mammography.
FINDINGS: No suspicious masses or calcifications are seen in the right breast.
Spot compression tomograms were performed over the palpable area of
concern in the far outer left breast demonstrating a mass with
associated distortion partially visualized.

Mammographic images were processed with CAD.

Physical examination of the far outer left breast reveals a firm
mass at the approximate 3 o'clock position.

Targeted ultrasound of the outer left breast was performed. There is
an irregular hypoechoic mass at the 3 o'clock position 10 cm from
nipple measuring 1.5 x 0.9 x 1.3 cm. This corresponds well with
mammography findings. Three lymph nodes with borderline cortical
thickening are visualized in the left axilla.
IMPRESSION: 1.  Suspicious 1.5 cm palpable mass in the left breast.

2.  Mildly suspicious lymph nodes in the left axilla.

RECOMMENDATION:
1. Recommend ultrasound-guided biopsy of the palpable mass in the
outer left breast.

2. Recommend ultrasound-guided biopsy of the largest lymph node with
borderline cortical thickening in the left axilla.

I have discussed the findings and recommendations with the patient.
If applicable, a reminder letter will be sent to the patient
regarding the next appointment.

BI-RADS CATEGORY  5: Highly suggestive of malignancy.

ADDENDUM:
When the patient return for her left breast and left axillary node
biopsies on today, 01/19/2020, she asked that I not perform a biopsy
of the left axillary lymph node. She was anxious about this
procedure, and stated she had a bad experience with an axillary
procedure in the past. Therefore, only the left breast biopsy was
performed today.

*** End of Addendum ***
ACR Breast Density Category d: The breast tissue is extremely dense,
which lowers the sensitivity of mammography.
FINDINGS: No suspicious masses or calcifications are seen in the right breast.
Spot compression tomograms were performed over the palpable area of
concern in the far outer left breast demonstrating a mass with
associated distortion partially visualized.

Mammographic images were processed with CAD.

Physical examination of the far outer left breast reveals a firm
mass at the approximate 3 o'clock position.

Targeted ultrasound of the outer left breast was performed. There is
an irregular hypoechoic mass at the 3 o'clock position 10 cm from
nipple measuring 1.5 x 0.9 x 1.3 cm. This corresponds well with
mammography findings. Three lymph nodes with borderline cortical
thickening are visualized in the left axilla.
IMPRESSION: 1.  Suspicious 1.5 cm palpable mass in the left breast.

2.  Mildly suspicious lymph nodes in the left axilla.

RECOMMENDATION:
1. Recommend ultrasound-guided biopsy of the palpable mass in the
outer left breast.

2. Recommend ultrasound-guided biopsy of the largest lymph node with
borderline cortical thickening in the left axilla.

I have discussed the findings and recommendations with the patient.
If applicable, a reminder letter will be sent to the patient
regarding the next appointment.

BI-RADS CATEGORY  5: Highly suggestive of malignancy.

## 2021-12-07 ENCOUNTER — Encounter: Payer: Self-pay | Admitting: Nurse Practitioner

## 2021-12-08 ENCOUNTER — Emergency Department
Admission: EM | Admit: 2021-12-08 | Discharge: 2021-12-08 | Disposition: A | Discharge status: Home / Self Care | Payer: Medicaid Other | Attending: Emergency Medicine | Admitting: Emergency Medicine

## 2021-12-08 ENCOUNTER — Emergency Department: Payer: Medicaid Other

## 2021-12-08 ENCOUNTER — Other Ambulatory Visit: Payer: Self-pay

## 2021-12-08 DIAGNOSIS — R0789 Other chest pain: Secondary | ICD-10-CM | POA: Diagnosis present

## 2021-12-08 DIAGNOSIS — I10 Essential (primary) hypertension: Secondary | ICD-10-CM | POA: Diagnosis not present

## 2021-12-08 DIAGNOSIS — J449 Chronic obstructive pulmonary disease, unspecified: Secondary | ICD-10-CM | POA: Diagnosis not present

## 2021-12-08 DIAGNOSIS — U071 COVID-19: Secondary | ICD-10-CM | POA: Diagnosis not present

## 2021-12-08 DIAGNOSIS — F17210 Nicotine dependence, cigarettes, uncomplicated: Secondary | ICD-10-CM | POA: Insufficient documentation

## 2021-12-08 DIAGNOSIS — R079 Chest pain, unspecified: Secondary | ICD-10-CM

## 2021-12-08 DIAGNOSIS — Z853 Personal history of malignant neoplasm of breast: Secondary | ICD-10-CM | POA: Insufficient documentation

## 2021-12-08 LAB — RESP PANEL BY RT-PCR (FLU A&B, COVID) ARPGX2
Influenza A by PCR: NEGATIVE
Influenza B by PCR: NEGATIVE
SARS Coronavirus 2 by RT PCR: POSITIVE — AB

## 2021-12-08 LAB — CBC
HCT: 41.2 % (ref 36.0–46.0)
Hemoglobin: 13.6 g/dL (ref 12.0–15.0)
MCH: 32.9 pg (ref 26.0–34.0)
MCHC: 33 g/dL (ref 30.0–36.0)
MCV: 99.8 fL (ref 80.0–100.0)
Platelets: 287 10*3/uL (ref 150–400)
RBC: 4.13 MIL/uL (ref 3.87–5.11)
RDW: 13.2 % (ref 11.5–15.5)
WBC: 11.5 10*3/uL — ABNORMAL HIGH (ref 4.0–10.5)
nRBC: 0 % (ref 0.0–0.2)

## 2021-12-08 LAB — BASIC METABOLIC PANEL
Anion gap: 7 (ref 5–15)
BUN: 46 mg/dL — ABNORMAL HIGH (ref 6–20)
CO2: 22 mmol/L (ref 22–32)
Calcium: 8.7 mg/dL — ABNORMAL LOW (ref 8.9–10.3)
Chloride: 105 mmol/L (ref 98–111)
Creatinine, Ser: 1.12 mg/dL — ABNORMAL HIGH (ref 0.44–1.00)
GFR, Estimated: 58 mL/min — ABNORMAL LOW (ref >60)
Glucose, Bld: 127 mg/dL — ABNORMAL HIGH (ref 70–99)
Potassium: 4.7 mmol/L (ref 3.5–5.1)
Sodium: 134 mmol/L — ABNORMAL LOW (ref 135–145)

## 2021-12-08 LAB — TROPONIN I (HIGH SENSITIVITY)
Troponin I (High Sensitivity): 4 ng/L (ref <18)
Troponin I (High Sensitivity): 5 ng/L (ref <18)

## 2021-12-08 MED ORDER — GUAIFENESIN-CODEINE 100-10 MG/5ML PO SOLN: 5.0000 mL | Freq: Four times a day (QID) | ORAL | 0 refills | Status: DC | PRN

## 2021-12-08 MED ORDER — SODIUM CHLORIDE 0.9 % IV BOLUS
1000.0000 mL | Freq: Once | INTRAVENOUS | Status: AC
  Administered 2021-12-08: 1000 mL via INTRAVENOUS

## 2021-12-08 MED ORDER — IOHEXOL 350 MG/ML SOLN
75.0000 mL | Freq: Once | INTRAVENOUS | Status: AC | PRN
  Administered 2021-12-08: 75 mL via INTRAVENOUS

## 2021-12-08 MED ORDER — NIRMATRELVIR/RITONAVIR (PAXLOVID) TABLET (RENAL DOSING): 2.0000 | ORAL_TABLET | Freq: Two times a day (BID) | ORAL | 0 refills | Status: AC

## 2021-12-08 NOTE — ED Notes (Signed)
Pt BIB EMS from home, c/o cp and SOB started at 2000 last night. 324 ASA, 2 sprays of nitro, 12 lead SR. Pain radiates to left shoulder, started as 10/10 pain, now 8/10 after nitro. 20RAC

## 2021-12-08 NOTE — ED Triage Notes (Signed)
Pt states that she got home from work last night and had L sided chest pain- pt took a SL asa and went to sleep but when she woke up that brain was then going through to her back- pt does have a hx of breast cancer- pt feels like she cannot get a deep breath d/t pain

## 2021-12-08 NOTE — ED Provider Notes (Signed)
[facility] Emergency Department Provider Note  Time seen: 10:18 AM  I have reviewed the triage vital signs and the nursing notes.   HISTORY  Chief Complaint Chest Pain   HPI Deanna Ramsey is a 55 y.o. female with a past medical history of arthritis, COPD, hypertension, remote history of breast cancer, presents to the emergency department for left chest pain.  According to the patient over the past 2 days she has had a worsening cough congestion now experiencing pain to the left chest that started yesterday and has worsened today.  Patient states the pain feels like a sharp pain to the left chest worse when deep inspiration and seems to radiate to her back.  Patient denies any fever.  Does feel mild shortness of breath because of pain when taking a deep inspiration per patient.  No history of blood clots previously.  No anticoagulation.  No estrogen/hormonal treatments.   Past Medical History:  Diagnosis Date   Arthritis    Breast cancer ([place]) 01/2020   left breast triple neagative IMC, HG DCIS   Cellulitis    COPD (chronic obstructive pulmonary disease) (HCC)    Depression    History of kidney stones    History of tracheostomy 2000   stated that epiglottis swelled up for an unknown reason; had trach for about a week.    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   Pneumonia     Patient Active Problem List   Diagnosis Date Noted   Goals of care, counseling/discussion 03/06/2020   Carcinoma of upper-inner quadrant of left female breast ([place]) 01/22/2020   Cervical lymph node abscess 07/02/2018   Protein-calorie malnutrition, severe 07/02/2018   Sepsis ([place]) 02/17/2018   Cellulitis 04/15/2015   Kidney stone 04/12/2013    Past Surgical History:  Procedure Laterality Date   arm surgery  1984   fractured ulna   BREAST BIOPSY Left 01/2020    [place] 3:00 10cmfn [place] bx   BREAST LUMPECTOMY Left 02/04/2020   triple negative, IMC and HG DCIS with positive margins,  6 LN's negative    BREAST LUMPECTOMY Left 02/24/2020   re excision for margins   BREAST LUMPECTOMY WITH SENTINEL LYMPH NODE BIOPSY Left 02/04/2020   Procedure: BREAST LUMPECTOMY WITH SENTINEL LYMPH NODE BX;  Surgeon: Jules Husbands, MD;  Location: [facility] ORS;  Service: General;  Laterality: Left;   CLEFT LIP REPAIR     FRACTURE SURGERY     kidney stones     lymphnode neck     PORTACATH PLACEMENT N/A 02/04/2020   Procedure: INSERTION PORT-A-CATH;  Surgeon: Jules Husbands, MD;  Location: [facility] ORS;  Service: General;  Laterality: N/A;   RE-EXCISION OF BREAST LUMPECTOMY Left 02/24/2020   Procedure: RE-EXCISION OF Left BREAST LUMPECTOMY;  Surgeon: Jules Husbands, MD;  Location: [facility] ORS;  Service: General;  Laterality: Left;   TYMPANOSTOMY TUBE PLACEMENT      Prior to Admission medications   Medication Sig Start Date End Date Taking? Authorizing Provider  acetaminophen (TYLENOL) 325 MG tablet Take 650 mg by mouth every 6 (six) hours as needed for moderate pain.    [provider]  ondansetron (ZOFRAN) 4 MG tablet Take 4 mg by mouth every 8 (eight) hours as needed for nausea or vomiting.    [provider]  prochlorperazine (COMPAZINE) 10  MG tablet Take 1 tablet (10 mg total) by mouth every 6 (six) hours as needed (Nausea or vomiting). 07/24/21   Lloyd Huger, MD    Allergies  Allergen Reactions   Sulfa Antibiotics Nausea And Vomiting    Family History  Problem Relation Age of Onset   COPD Other    COPD Mother    Hypertension Mother    COPD Father    Breast cancer Neg Hx     Social History Social History   Tobacco Use   Smoking status: Every Day    Packs/day: 1.00    Types: Cigarettes   Smokeless tobacco: Never  Vaping Use   Vaping Use: Never used  Substance Use Topics   Alcohol use: No   Drug use: No    Review of  Systems Constitutional: Negative for fever. Cardiovascular: Sharp left chest pain radiating to the back Respiratory: Negative for shortness of breath. Gastrointestinal: Negative for abdominal pain Musculoskeletal: Negative for musculoskeletal complaints Neurological: Negative for headache All other ROS negative  ____________________________________________   PHYSICAL EXAM:  VITAL SIGNS: ED Triage Vitals  Enc Vitals Group     BP 12/08/21 0714 (!) 80/51     Pulse Rate 12/08/21 0714 80     Resp 12/08/21 0714 18     Temp 12/08/21 0714 99.1 F (37.3 C)     Temp Source 12/08/21 0714 Oral     SpO2 12/08/21 0714 93 %     Weight 12/08/21 0715 102 lb (46.3 kg)     Height 12/08/21 0715 5\' 5"  (1.651 m)     Head Circumference --      Peak Flow --      Pain Score 12/08/21 0714 8     Pain Loc --      Pain Edu? --      Excl. in [place]? --    Constitutional: Alert and oriented. Well appearing and in no distress. Eyes: Normal exam ENT      Head: Normocephalic and atraumatic.      Mouth/Throat: Mucous membranes are moist. Cardiovascular: Normal rate, regular rhythm.  Respiratory: Normal respiratory effort without tachypnea nor retractions. Breath sounds are clear.   Gastrointestinal: Soft and nontender. No distention.   Musculoskeletal: Nontender with normal range of motion in all extremities.  Neurologic:  Normal speech and language. No gross focal neurologic deficits Skin:  Skin is warm, dry and intact.  Psychiatric: Mood and affect are normal.   ____________________________________________    EKG  EKG viewed and interpreted by myself shows a normal sinus rhythm 88 bpm with a narrow QRS, normal axis, normal intervals, no concerning ST changes.  ____________________________________________    RADIOLOGY  Chest x-ray is negative for acute abnormality  ____________________________________________   INITIAL IMPRESSION / ASSESSMENT AND PLAN / ED COURSE  Pertinent labs & imaging  results that were available during my care of the patient were reviewed by me and considered in my medical decision making (see chart for details).   Patient presents to the emergency department for left-sided chest pain radiating to her back, described as moderate sharp pain worse with deep inspiration.  History of breast cancer status postchemotherapy now in remission per patient.  Overall patient appears well, reassuring physical exam.  Patient initially hypotensive upon arrival however patient received nitroglycerin for her chest pain prior to arrival.  We will obtain the CTA scan of the chest.  Patient has received 1 L of normal saline.  Initial troponin negative as the patient's chest  pain started yesterday this is reassuring to rule out ACS.  EKG reassuring.  Patient agreeable to plan of care for CTA of the chest.  CTA of the chest is negative.  Patient's labs have resulted showing she is COVID-positive likely explaining her increased cough and likely pleurisy causing chest pain.  We will prescribe a codeine-based cough medication, Paxlovid, discussed supportive care at home and PCP follow-up as well as return precautions.  Deanna Ramsey was evaluated in Emergency Department on 12/08/2021 for the symptoms described in the history of present illness. She was evaluated in the context of the global COVID-19 pandemic, which necessitated consideration that the patient might be at risk for infection with the SARS-CoV-2 virus that causes COVID-19. Institutional protocols and algorithms that pertain to the evaluation of patients at risk for COVID-19 are in a state of rapid change based on information released by regulatory bodies including the CDC and federal and state organizations. These policies and algorithms were followed during the patient's care in the ED.  ____________________________________________   FINAL CLINICAL IMPRESSION(S) / ED DIAGNOSES  Left-sided chest pain COVID-19   Harvest Dark, MD 12/08/21 1225

## 2021-12-19 ENCOUNTER — Other Ambulatory Visit: Payer: Self-pay

## 2021-12-19 DIAGNOSIS — C50212 Malignant neoplasm of upper-inner quadrant of left female breast: Secondary | ICD-10-CM

## 2022-01-25 ENCOUNTER — Inpatient Hospital Stay: Payer: Medicaid Other | Attending: Oncology | Admitting: Nurse Practitioner

## 2022-01-25 ENCOUNTER — Encounter: Payer: Self-pay | Admitting: Nurse Practitioner

## 2022-01-25 ENCOUNTER — Inpatient Hospital Stay: Payer: Medicaid Other

## 2022-01-25 ENCOUNTER — Ambulatory Visit: Payer: Medicaid Other | Admitting: Oncology

## 2022-01-25 ENCOUNTER — Ambulatory Visit
Admission: RE | Admit: 2022-01-25 | Discharge: 2022-01-25 | Disposition: A | Discharge status: Home / Self Care | Payer: Medicaid Other | Source: Ambulatory Visit | Attending: Nurse Practitioner | Admitting: Nurse Practitioner

## 2022-01-25 ENCOUNTER — Other Ambulatory Visit: Payer: Medicaid Other

## 2022-01-25 ENCOUNTER — Other Ambulatory Visit: Payer: Self-pay

## 2022-01-25 ENCOUNTER — Ambulatory Visit
Admission: RE | Admit: 2022-01-25 | Discharge: 2022-01-25 | Disposition: A | Discharge status: Home / Self Care | Payer: Medicaid Other | Attending: Nurse Practitioner | Admitting: Nurse Practitioner

## 2022-01-25 VITALS — BP 118/79 | HR 67 | Temp 97.9°F | Resp 18 | Wt 100.5 lb

## 2022-01-25 DIAGNOSIS — F17210 Nicotine dependence, cigarettes, uncomplicated: Secondary | ICD-10-CM | POA: Diagnosis not present

## 2022-01-25 DIAGNOSIS — C50212 Malignant neoplasm of upper-inner quadrant of left female breast: Secondary | ICD-10-CM | POA: Diagnosis not present

## 2022-01-25 DIAGNOSIS — Z923 Personal history of irradiation: Secondary | ICD-10-CM | POA: Insufficient documentation

## 2022-01-25 DIAGNOSIS — Z171 Estrogen receptor negative status [ER-]: Secondary | ICD-10-CM | POA: Diagnosis present

## 2022-01-25 DIAGNOSIS — R053 Chronic cough: Secondary | ICD-10-CM | POA: Diagnosis not present

## 2022-01-25 DIAGNOSIS — Z9221 Personal history of antineoplastic chemotherapy: Secondary | ICD-10-CM | POA: Diagnosis not present

## 2022-01-25 DIAGNOSIS — Z8616 Personal history of COVID-19: Secondary | ICD-10-CM | POA: Insufficient documentation

## 2022-01-25 DIAGNOSIS — J449 Chronic obstructive pulmonary disease, unspecified: Secondary | ICD-10-CM | POA: Diagnosis not present

## 2022-01-25 NOTE — Progress Notes (Signed)
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  INTERVAL HISTORY: Patient returns to clinic for routine 3 month evaluation. She complains of ongoing to worsening cough with left sided pain. She was diagnosed with covid in late December. Has known history of COPD. Has never seen pulmonology. She has shortness of breath when coughing. No breast complaints. No neurologic complaints. She has a good appetite and denies weight loss. No hemoptysis. No nausea, vomiting, constipation, or diarrhea. No urinary complaints. No further specific complaints today.   REVIEW OF SYSTEMS:   Review of Systems  Constitutional: Negative.  Negative for fever, malaise/fatigue and weight loss.  HENT:  Negative for congestion.   Respiratory: Negative.  Negative for cough, hemoptysis and shortness of breath.   Cardiovascular: Negative.  Negative for chest pain and leg swelling.  Gastrointestinal: Negative.  Negative for abdominal pain, diarrhea and nausea.  Genitourinary: Negative.  Negative for dysuria.  Musculoskeletal: Negative.  Negative for back pain.  Skin: Negative.  Negative for rash.  Neurological: Negative.  Negative for dizziness, focal weakness, weakness and headaches.  Psychiatric/Behavioral: Negative.  The patient is not nervous/anxious and does not have insomnia.   As per HPI.  Otherwise, a complete review of systems is negative.  PAST MEDICAL HISTORY: Past Medical History:  Diagnosis Date   Arthritis    Breast cancer ([place]) 01/2020   left breast triple neagative IMC, HG DCIS   Cellulitis    COPD (chronic obstructive pulmonary disease) (HCC)    Depression    History of kidney stones    History of tracheostomy 2000   stated that epiglottis swelled up for an unknown reason; had trach for about a week.    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   Pneumonia     PAST SURGICAL HISTORY: Past Surgical History:  Procedure Laterality Date   arm surgery  1984   fractured ulna   BREAST BIOPSY Left 01/2020   [place] 3:00 10cmfn [place] bx   BREAST LUMPECTOMY Left 02/04/2020   triple negative, IMC and HG DCIS with positive margins,  6 LN's negative    BREAST LUMPECTOMY Left 02/24/2020   re excision for margins   BREAST LUMPECTOMY WITH SENTINEL LYMPH NODE BIOPSY Left 02/04/2020   Procedure: BREAST LUMPECTOMY WITH SENTINEL LYMPH NODE BX;  Surgeon: Jules Husbands, MD;  Location: [facility] ORS;  Service: General;  Laterality: Left;   CLEFT LIP REPAIR     FRACTURE SURGERY     kidney stones     lymphnode neck     PORTACATH PLACEMENT N/A 02/04/2020   Procedure: INSERTION PORT-A-CATH;  Surgeon: Jules Husbands, MD;  Location: [facility] ORS;  Service: General;  Laterality: N/A;   RE-EXCISION OF BREAST LUMPECTOMY Left 02/24/2020   Procedure: RE-EXCISION OF Left BREAST LUMPECTOMY;  Surgeon: Jules Husbands, MD;  Location: [facility] ORS;  Service: General;  Laterality: Left;   TYMPANOSTOMY TUBE PLACEMENT      FAMILY HISTORY: Family History  Problem Relation Age of Onset   COPD Other    COPD Mother    Hypertension Mother    COPD Father    Breast cancer Neg Hx     ADVANCED DIRECTIVES (Y/N):  N  HEALTH MAINTENANCE: Social History   Tobacco Use   Smoking status: Every Day     Packs/day: 1.00    Types: Cigarettes   Smokeless tobacco: Never  Vaping Use   Vaping Use: Never used  Substance Use Topics   Alcohol use: No   Drug use: No     Colonoscopy:  PAP:  Bone density:  Lipid panel:  Allergies  Allergen Reactions   Sulfa Antibiotics Nausea And Vomiting    Current Outpatient Medications  Medication Sig Dispense Refill   acetaminophen (TYLENOL) 325 MG tablet Take 650 mg by mouth every 6 (six) hours as needed for moderate pain.     prochlorperazine (COMPAZINE) 10 MG tablet Take 1 tablet (10 mg total) by mouth every 6 (six) hours as needed (Nausea or vomiting). 60 tablet 2   ondansetron (ZOFRAN) 4 MG tablet Take 4 mg by mouth every 8 (eight) hours as needed for nausea or vomiting. (Patient not taking: Reported on 01/25/2022)     No current facility-administered medications for this visit.   Facility-Administered Medications Ordered in Other Visits  Medication Dose Route Frequency Provider Last Rate Last Admin   sodium chloride flush (NS) 0.9 % injection 10 mL  10 mL Intravenous PRN Lloyd Huger, MD   10 mL at 06/01/20 0905   sodium chloride flush (NS) 0.9 % injection 10 mL  10 mL Intravenous PRN Lloyd Huger, MD   10 mL at 08/24/20 0900    OBJECTIVE: Vitals:   01/25/22 1026  BP: 118/79  Pulse: 67  Resp: 18  Temp: 97.9 F (36.6 C)  SpO2: 98%     Body mass index is 16.72 kg/m.    ECOG FS:0 - Asymptomatic  General: Well-developed, well-nourished, no acute distress. Eyes: Pink conjunctiva, anicteric sclera. Lungs: Clear to auscultation bilaterally.  No audible wheezing or coughing Heart: Regular rate and rhythm.  Abdomen: Soft, nontender, nondistended.  Musculoskeletal: No edema, cyanosis, or clubbing. Neuro: Alert, answering all questions appropriately. Cranial nerves grossly intact. Skin: No rashes or petechiae noted. Psych: Normal affect. Breast exam: deferred  LAB RESULTS:  Lab Results  Component Value Date   NA 134  (L) 12/08/2021   K 4.7 12/08/2021   CL 105 12/08/2021   CO2 22 12/08/2021   GLUCOSE 127 (H) 12/08/2021   BUN 46 (H) 12/08/2021   CREATININE 1.12 (H) 12/08/2021   CALCIUM 8.7 (L) 12/08/2021   PROT 7.2 07/23/2021   ALBUMIN 4.0 07/23/2021   AST 19 07/23/2021   ALT 12 07/23/2021   ALKPHOS 80 07/23/2021   BILITOT 0.9 07/23/2021   GFRNONAA 58 (L) 12/08/2021   GFRAA >60 08/24/2020    Lab Results  Component Value Date   WBC 11.5 (H) 12/08/2021   NEUTROABS 4.5 07/23/2021   HGB 13.6 12/08/2021   HCT 41.2 12/08/2021   MCV 99.8 12/08/2021   PLT 287 12/08/2021     STUDIES: No results found.  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  PLAN:    1.  Pathologic stage IB triple negative invasive carcinoma of the upper  inner quadrant of left breast: Patient's initial lumpectomy was on February 04, 2020. Because of positive margins she underwent reexcision on February 24, 2020 with clear margins.  She has now had a port placed as well.  MUGA scan from March 22, 2020 reported an EF of 72%. Given the triple negative status of her disease, she will receive adjuvant chemotherapy using Adriamycin and Cytoxan with Udenyca support followed by weekly Taxol x12.  She completed her chemotherapy on August 24, 2020.  Patient has now completed adjuvant XRT.  An aromatase inhibitor would not offer any benefit given the triple negative status of her disease.  Clinically, NED today. Previous mammogram recommended MRI due to location of her malignancy. Patient agrees. Will order MRI today. Could consider mammogram in 6 months.   2.  Anxiety/insomnia: Continue evaluation and treatment per primary care.  3.  Migraines: Resolved. Previously seen by Dr. Melrose Nakayama.   4.  Depression: Resolved. Patient declined initiation of antidepressant medication or referral for counseling.  5.  Breast erythema: Secondary to XRT, resolved.  6.  Genetics: patient  previously declined appointment. Now agrees. I spoke to Faith Rogue who will reach out to patient. Referral sent today.   7. Covid 19 infection- covid 19 positive in late December 2022.   8. Port a cath- placed in March 2021. Reviewed that she will need port flushes every 8 weeks.   9. Bone health- will order bone density scan as well.   10. Chronic cough- hx of copd per patient. Question post viral cough. Chest xray today to evaluate for possible infectious etiology. Recommend referral to pulmonology for evaluation and management.   Disposition: Chest xray Referral to pulonology Referral to genetics Port flushes every 8 weeks breast mri & bone density  RTC in 3 months for port/labs and follow up with Dr. [name]  Patient expressed understanding and was in agreement with this plan. She also understands that She can call clinic at any time with any questions, concerns, or complaints.    Cancer Staging  Carcinoma of upper-inner quadrant of left female breast [facility]) Staging form: Breast, AJCC 8th Edition - Clinical stage from 02/01/2020: Stage IB (cT1c, cN0, cM0, G3, ER-, PR-, HER2-) - Signed by Lloyd Huger, MD on 02/01/2020 Stage prefix: Initial diagnosis Histologic grading system: 3 grade system   Verlon Au, NP   01/25/2022

## 2022-01-25 NOTE — Progress Notes (Signed)
Pt reports lump in right lower abdomen x 1.5 months. Denies pain to area. Pt requesting CXR d/t continued cough lasting 34month. Covid dx on 12/08/2021.

## 2022-01-26 LAB — CANCER ANTIGEN 27.29: CA 27.29: 39 U/mL — ABNORMAL HIGH (ref 0.0–38.6)

## 2022-02-01 ENCOUNTER — Telehealth: Payer: Self-pay | Admitting: *Deleted

## 2022-02-01 NOTE — Telephone Encounter (Signed)
Called pt with information from [facility].

## 2022-02-01 NOTE — Telephone Encounter (Signed)
Patient called asking about the MRI that was to be ordered and cancelling the mammogram per discussion with Alease Medina, NP at  her visit. She is also asking about the [facility] referral as she has not heard back from anyone about it. Please return her call regarding these appointments  Follow-up and Disposition History  01/25/2022 1131 - Tiffany S Easter, RN  Check-out note: Portflush every 8 weeks  Chest Xray  Referral to Pulmonology  Referral to Genetics  Breast MRI/Bone Density in 2-3 weeks  RTC 22mo portlabs/md  tse

## 2022-02-01 NOTE — Telephone Encounter (Signed)
I will call [place] to follow up on referral

## 2022-02-07 ENCOUNTER — Other Ambulatory Visit: Payer: Medicaid Other

## 2022-02-13 ENCOUNTER — Ambulatory Visit: Payer: Medicaid Other | Admitting: Surgery

## 2022-02-21 ENCOUNTER — Encounter: Payer: Medicaid Other | Admitting: Licensed Clinical Social Worker

## 2022-02-21 ENCOUNTER — Other Ambulatory Visit: Payer: Medicaid Other

## 2022-02-21 ENCOUNTER — Ambulatory Visit
Admission: RE | Admit: 2022-02-21 | Discharge: 2022-02-21 | Disposition: A | Discharge status: Home / Self Care | Payer: Medicaid Other | Source: Ambulatory Visit | Attending: Surgery | Admitting: Surgery

## 2022-02-21 DIAGNOSIS — C50212 Malignant neoplasm of upper-inner quadrant of left female breast: Secondary | ICD-10-CM | POA: Diagnosis not present

## 2022-02-27 ENCOUNTER — Encounter: Payer: Self-pay | Admitting: Surgery

## 2022-02-27 ENCOUNTER — Ambulatory Visit (INDEPENDENT_AMBULATORY_CARE_PROVIDER_SITE_OTHER): Payer: Medicaid Other | Admitting: Surgery

## 2022-02-27 ENCOUNTER — Other Ambulatory Visit: Payer: Self-pay

## 2022-02-27 VITALS — BP 120/86 | HR 109 | Temp 99.1°F | Ht 65.0 in | Wt 101.8 lb

## 2022-02-27 DIAGNOSIS — Z853 Personal history of malignant neoplasm of breast: Secondary | ICD-10-CM

## 2022-02-27 NOTE — Patient Instructions (Addendum)
We will call you to schedule the Breast MRI.  Dr.Pabon will call you with the results.  ? ?We will call you in 6 months to schedule a breast exam.  ?

## 2022-02-28 ENCOUNTER — Other Ambulatory Visit: Payer: Self-pay

## 2022-03-01 ENCOUNTER — Encounter: Payer: Self-pay | Admitting: Surgery

## 2022-03-01 NOTE — Progress Notes (Signed)
Outpatient Surgical Follow Up ? ?03/01/2022 ? ?Deanna Ramsey is an 56 y.o. female.  ? ?Chief Complaint  ?Patient presents with  ? Follow-up  ?  mammo  ? ? ?HPI: Is a 56 year old female well-known to me with history of triple negative invasive breast cancer on the left side status post lumpectomy radiation therapy and chemotherapy.  She did have positive margins on initial lumpectomy and needed a reexcision of positive margin.  Final pathology showed no evidence of additional cancer. ?She comes in for yearly mammogram and breast exam.  Mammogram personally reviewed and there is lumpectomy changes on the left side but no discrete lesions.  Please note that the patient has significant breast dense tissue that limits the sensitivity of mammogram ? ?Past Medical History:  ?Diagnosis Date  ? Arthritis   ? Breast cancer ([place]) 01/2020  ? left breast triple neagative IMC, HG DCIS  ? Cellulitis   ? COPD (chronic obstructive pulmonary disease) ([place])   ? Depression   ? History of kidney stones   ? History of tracheostomy 2000  ? stated that epiglottis swelled up for an unknown reason; had trach for about a week.   ? Hypertension   ? Personal history of chemotherapy 2021  ? left breast ca  ? Personal history of radiation therapy 2021  ? left breast triple negative IMC HG DCIS  ? Pneumonia   ? ? ?Past Surgical History:  ?Procedure Laterality Date  ? arm surgery  1984  ? fractured ulna  ? BREAST BIOPSY Left 01/2020  ? [place] 3:00 10cmfn [place] bx  ? BREAST LUMPECTOMY Left 02/04/2020  ? triple negative, IMC and HG DCIS with positive margins,  6 LN's negative   ? BREAST LUMPECTOMY Left 02/24/2020  ? re excision for margins  ? BREAST LUMPECTOMY WITH SENTINEL LYMPH NODE BIOPSY Left 02/04/2020  ? Procedure: BREAST LUMPECTOMY WITH SENTINEL LYMPH NODE BX;  Surgeon: Jules Husbands, MD;  Location: [facility] ORS;  Service: General;  Laterality: Left;  ? CLEFT LIP REPAIR    ? FRACTURE SURGERY    ? kidney stones    ? lymphnode neck    ? PORTACATH PLACEMENT  N/A 02/04/2020  ? Procedure: INSERTION PORT-A-CATH;  Surgeon: Jules Husbands, MD;  Location: [facility] ORS;  Service: General;  Laterality: N/A;  ? RE-EXCISION OF BREAST LUMPECTOMY Left 02/24/2020  ? Procedure: RE-EXCISION OF Left BREAST LUMPECTOMY;  Surgeon: Jules Husbands, MD;  Location: [facility] ORS;  Service: General;  Laterality: Left;  ? TYMPANOSTOMY TUBE PLACEMENT    ? ? ?Family History  ?Problem Relation Age of Onset  ? COPD Other   ? COPD Mother   ? Hypertension Mother   ? COPD Father   ? Breast cancer Neg Hx   ? ? ?Social History:  reports that she has been smoking cigarettes. She has been smoking an average of 1 pack per day. She has never used smokeless tobacco. She reports that she does not drink alcohol and does not use drugs. ? ?Allergies:  ?Allergies  ?Allergen Reactions  ? Sulfa Antibiotics Nausea And Vomiting  ? ? ?Medications reviewed. ? ? ? ?ROS ?Full ROS performed and is otherwise negative other than what is stated in HPI ? ? ?BP 120/86   Pulse (!) 109   Temp 99.1 ?F (37.3 ?C) (Oral)   Ht '5\' 5"'$  (1.651 m)   Wt 101 lb 12.8 oz (46.2 kg)   LMP 02/28/2012   SpO2 96%   BMI 16.94 kg/m?  ? ?  Physical Exam ?Physical Exam ?Vitals and nursing note reviewed. Exam conducted with a chaperone present.  ?Constitutional:   ?   General: She is not in acute distress. ?   Appearance: Normal appearance. She is normal weight.  ?Eyes:  ?   General: No scleral icterus.    ?   Right eye: No discharge.     ?   Left eye: No discharge.  ?Pulmonary:  ?   Effort: Pulmonary effort is normal.  ?   Breath sounds: No stridor.  ?   Comments: BREAST< prior left lumpectomy and SLNBx scars, no masses, no seromas. NO palpable or concerning lesions on either breast. Both axilla w/o LAD ?Abdominal:  ?   General: Abdomen is flat. There is no distension.  ?   Palpations: Abdomen is soft. There is no mass.  ?   Tenderness: There is no abdominal tenderness. There is no guarding or rebound.  ?   Hernia: No hernia is present.  ?Musculoskeletal:      ?   General: No swelling or tenderness. Normal range of motion.  ?   Cervical back: Normal range of motion and neck supple. No rigidity or tenderness.  ?Skin: ?   General: Skin is warm and dry.  ?   Capillary Refill: Capillary refill takes less than 2 seconds.  ?Neurological:  ?   General: No focal deficit present.  ?   Mental Status: She is alert and oriented to person, place, and time.  ?Psychiatric:     ?   Mood and Affect: Mood normal.     ?   Thought Content: Thought content normal.     ?   Judgment: Judgment normal.  ?  ? ? ? ?Assessment/Plan: ?56 year old female with history of triple negative cancer.  Patient has left breast dense tissue and given the changes of lumpectomy physical exam is limited.  Given the significant history of pulm negative breast cancer and the density of the breast I cannot fully exclude any suspicious lesion on mammogram.  I therefore think that performing MRIs might be a good option to further evaluate both breasts given his density.  Specifically further evaluate the left breast given lumpectomy changes and breast tissue.  Please note that I spent more than 30 minutes in this encounter including coordination of her care, placing orders personally reviewing images, counseling the patient and forming appropriate mentation ? ?Caroleen Hamman, MD FACS ?General Surgeon  ?

## 2022-03-07 ENCOUNTER — Ambulatory Visit (INDEPENDENT_AMBULATORY_CARE_PROVIDER_SITE_OTHER): Payer: Medicaid Other | Admitting: Internal Medicine

## 2022-03-07 ENCOUNTER — Encounter: Payer: Self-pay | Admitting: Internal Medicine

## 2022-03-07 ENCOUNTER — Other Ambulatory Visit
Admission: RE | Admit: 2022-03-07 | Discharge: 2022-03-07 | Disposition: A | Discharge status: Home / Self Care | Payer: Medicaid Other | Source: Ambulatory Visit | Attending: Internal Medicine | Admitting: Internal Medicine

## 2022-03-07 DIAGNOSIS — J449 Chronic obstructive pulmonary disease, unspecified: Secondary | ICD-10-CM | POA: Insufficient documentation

## 2022-03-07 DIAGNOSIS — F17210 Nicotine dependence, cigarettes, uncomplicated: Secondary | ICD-10-CM | POA: Diagnosis not present

## 2022-03-07 DIAGNOSIS — J4489 Other specified chronic obstructive pulmonary disease: Secondary | ICD-10-CM

## 2022-03-07 HISTORY — DX: Chronic obstructive pulmonary disease, unspecified: J44.9

## 2022-03-07 HISTORY — DX: Other specified chronic obstructive pulmonary disease: J44.89

## 2022-03-07 LAB — TSH: TSH: 0.85 u[IU]/mL (ref 0.350–4.500)

## 2022-03-07 LAB — CBC WITH DIFFERENTIAL/PLATELET
Abs Immature Granulocytes: 0.04 10*3/uL (ref 0.00–0.07)
Basophils Absolute: 0.1 10*3/uL (ref 0.0–0.1)
Basophils Relative: 1 %
Eosinophils Absolute: 0.4 10*3/uL (ref 0.0–0.5)
Eosinophils Relative: 4 %
HCT: 47.5 % — ABNORMAL HIGH (ref 36.0–46.0)
Hemoglobin: 15.7 g/dL — ABNORMAL HIGH (ref 12.0–15.0)
Immature Granulocytes: 0 %
Lymphocytes Relative: 18 %
Lymphs Abs: 1.7 10*3/uL (ref 0.7–4.0)
MCH: 32.2 pg (ref 26.0–34.0)
MCHC: 33.1 g/dL (ref 30.0–36.0)
MCV: 97.5 fL (ref 80.0–100.0)
Monocytes Absolute: 0.4 10*3/uL (ref 0.1–1.0)
Monocytes Relative: 4 %
Neutro Abs: 6.8 10*3/uL (ref 1.7–7.7)
Neutrophils Relative %: 73 %
Platelets: 309 10*3/uL (ref 150–400)
RBC: 4.87 MIL/uL (ref 3.87–5.11)
RDW: 13.2 % (ref 11.5–15.5)
WBC: 9.3 10*3/uL (ref 4.0–10.5)
nRBC: 0 % (ref 0.0–0.2)

## 2022-03-07 MED ORDER — BUDESONIDE-FORMOTEROL FUMARATE 80-4.5 MCG/ACT IN AERO: INHALATION_SPRAY | RESPIRATORY_TRACT | 12 refills | Status: DC

## 2022-03-07 NOTE — Progress Notes (Signed)
? ?Deanna Ramsey, female    DOB: [birth year],    MRN: [id number] ? ? ?Brief patient profile:  ?58  yowf active smoker born with cleft lip lots of URI/ear/sinus problems last surgery 1981  referred to pulmonary clinic in [facility]  03/07/2022 by Beckey Rutter NP oncology s/p RT to L side  plus chemo last rx Nov 2021 with doe esp since covid end of Dec 2022 with nl CTa  12/08/22  ? ? ? ? ?History of Present Illness  ?03/07/2022  Pulmonary/ 1st office eval/ Remas Sobel / [place]  Office no maint rx - has been on "steroid inhaler" helped some  ?Chief Complaint  ?Patient presents with  ? pulmonary consult  ?  Hx of COPD-dry cough at times prod with green sputum wheezing and sob with exertion. Recent CXR  ? Dyspnea: highly variable but says has trouble doing her job on feet all day / weak / tired sob  - walks fast still doing shopping  ?Cough: some am cough/ congestion no better on mucinex  ?Sleep: ok breathing flat bed big pillow  ?SABA use: nebulizer helped in past but no use of this or any other inhaler since  ? ?No obvious day to day or daytime variability or assoc excess/ purulent sputum or mucus plugs or hemoptysis or cp or chest tightness, subjective wheeze or overt sinus or hb symptoms.  ? ?Sleeping  without nocturnal  or early am exacerbation  of respiratory  c/o's or need for noct saba. Also denies any obvious fluctuation of symptoms with weather or environmental changes or other aggravating or alleviating factors except as outlined above  ? ?No unusual exposure hx or h/o childhood pna/ asthma or knowledge of premature birth. ? ?Current Allergies, Complete Past Medical History, Past Surgical History, Family History, and Social History were reviewed in [employer] record. ? ?ROS  The following are not active complaints unless bolded ?Hoarseness, sore throat, dysphagia, dental problems, itching, sneezing,  nasal congestion or discharge of excess mucus or purulent secretions, ear ache,   fever, chills,  sweats, unintended wt loss or wt gain, classically pleuritic or exertional cp,  orthopnea pnd or arm/hand swelling  or leg swelling, presyncope, palpitations, abdominal pain, anorexia, nausea, vomiting, diarrhea  or change in bowel habits or change in bladder habits, change in stools or change in urine, dysuria, hematuria,  rash, arthralgias, visual complaints, headache, numbness, weakness or ataxia or problems with walking or coordination,  change in mood or  memory. ?      ?   ? ?Past Medical History:  ?Diagnosis Date  ? Arthritis   ? Breast cancer ([place]) 01/2020  ? left breast triple neagative IMC, HG DCIS  ? Cellulitis   ? COPD (chronic obstructive pulmonary disease) ([place])   ? Depression   ? History of kidney stones   ? History of tracheostomy 2000  ? stated that epiglottis swelled up for an unknown reason; had trach for about a week.   ? Hypertension   ? Personal history of chemotherapy 2021  ? left breast ca  ? Personal history of radiation therapy 2021  ? left breast triple negative IMC HG DCIS  ? Pneumonia   ? ? ?Outpatient Medications Prior to Visit  ?Medication Sig Dispense Refill  ? acetaminophen (TYLENOL) 325 MG tablet Take 650 mg by mouth every 6 (six) hours as needed for moderate pain.    ? prochlorperazine (COMPAZINE) 10 MG tablet Take 1 tablet (10 mg total) by mouth every  6 (six) hours as needed (Nausea or vomiting). 60 tablet 2  ? ?Facility-Administered Medications Prior to Visit  ?Medication Dose Route Frequency Provider Last Rate Last Admin  ? sodium chloride flush (NS) 0.9 % injection 10 mL  10 mL Intravenous PRN Lloyd Huger, MD   10 mL at 06/01/20 0905  ? sodium chloride flush (NS) 0.9 % injection 10 mL  10 mL Intravenous PRN Lloyd Huger, MD   10 mL at 08/24/20 0900  ? ? ? ?Objective:  ?  ? ?BP 124/74 (BP Location: Left Arm, Cuff Size: Normal)   Pulse 97   Temp 98.3 ?F (36.8 ?C) (Temporal)   Ht '5\' 5"'$  (1.651 m)   Wt 102 lb (46.3 kg)   LMP 02/28/2012   SpO2 98%   BMI 16.97  kg/m?  ? ?SpO2: 98 % ? ?Pleasant amb wf, minimal speech impediment/ surgically repaired lip ? ?HEENT :intact ? ?NECK :  without JVD/Nodes/TM/ nl carotid upstrokes bilaterally ? ? ?LUNGS: no acc muscle use,  Min barrel  contour chest wall with bilateral  slightly decreased bs s audible wheeze and  without cough on insp or exp maneuvers and min  Hyperresonant  to  percussion bilaterally   ? ? ?CV:  RRR  no s3 or murmur or increase in P2, and no edema  ? ?ABD:  soft and nontender with pos end  insp Hoover's  in the supine position. No bruits or organomegaly appreciated, bowel sounds nl ? ?MS:   Nl gait/  ext warm without deformities, calf tenderness, cyanosis or clubbing ?No obvious joint restrictions  ? ?SKIN: warm and dry without lesions   ? ?NEURO:  alert, approp, nl sensorium with  no motor or cerebellar deficits apparent.  ?    ? ? ?I personally reviewed images and agree with radiology impression as follows:  ?CXR:   pa and lat 01/25/22  ?No active cardiopulmonary disease. ?   ?Assessment  ? ?Asthmatic bronchitis , chronic (HCC) ?Active smoker ?- 03/07/2022   Walked on RA  x  3  lap(s) =  approx 525  ft  @ fast pace, stopped due to 97 with lowest 02 sats  97%  ?-rx 03/07/2022  Trial of symb 80 2bid x one week then prn  ?Labs ordered 03/07/2022  :  allergy profile   alpha one AT phenotype   ? ?Pattern is more of an AB than copd at present so reasonable to try prn symbicort 80 Based on two studies from NEJM  378; 20 p 1865 (2018) and 380 : p2020-30 (2019) in pts with mild asthma it is reasonable to use low dose symbicort eg 80 2bid "prn" flare in this setting but I emphasized this was only shown with symbicort and takes advantage of the rapid onset of action but is not the same as "rescue therapy" but can be stopped once the acute symptoms have resolved and the need for rescue has been minimized (< 2 x weekly)   ? ?- The proper method of use, as well as anticipated side effects, of a metered-dose inhaler were discussed  and demonstrated to the patient using teach back method.  ? ?F/u in3 months with pfts in meantime  ? ?Each maintenance medication was reviewed in detail including emphasizing most importantly the difference between maintenance and prns and under what circumstances the prns are to be triggered using an action plan format where appropriate. ? ?Total time for H and P, chart review, counseling,  directly observing  portions of ambulatory 02 saturation study/ and generating customized AVS unique to this office visit / same day charting = 30 min  ?     ?  ?       ? ?Cigarette smoker ?4-5 min discussion re active cigarette smoking in addition to office E&M ? ?Ask about tobacco use:   ongoing ?Advise quitting   I took an extended  opportunity with this patient to outline the consequences of continued cigarette use  in airway disorders based on all the data we have from the multiple national lung health studies (perfomed over decades at millions of dollars in cost)  indicating that smoking cessation, not choice of inhalers or physicians, is the most important aspect of care.   ?Assess willingness:  Not committed at this point ?Assist in quit attempt:  Per PCP when ready ?  ?  ?  ? ? ? ?Christinia Gully, MD ?03/07/2022 ?    ?

## 2022-03-07 NOTE — Patient Instructions (Addendum)
Symbicort 80  Take 2 puffs first thing in am and then another 2 puffs about 12 hours later for at least a week and then ok to taper ? ?Work on inhaler technique:  relax and gently blow all the way out then take a nice smooth full deep breath back in, triggering the inhaler at same time you start breathing in.  Hold for up to 5 seconds if you can. Blow out thru nose. Rinse and gargle with water when done.  If mouth or throat bother you at all,  try brushing teeth/gums/tongue with arm and hammer toothpaste/ make a slurry and gargle and spit out.  ? ?Please remember to go to the lab department   for your tests - we will call you with the results when they are available. ?    ? ?Schedule pfts and I will call you with results  ? ?Walk today  ? ?Please schedule a follow up visit in 3 months but call sooner if needed  ? ?   ?

## 2022-03-07 NOTE — Assessment & Plan Note (Addendum)
4-5 min discussion re active cigarette smoking in addition to office E&M ? ?Ask about tobacco use:   ongoing ?Advise quitting   I took an extended  opportunity with this patient to outline the consequences of continued cigarette use  in airway disorders based on all the data we have from the multiple national lung health studies (perfomed over decades at millions of dollars in cost)  indicating that smoking cessation, not choice of inhalers or physicians, is the most important aspect of care.   ?Assess willingness:  Not committed at this point ?Assist in quit attempt:  Per PCP when ready ?  ?  ?  ? ? ?

## 2022-03-07 NOTE — Assessment & Plan Note (Addendum)
Active smoker ?- 03/07/2022   Walked on RA  x  3  lap(s) =  approx 525  ft  @ fast pace, stopped due to 97 with lowest 02 sats  97%  ?-rx 03/07/2022  Trial of symb 80 2bid x one week then prn  ?Labs ordered 03/07/2022  :  allergy profile   alpha one AT phenotype   ? ?Pattern is more of an AB than copd at present so reasonable to try prn symbicort 80 Based on two studies from NEJM  378; 20 p 1865 (2018) and 380 : p2020-30 (2019) in pts with mild asthma it is reasonable to use low dose symbicort eg 80 2bid "prn" flare in this setting but I emphasized this was only shown with symbicort and takes advantage of the rapid onset of action but is not the same as "rescue therapy" but can be stopped once the acute symptoms have resolved and the need for rescue has been minimized (< 2 x weekly)   ? ?- The proper method of use, as well as anticipated side effects, of a metered-dose inhaler were discussed and demonstrated to the patient using teach back method.  ? ?F/u in3 months with pfts in meantime  ? ?Each maintenance medication was reviewed in detail including emphasizing most importantly the difference between maintenance and prns and under what circumstances the prns are to be triggered using an action plan format where appropriate. ? ?Total time for H and P, chart review, counseling,  directly observing portions of ambulatory 02 saturation study/ and generating customized AVS unique to this office visit / same day charting = 30 min  ?     ?  ?       ?

## 2022-03-10 LAB — IGE: IgE (Immunoglobulin E), Serum: 7 IU/mL (ref 6–495)

## 2022-03-11 LAB — ALPHA-1-ANTITRYPSIN PHENOTYP: A-1 Antitrypsin, Ser: 143 mg/dL (ref 101–187)

## 2022-03-13 ENCOUNTER — Other Ambulatory Visit: Payer: Medicaid Other

## 2022-03-21 ENCOUNTER — Ambulatory Visit
Admission: RE | Admit: 2022-03-21 | Discharge: 2022-03-21 | Disposition: A | Discharge status: Home / Self Care | Payer: Medicaid Other | Source: Ambulatory Visit | Attending: Surgery | Admitting: Surgery

## 2022-03-21 DIAGNOSIS — Z853 Personal history of malignant neoplasm of breast: Secondary | ICD-10-CM | POA: Insufficient documentation

## 2022-03-21 MED ORDER — GADOBUTROL 1 MMOL/ML IV SOLN
5.0000 mL | Freq: Once | INTRAVENOUS | Status: AC | PRN
  Administered 2022-03-21: 5 mL via INTRAVENOUS

## 2022-03-22 ENCOUNTER — Telehealth: Payer: Self-pay

## 2022-03-22 NOTE — Telephone Encounter (Signed)
Notified patient as instructed, patient pleased. Discussed follow-up appointments, patient agrees  

## 2022-03-22 NOTE — Telephone Encounter (Signed)
-----   Message from Jules Husbands, MD sent at 03/22/2022 10:23 AM EDT ----- ?Please let her know MRI looks good, Nothing to be concerned about. F/U w me ?----- Message ----- ?From: Interface, Rad Results In ?Sent: 03/22/2022  10:02 AM EDT ?To: Jules Husbands, MD ? ? ?

## 2022-04-21 NOTE — Progress Notes (Deleted)
[place]  [phone]) [phone] [phone]) [phone]  ID: Deanna Ramsey OB: Jun 17, 1966  MR#: [id number]  XTK#:[id number]  Patient Care Team: Letta Median, MD as PCP - General (Family Medicine) Rico Junker, RN as Registered Nurse Theodore Demark, RN as Oncology Nurse Navigator Pabon, Marjory Lies, MD as Consulting Physician (General Surgery) Lloyd Huger, MD as Consulting Physician (Oncology) Anabel Bene, MD as Referring Physician (Neurology) Noreene Filbert, MD as Radiation Oncologist (Radiation Oncology)  I connected with Deanna Ramsey on 04/21/22 at 10:15 AM EDT by video enabled telemedicine visit and verified that I am speaking with the correct person using two identifiers.   I discussed the limitations, risks, security and privacy concerns of performing an evaluation and management service by telemedicine and the availability of in-person appointments. I also discussed with the patient that there may be a patient responsible charge related to this service. The patient expressed understanding and agreed to proceed.   Other persons participating in the visit and their role in the encounter: Patient, MD.  Patient's location: Home. Provider's location: Clinic.  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  INTERVAL HISTORY: Patient agreed to video assisted telemedicine visit for routine 18-monthevaluation.  Visit was converted to telephone only secondary to technical difficulties.  She currently feels well and is asymptomatic.  She denies any weakness or fatigue.  She has no neurologic complaints.  She has a good appetite and denies weight loss.  She has no chest pain, shortness of breath, or hemoptysis.  She denies any nausea, vomiting, constipation, or diarrhea.  She has no urinary complaints.  Patient feels at her baseline offers no specific complaints today.  REVIEW OF  SYSTEMS:   Review of Systems  Constitutional: Negative.  Negative for fever, malaise/fatigue and weight loss.  HENT:  Negative for congestion.   Respiratory: Negative.  Negative for cough, hemoptysis and shortness of breath.   Cardiovascular: Negative.  Negative for chest pain and leg swelling.  Gastrointestinal: Negative.  Negative for abdominal pain, diarrhea and nausea.  Genitourinary: Negative.  Negative for dysuria.  Musculoskeletal: Negative.  Negative for back pain.  Skin: Negative.  Negative for rash.  Neurological: Negative.  Negative for dizziness, focal weakness, weakness and headaches.  Psychiatric/Behavioral: Negative.  The patient is not nervous/anxious and does not have insomnia.    As per HPI. Otherwise, a complete review of systems is negative.  PAST MEDICAL HISTORY: Past Medical History:  Diagnosis Date   Arthritis    [place]   [place]   left breast triple neagative IMC, HG DCIS   Cellulitis    COPD (chronic obstructive pulmonary disease) (HCC)    Depression    History of kidney stones    History of tracheostomy 2000   stated that epiglottis swelled up for an unknown reason; had trach for about a week.    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   Pneumonia     PAST SURGICAL HISTORY: Past Surgical History:  Procedure Laterality Date   arm surgery  1984   fractured ulna   [place]   BREAST LUMPECTOMY Left 02/04/2020   triple negative, IMC and HG DCIS with positive margins,  6 LN's negative    BREAST LUMPECTOMY Left 02/24/2020   re excision for margins   BREAST LUMPECTOMY WITH SENTINEL LYMPH NODE BIOPSY Left 02/04/2020   Procedure: BREAST LUMPECTOMY WITH SENTINEL LYMPH NODE BX;  Surgeon: Jules Husbands, MD;  Location: [facility] ORS;  Service:  General;  Laterality: Left;   CLEFT LIP REPAIR     FRACTURE SURGERY     kidney stones     lymphnode neck     PORTACATH PLACEMENT N/A 02/04/2020   Procedure: INSERTION PORT-A-CATH;  Surgeon: Jules Husbands, MD;  Location: [facility] ORS;  Service: General;  Laterality: N/A;   RE-EXCISION OF BREAST LUMPECTOMY Left 02/24/2020   Procedure: RE-EXCISION OF Left BREAST LUMPECTOMY;  Surgeon: Jules Husbands, MD;  Location: [facility] ORS;  Service: General;  Laterality: Left;   TYMPANOSTOMY TUBE PLACEMENT      FAMILY HISTORY: Family History  Problem Relation Age of Onset   COPD Other    COPD Mother    Hypertension Mother    COPD Father    Breast cancer Neg Hx     ADVANCED DIRECTIVES (Y/N):  N  HEALTH MAINTENANCE: Social History   Tobacco Use   Smoking status: Every Day    Packs/day: 2.00    Years: 40.00    Pack years: 80.00    Types: Cigarettes   Smokeless tobacco: Never   Tobacco comments:    1PPD 03/07/2022  Vaping Use   Vaping Use: Never used  Substance Use Topics   Alcohol use: No   Drug use: No     Colonoscopy:  PAP:  Bone density:  Lipid panel:  Allergies  Allergen Reactions   Sulfa Antibiotics Nausea And Vomiting    Current Outpatient Medications  Medication Sig Dispense Refill   acetaminophen (TYLENOL) 325 MG tablet Take 650 mg by mouth every 6 (six) hours as needed for moderate pain.     budesonide-formoterol (SYMBICORT) 80-4.5 MCG/ACT inhaler Take 2 puffs first thing in am and then another 2 puffs about 12 hours later. 1 each 12   prochlorperazine (COMPAZINE) 10 MG tablet Take 1 tablet (10 mg total) by mouth every 6 (six) hours as needed (Nausea or vomiting). 60 tablet 2   No current facility-administered medications for this visit.   Facility-Administered Medications Ordered in Other Visits  Medication Dose Route Frequency Provider Last Rate Last Admin   sodium chloride flush (NS) 0.9 % injection 10 mL  10 mL Intravenous PRN Lloyd Huger, MD   10 mL at  06/01/20 0905   sodium chloride flush (NS) 0.9 % injection 10 mL  10 mL Intravenous PRN Lloyd Huger, MD   10 mL at 08/24/20 0900    OBJECTIVE: There were no vitals filed for this visit.    There is no height or weight on file to calculate BMI.    ECOG FS:0 - Asymptomatic  General: Well-developed, well-nourished, no acute distress. HEENT: Normocephalic. Neuro: Alert, answering all questions appropriately. Cranial nerves grossly intact. Psych: Normal affect.   LAB RESULTS:  Lab Results  Component Value Date   NA 134 (L) 12/08/2021   K 4.7 12/08/2021   CL 105 12/08/2021   CO2 22 12/08/2021   GLUCOSE 127 (H) 12/08/2021   BUN 46 (H) 12/08/2021   CREATININE 1.12 (H) 12/08/2021   CALCIUM 8.7 (L) 12/08/2021   PROT 7.2 07/23/2021   ALBUMIN 4.0 07/23/2021   AST 19 07/23/2021   ALT 12 07/23/2021   ALKPHOS 80 07/23/2021   BILITOT 0.9  07/23/2021   GFRNONAA 58 (L) 12/08/2021   GFRAA >60 08/24/2020    Lab Results  Component Value Date   WBC 9.3 03/07/2022   NEUTROABS 6.8 03/07/2022   HGB 15.7 (H) 03/07/2022   HCT 47.5 (H) 03/07/2022   MCV 97.5 03/07/2022   PLT 309 03/07/2022     STUDIES: No results found.  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  PLAN:    1.  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 Because of positive margins she underwent reexcision on February 24, 2020 with clear margins.  MUGA scan from March 22, 2020 reported an EF of 72%. Given the triple negative status of her disease, she will receive adjuvant chemotherapy using Adriamycin and Cytoxan with Udenyca support followed by weekly Taxol x12.  She completed her chemotherapy on August 24, 2020.  Patient has now completed adjuvant XRT.  An aromatase inhibitor would not offer any benefit given the triple negative status of  her disease.  No intervention is needed at this time.  Return to clinic in 6 months for routine evaluation.  Patient's next mammogram will be due in September 2022. 2.  Anxiety/insomnia: Patient does not complain of this today.  Continue evaluation and treatment per primary care. 3.  Migraines: Resolved.  Patient was previously given a referral to neurology for further evaluation. 4.  Depression: Resolved.  Patient declined initiation of antidepressant medication or referral for counseling. 5.  Breast erythema: Resolved. 6.  Genetics: Results pending at time of dictation.  I provided 20 minutes of face-to-face video visit time during this encounter which included chart review, counseling, and coordination of care as documented above.   Patient expressed understanding and was in agreement with this plan. She also understands that She can call clinic at any time with any questions, concerns, or complaints.    Cancer Staging  Carcinoma of upper-inner quadrant of left female breast [facility]) Staging form: Breast, AJCC 8th Edition - Clinical stage from 02/01/2020: Stage IB (cT1c, cN0, cM0, G3, ER-, PR-, HER2-) - Signed by Lloyd Huger, MD on 02/01/2020 Stage prefix: Initial diagnosis Histologic grading system: 3 grade system   Lloyd Huger, MD   04/21/2022 7:10 PM

## 2022-04-24 ENCOUNTER — Inpatient Hospital Stay: Payer: Medicaid Other

## 2022-04-24 ENCOUNTER — Inpatient Hospital Stay: Payer: Medicaid Other | Admitting: Oncology

## 2022-04-24 DIAGNOSIS — Z171 Estrogen receptor negative status [ER-]: Secondary | ICD-10-CM

## 2022-04-25 ENCOUNTER — Ambulatory Visit
Admission: RE | Admit: 2022-04-25 | Discharge: 2022-04-25 | Disposition: A | Discharge status: Home / Self Care | Payer: Medicaid Other | Source: Ambulatory Visit | Attending: Nurse Practitioner | Admitting: Nurse Practitioner

## 2022-04-25 DIAGNOSIS — C50212 Malignant neoplasm of upper-inner quadrant of left female breast: Secondary | ICD-10-CM | POA: Insufficient documentation

## 2022-04-25 DIAGNOSIS — Z171 Estrogen receptor negative status [ER-]: Secondary | ICD-10-CM | POA: Insufficient documentation

## 2022-04-25 IMAGING — MG MM DIGITAL DIAGNOSTIC UNILAT*L* W/ TOMO W/ CAD
6 series · 6 of 18 positions shown · non-contrast
Comparison: Previous exam(s).

CLINICAL DATA: Status post LEFT lumpectomy in January 2020 for
triple negative breast cancer. Patient is currently undergoing
chemotherapy. Negative margins on re-excision. Six negative lymph
nodes.

EXAM:
DIGITAL DIAGNOSTIC UNILATERAL LEFT MAMMOGRAM WITH TOMO AND CAD;
ULTRASOUND LEFT BREAST LIMITED

[L CC synth-2D]
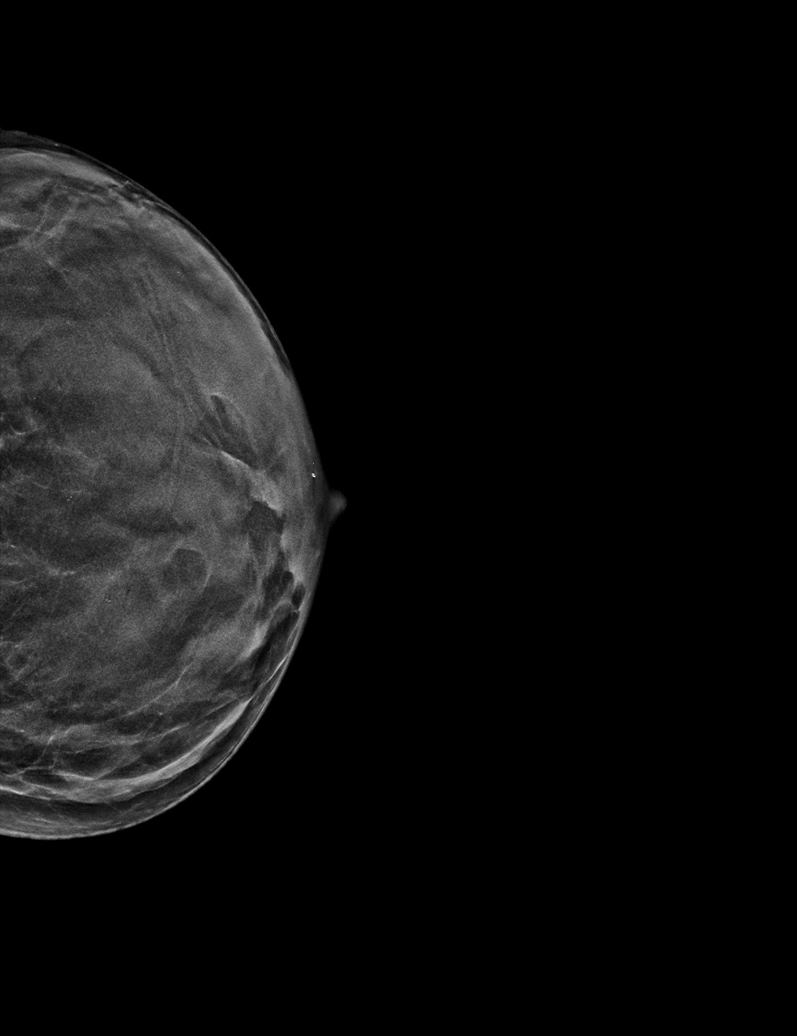

[L MLO synth-2D]
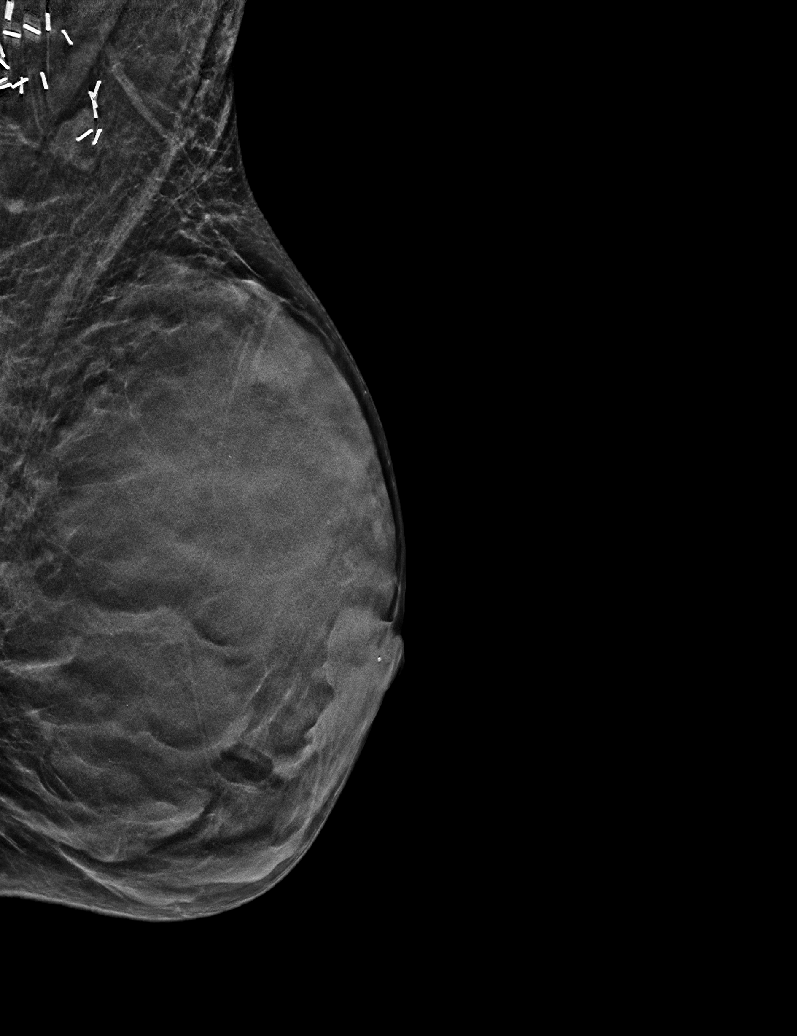

[L XCCL synth-2D]
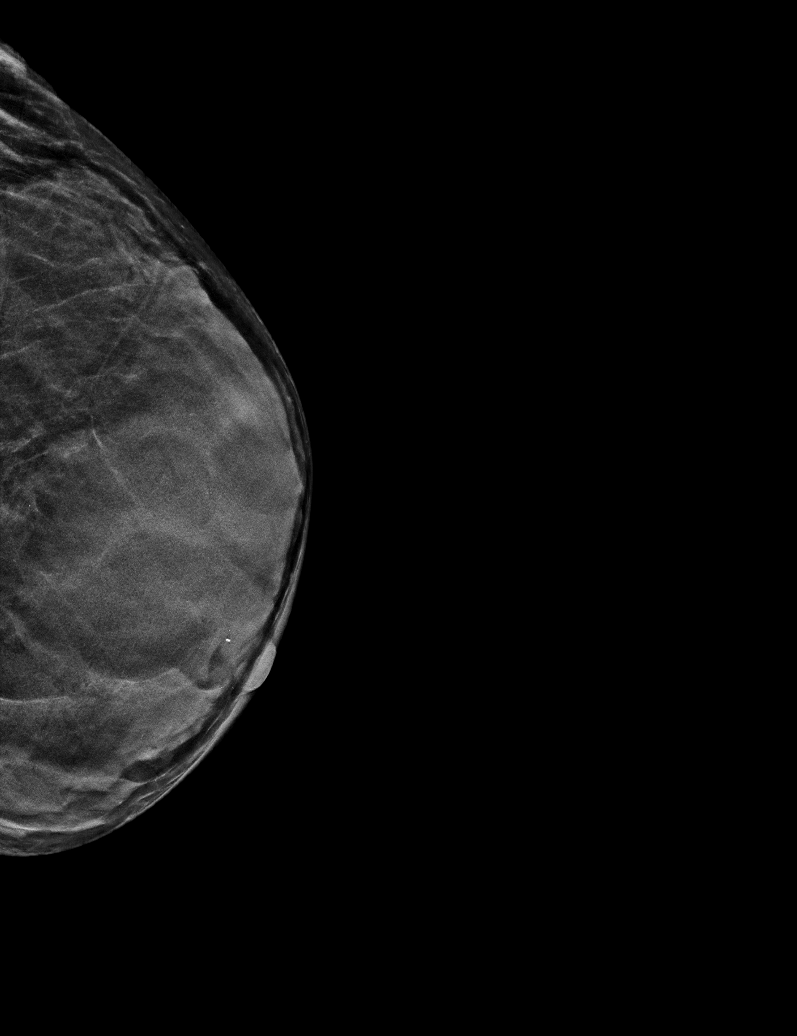

[L XCCL tomo · tomo slice 19/38.0]
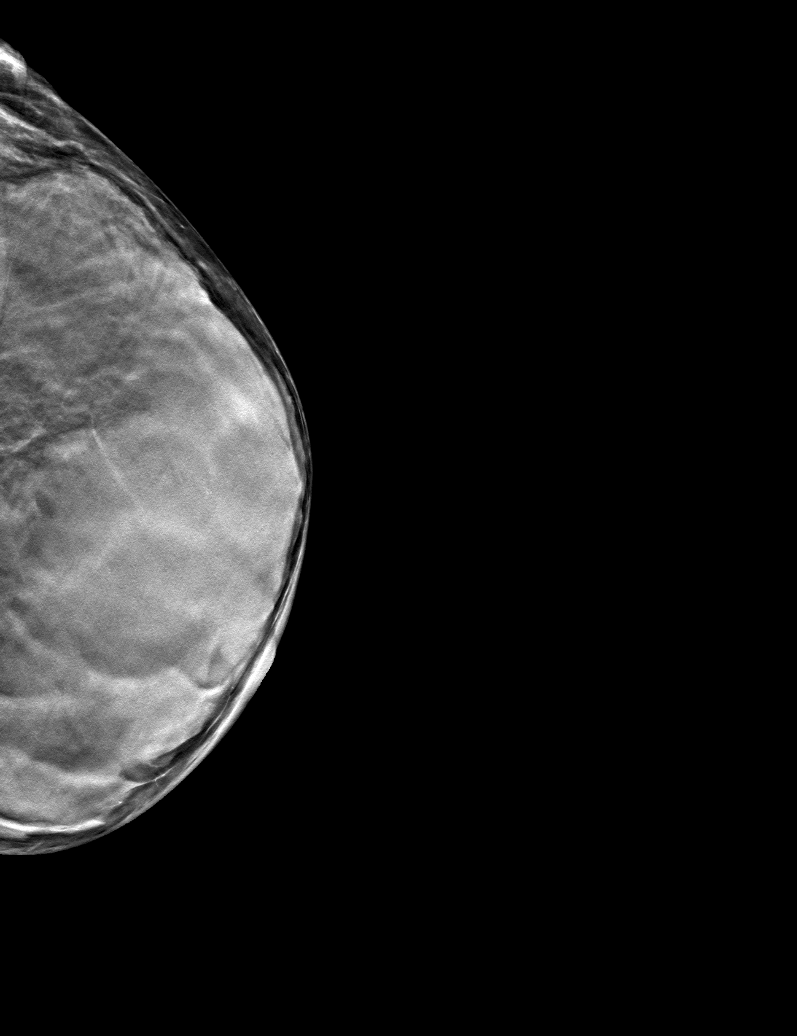

[L MLO tomo · tomo slice 19/36.0]
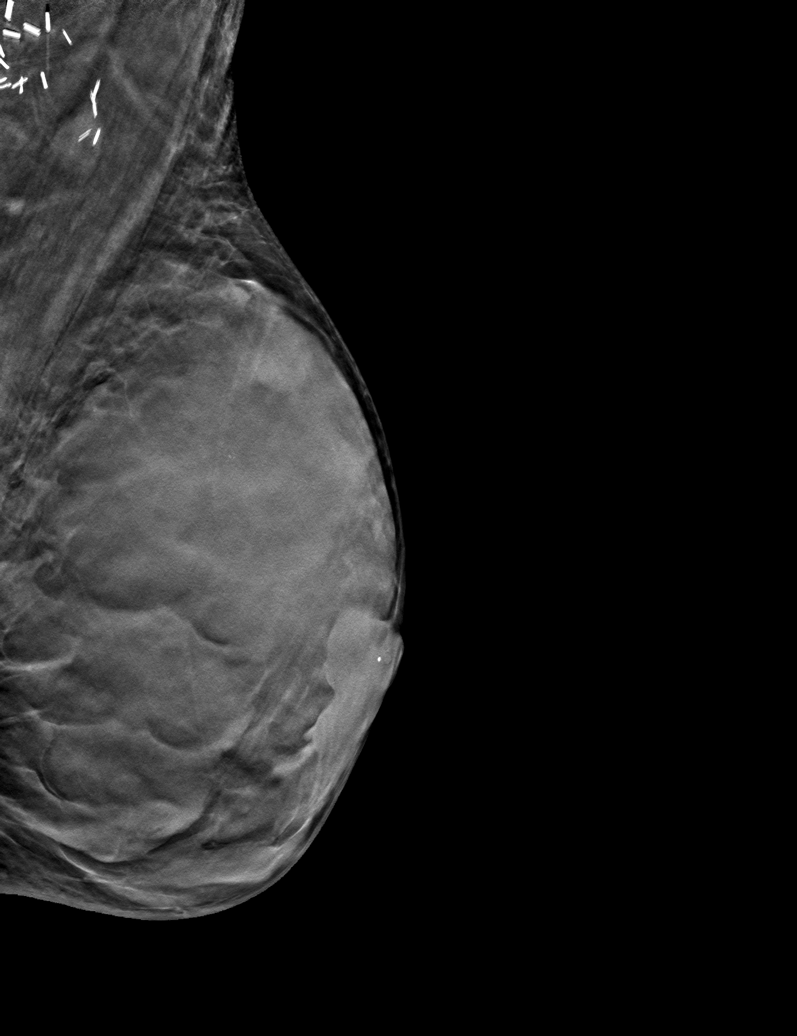

[L CC tomo · tomo slice 20/39.0]
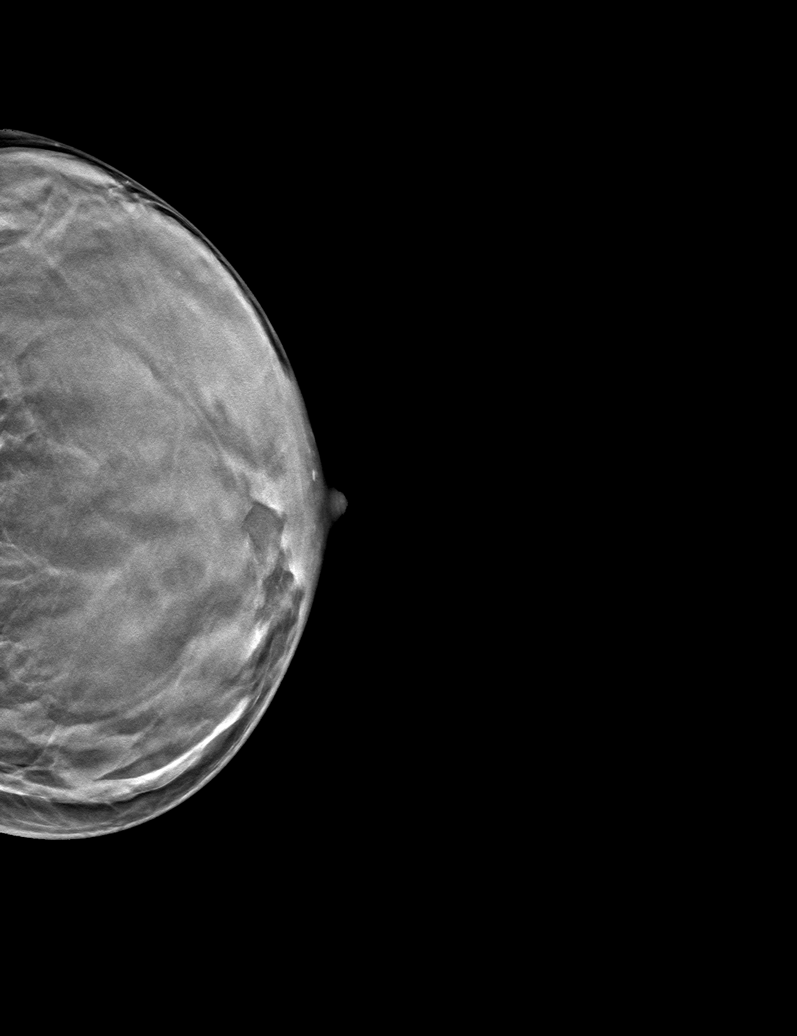

[6 of 18 positions shown; findings below may reference images not displayed]

ACR Breast Density Category d: The breast tissue is extremely dense,
which lowers the sensitivity of mammography.
FINDINGS: There are multiple surgical clips seen in the LEFT axilla. There is
a an oval mass with circumscribed margins seen in the LEFT axilla,
not previously seen on prior mammogram. This likely reflects a lymph
node. Given breast density, targeted ultrasound was performed.

Mammographic images were processed with CAD.

On physical exam, there is a well-healed surgical scar along the
lateral aspect of the breast. Targeted ultrasound was performed of
the LEFT axilla and LEFT lateral aspect of the breast. No suspicious
cystic or solid mass is seen. There is a benign-appearing lymph node
in the LEFT axilla with a smooth cortex measuring up to 2 mm in
thickness. It demonstrates a normal echogenic hilum.
IMPRESSION: No mammographic evidence of malignancy. Breast MRI with and without
contrast would be of assistance in evaluating for mammographically
occult malignancy given extreme breast density.

RECOMMENDATION:
Diagnostic mammogram is recommended in 6 months of bilateral breasts
to establish yearly lumpectomy observation. Patient is due in
Saturday January, 2021.

The American Cancer Society recommends annual MRI and mammography in
patients with an estimated lifetime risk of developing breast cancer
greater than 20 - 25%, or who are known or suspected to be positive
for the breast cancer gene.

I have discussed the findings and recommendations with the patient.
If applicable, a reminder letter will be sent to the patient
regarding the next appointment.

BI-RADS CATEGORY  2: Benign.

## 2022-05-02 ENCOUNTER — Other Ambulatory Visit: Payer: Medicaid Other

## 2022-05-02 ENCOUNTER — Ambulatory Visit: Payer: Medicaid Other | Admitting: Oncology

## 2022-05-03 NOTE — Progress Notes (Unsigned)
[place]  [phone] [phone]  ID: Deanna Ramsey OB: 1966-02-15  MR#: [id number]  JQZ#:[id number]  Patient Care Team: Letta Median, MD as PCP - General (Family Medicine) Rico Junker, RN as Registered Nurse Theodore Demark, RN as Oncology Nurse Navigator Pabon, Marjory Lies, MD as Consulting Physician (General Surgery) Lloyd Huger, MD as Consulting Physician (Oncology) Anabel Bene, MD as Referring Physician (Neurology) Noreene Filbert, MD as Radiation Oncologist (Radiation Oncology)  I connected with Deanna Ramsey on 05/03/22 at 10:45 AM EDT by video enabled telemedicine visit and verified that I am speaking with the correct person using two identifiers.   I discussed the limitations, risks, security and privacy concerns of performing an evaluation and management service by telemedicine and the availability of in-person appointments. I also discussed with the patient that there may be a patient responsible charge related to this service. The patient expressed understanding and agreed to proceed.   Other persons participating in the visit and their role in the encounter: Patient, MD.  Patient's location: Home. Provider's location: Clinic.  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  INTERVAL HISTORY: Patient agreed to video assisted telemedicine visit for routine 72-monthevaluation.  Visit was converted to telephone only secondary to technical difficulties.  She currently feels well and is asymptomatic.  She denies any weakness or fatigue.  She has no neurologic complaints.  She has a good appetite and denies weight loss.  She has no chest pain, shortness of breath, or hemoptysis.  She denies any nausea, vomiting, constipation, or diarrhea.  She has no urinary complaints.  Patient feels at her baseline offers no specific complaints today.  REVIEW OF  SYSTEMS:   Review of Systems  Constitutional: Negative.  Negative for fever, malaise/fatigue and weight loss.  HENT:  Negative for congestion.   Respiratory: Negative.  Negative for cough, hemoptysis and shortness of breath.   Cardiovascular: Negative.  Negative for chest pain and leg swelling.  Gastrointestinal: Negative.  Negative for abdominal pain, diarrhea and nausea.  Genitourinary: Negative.  Negative for dysuria.  Musculoskeletal: Negative.  Negative for back pain.  Skin: Negative.  Negative for rash.  Neurological: Negative.  Negative for dizziness, focal weakness, weakness and headaches.  Psychiatric/Behavioral: Negative.  The patient is not nervous/anxious and does not have insomnia.    As per HPI. Otherwise, a complete review of systems is negative.  PAST MEDICAL HISTORY: Past Medical History:  Diagnosis Date   Arthritis    Asthmatic [place] , [place] [place]   [place]   left breast triple neagative IMC, HG DCIS   Cellulitis    COPD (chronic obstructive pulmonary disease) (HCC)    Depression    History of kidney stones    History of tracheostomy 2000   stated that epiglottis swelled up for an unknown reason; had trach for about a week.    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   Pneumonia     PAST SURGICAL HISTORY: Past Surgical History:  Procedure Laterality Date   arm surgery  1984   fractured ulna   BRE[place]   BREAST LUMPECTOMY Left 02/04/2020   triple negative, IMC and HG DCIS with positive margins,  6 LN's negative    BREAST LUMPECTOMY Left 02/24/2020   re excision for margins   BREAST LUMPECTOMY WITH SENTINEL LYMPH NODE BIOPSY Left 02/04/2020   Procedure: BREAST LUMPECTOMY WITH SENTINEL LYMPH NODE BX;  Surgeon: Jules Husbands, MD;  Location: [facility] ORS;  Service:  General;  Laterality: Left;   CLEFT LIP REPAIR     FRACTURE SURGERY     kidney stones     lymphnode neck     PORTACATH PLACEMENT N/A 02/04/2020   Procedure: INSERTION PORT-A-CATH;  Surgeon: Jules Husbands, MD;  Location: [facility] ORS;  Service: General;  Laterality: N/A;   RE-EXCISION OF BREAST LUMPECTOMY Left 02/24/2020   Procedure: RE-EXCISION OF Left BREAST LUMPECTOMY;  Surgeon: Jules Husbands, MD;  Location: [facility] ORS;  Service: General;  Laterality: Left;   TYMPANOSTOMY TUBE PLACEMENT      FAMILY HISTORY: Family History  Problem Relation Age of Onset   COPD Other    COPD Mother    Hypertension Mother    COPD Father    Breast cancer Neg Hx     ADVANCED DIRECTIVES (Y/N):  N  HEALTH MAINTENANCE: Social History   Tobacco Use   Smoking status: Every Day    Packs/day: 2.00    Years: 40.00    Pack years: 80.00    Types: Cigarettes   Smokeless tobacco: Never   Tobacco comments:    1PPD 03/07/2022  Vaping Use   Vaping Use: Never used  Substance Use Topics   Alcohol use: No   Drug use: No     Colonoscopy:  PAP:  Bone density:  Lipid panel:  Allergies  Allergen Reactions   Sulfa Antibiotics Nausea And Vomiting    Current Outpatient Medications  Medication Sig Dispense Refill   acetaminophen (TYLENOL) 325 MG tablet Take 650 mg by mouth every 6 (six) hours as needed for moderate pain.     budesonide-formoterol (SYMBICORT) 80-4.5 MCG/ACT inhaler Take 2 puffs first thing in am and then another 2 puffs about 12 hours later. 1 each 12   prochlorperazine (COMPAZINE) 10 MG tablet Take 1 tablet (10 mg total) by mouth every 6 (six) hours as needed (Nausea or vomiting). 60 tablet 2   No current facility-administered medications for this visit.   Facility-Administered Medications Ordered in Other Visits  Medication Dose Route Frequency Provider Last Rate Last Admin   sodium chloride flush (NS) 0.9 % injection 10 mL  10 mL Intravenous PRN Lloyd Huger, MD   10 mL at  06/01/20 0905   sodium chloride flush (NS) 0.9 % injection 10 mL  10 mL Intravenous PRN Lloyd Huger, MD   10 mL at 08/24/20 0900    OBJECTIVE: There were no vitals filed for this visit.    There is no height or weight on file to calculate BMI.    ECOG FS:0 - Asymptomatic  General: Well-developed, well-nourished, no acute distress. HEENT: Normocephalic. Neuro: Alert, answering all questions appropriately. Cranial nerves grossly intact. Psych: Normal affect.   LAB RESULTS:  Lab Results  Component Value Date   NA 134 (L) 12/08/2021   K 4.7 12/08/2021   CL 105 12/08/2021   CO2 22 12/08/2021   GLUCOSE 127 (H) 12/08/2021   BUN 46 (H) 12/08/2021   CREATININE 1.12 (H) 12/08/2021   CALCIUM 8.7 (L) 12/08/2021   PROT 7.2 07/23/2021   ALBUMIN 4.0 07/23/2021   AST 19 07/23/2021   ALT 12 07/23/2021   ALKPHOS 80 07/23/2021   BILITOT 0.9  07/23/2021   GFRNONAA 58 (L) 12/08/2021   GFRAA >60 08/24/2020    Lab Results  Component Value Date   WBC 9.3 03/07/2022   NEUTROABS 6.8 03/07/2022   HGB 15.7 (H) 03/07/2022   HCT 47.5 (H) 03/07/2022   MCV 97.5 03/07/2022   PLT 309 03/07/2022     STUDIES: DG Bone Density  Result Date: 04/25/2022 EXAM: DUAL X-RAY ABSORPTIOMETRY (DXA) FOR BONE MINERAL DENSITY IMPRESSION: Your patient Estreya Clay completed a BMD test on 04/25/2022 using the [place] (software version: 14.10) manufactured by [employer]. The following summarizes the results of our evaluation. Technologist:VLM PATIENT BIOGRAPHICAL: Name: Milagros, Middendorf Patient ID: [id number] Birth Date: 17-Jan-1966 Height: 64.0 in. Gender: Female Exam Date: 04/25/2022 Weight: 104.0 lbs. Indications: Caucasian, COPD, History of Breast Cancer, History of Fracture (Adult), Postmenopausal, Tobacco User Fractures: Right forearm Treatments: DENSITOMETRY RESULTS: Site         Region     Measured Date Measured Age WHO Classification Young Adult T-score BMD         %Change vs. Previous  Significant Change (*) AP Spine L1-L4 (L3) 04/25/2022 55.6 Osteopenia -1.9 0.950 g/cm2 - - DualFemur Neck Left 04/25/2022 55.6 Osteoporosis -2.8 0.643 g/cm2 - - Left Forearm Radius 33% 04/25/2022 55.6 Osteoporosis -3.6 0.561 g/cm2 - - ASSESSMENT: The BMD measured at Forearm Radius 33% is 0.561 g/cm2 with a T-score of -3.6. This patient is considered osteoporotic according to [facility]) criteria. L- 3 was excluded due to degenerative changes. The scan quality is good. World Pharmacologist [facility]) criteria for post-menopausal, Caucasian Women: Normal:                   T-score at or above -1 SD Osteopenia/low bone mass: T-score between -1 and -2.5 SD Osteoporosis:             T-score at or below -2.5 SD RECOMMENDATIONS: 1. All patients should optimize calcium and vitamin D intake. 2. Consider FDA-approved medical therapies in postmenopausal women and men aged 34 years and older, based on the following: a. A hip or vertebral(clinical or morphometric) fracture b. T-score < -2.5 at the femoral neck or spine after appropriate evaluation to exclude secondary causes c. Low bone mass (T-score between -1.0 and -2.5 at the femoral neck or spine) and a 10-year probability of a hip fracture > 3% or a 10-year probability of a major osteoporosis-related fracture > 20% based on the [place]-adapted WHO algorithm 3. Clinician judgment and/or patient preferences may indicate treatment for people with 10-year fracture probabilities above or below these levels FOLLOW-UP: People with diagnosed cases of osteoporosis or at high risk for fracture should have regular bone mineral density tests. For patients eligible for Medicare, routine testing is allowed once every 2 years. The testing frequency can be increased to one year for patients who have rapidly progressing disease, those who are receiving or discontinuing medical therapy to restore bone mass, or have additional risk factors. I have reviewed this report, and  agree with the above findings. [facility] Electronically Signed   By: Zerita Boers M.D.   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  PLAN:    1.  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 Because of positive margins she underwent reexcision on February 24, 2020 with clear margins.  MUGA scan from March 22, 2020 reported an EF of 72%. Given the triple negative status of her disease, she will receive adjuvant chemotherapy using Adriamycin and Cytoxan with Udenyca support followed by weekly Taxol x12.  She completed her chemotherapy on August 24, 2020.  Patient has now completed adjuvant XRT.  An aromatase inhibitor would not offer any benefit given the triple negative status of her disease.  No intervention is needed at this time.  Return to clinic in 6 months for routine evaluation.  Patient's next mammogram will be due in September 2022. 2.  Anxiety/insomnia: Patient does not complain of this today.  Continue evaluation and treatment per primary care. 3.  Migraines: Resolved.  Patient was previously given a referral to neurology for further evaluation. 4.  Depression: Resolved.  Patient declined initiation of antidepressant medication or referral for counseling. 5.  Breast erythema: Resolved. 6.  Genetics: Results pending at time of dictation.  I provided 20 minutes of face-to-face video visit time during this encounter which included chart review, counseling, and coordination of care as documented above.   Patient expressed understanding and was in agreement with this plan. She also understands that She can call clinic at any time with any questions, concerns, or complaints.    Cancer Staging  Carcinoma of upper-inner quadrant of left female breast [facility]) Staging form: Breast,  AJCC 8th Edition - Clinical stage from 02/01/2020: Stage IB (cT1c, cN0, cM0, G3, ER-, PR-, HER2-) - Signed by Lloyd Huger, MD on 02/01/2020 Stage prefix: Initial diagnosis Histologic grading system: 3 grade system   Lloyd Huger, MD   05/03/2022 10:43 AM

## 2022-05-08 ENCOUNTER — Other Ambulatory Visit: Payer: Self-pay

## 2022-05-08 DIAGNOSIS — Z171 Estrogen receptor negative status [ER-]: Secondary | ICD-10-CM

## 2022-05-09 ENCOUNTER — Encounter: Payer: Self-pay | Admitting: Oncology

## 2022-05-09 ENCOUNTER — Inpatient Hospital Stay (HOSPITAL_BASED_OUTPATIENT_CLINIC_OR_DEPARTMENT_OTHER): Payer: Medicaid Other | Admitting: Oncology

## 2022-05-09 ENCOUNTER — Inpatient Hospital Stay: Payer: Medicaid Other | Attending: Oncology

## 2022-05-09 VITALS — BP 131/101 | HR 83 | Temp 97.7°F | Resp 18 | Wt 108.0 lb

## 2022-05-09 DIAGNOSIS — Z171 Estrogen receptor negative status [ER-]: Secondary | ICD-10-CM

## 2022-05-09 DIAGNOSIS — Z923 Personal history of irradiation: Secondary | ICD-10-CM | POA: Diagnosis not present

## 2022-05-09 DIAGNOSIS — M810 Age-related osteoporosis without current pathological fracture: Secondary | ICD-10-CM | POA: Insufficient documentation

## 2022-05-09 DIAGNOSIS — C50212 Malignant neoplasm of upper-inner quadrant of left female breast: Secondary | ICD-10-CM | POA: Insufficient documentation

## 2022-05-09 DIAGNOSIS — Z9221 Personal history of antineoplastic chemotherapy: Secondary | ICD-10-CM | POA: Insufficient documentation

## 2022-05-09 DIAGNOSIS — F17210 Nicotine dependence, cigarettes, uncomplicated: Secondary | ICD-10-CM | POA: Insufficient documentation

## 2022-05-09 DIAGNOSIS — Z79811 Long term (current) use of aromatase inhibitors: Secondary | ICD-10-CM | POA: Diagnosis not present

## 2022-05-09 LAB — CBC WITH DIFFERENTIAL/PLATELET
Abs Immature Granulocytes: 0.02 10*3/uL (ref 0.00–0.07)
Basophils Absolute: 0.1 10*3/uL (ref 0.0–0.1)
Basophils Relative: 1 %
Eosinophils Absolute: 0.4 10*3/uL (ref 0.0–0.5)
Eosinophils Relative: 6 %
HCT: 45.9 % (ref 36.0–46.0)
Hemoglobin: 15.1 g/dL — ABNORMAL HIGH (ref 12.0–15.0)
Immature Granulocytes: 0 %
Lymphocytes Relative: 27 %
Lymphs Abs: 2.1 10*3/uL (ref 0.7–4.0)
MCH: 32.5 pg (ref 26.0–34.0)
MCHC: 32.9 g/dL (ref 30.0–36.0)
MCV: 98.9 fL (ref 80.0–100.0)
Monocytes Absolute: 0.5 10*3/uL (ref 0.1–1.0)
Monocytes Relative: 7 %
Neutro Abs: 4.4 10*3/uL (ref 1.7–7.7)
Neutrophils Relative %: 59 %
Platelets: 304 10*3/uL (ref 150–400)
RBC: 4.64 MIL/uL (ref 3.87–5.11)
RDW: 13.2 % (ref 11.5–15.5)
WBC: 7.5 10*3/uL (ref 4.0–10.5)
nRBC: 0 % (ref 0.0–0.2)

## 2022-05-09 LAB — COMPREHENSIVE METABOLIC PANEL
ALT: 15 U/L (ref 0–44)
AST: 21 U/L (ref 15–41)
Albumin: 3.9 g/dL (ref 3.5–5.0)
Alkaline Phosphatase: 84 U/L (ref 38–126)
Anion gap: 4 — ABNORMAL LOW (ref 5–15)
BUN: 24 mg/dL — ABNORMAL HIGH (ref 6–20)
CO2: 28 mmol/L (ref 22–32)
Calcium: 8.9 mg/dL (ref 8.9–10.3)
Chloride: 103 mmol/L (ref 98–111)
Creatinine, Ser: 0.86 mg/dL (ref 0.44–1.00)
GFR, Estimated: >60 mL/min (ref >60)
Glucose, Bld: 94 mg/dL (ref 70–99)
Potassium: 4.7 mmol/L (ref 3.5–5.1)
Sodium: 135 mmol/L (ref 135–145)
Total Bilirubin: 0.3 mg/dL (ref 0.3–1.2)
Total Protein: 7.1 g/dL (ref 6.5–8.1)

## 2022-05-09 MED ORDER — ALENDRONATE SODIUM 70 MG PO TABS: 70.0000 mg | ORAL_TABLET | ORAL | 3 refills | Status: DC

## 2022-05-09 NOTE — Addendum Note (Signed)
Addended by: Delice Bison E on: 05/09/2022 02:34 PM   Modules accepted: Orders

## 2022-05-09 NOTE — Progress Notes (Signed)
Patient denies any concerns today.  

## 2022-05-30 ENCOUNTER — Other Ambulatory Visit: Payer: Medicaid Other

## 2022-05-30 ENCOUNTER — Encounter: Payer: Medicaid Other | Admitting: Licensed Clinical Social Worker

## 2022-06-21 ENCOUNTER — Ambulatory Visit: Payer: Medicaid Other | Admitting: Internal Medicine

## 2022-08-26 ENCOUNTER — Ambulatory Visit: Payer: Medicaid Other | Admitting: Surgery

## 2022-08-30 ENCOUNTER — Ambulatory Visit: Payer: Medicaid Other | Admitting: Internal Medicine

## 2022-09-25 ENCOUNTER — Encounter: Payer: Self-pay | Admitting: Oncology

## 2022-09-25 NOTE — Telephone Encounter (Signed)
Signing encounter, See previous note 05/05/22

## 2022-11-07 ENCOUNTER — Inpatient Hospital Stay: Payer: Medicaid Other | Attending: Oncology | Admitting: Oncology

## 2022-11-07 ENCOUNTER — Telehealth: Payer: Self-pay | Admitting: *Deleted

## 2022-11-07 ENCOUNTER — Encounter: Payer: Self-pay | Admitting: Oncology

## 2022-11-07 DIAGNOSIS — Z171 Estrogen receptor negative status [ER-]: Secondary | ICD-10-CM

## 2022-11-07 DIAGNOSIS — C50212 Malignant neoplasm of upper-inner quadrant of left female breast: Secondary | ICD-10-CM

## 2022-11-07 MED ORDER — ALENDRONATE SODIUM 70 MG PO TABS: 70.0000 mg | ORAL_TABLET | ORAL | 3 refills | Status: DC

## 2022-11-07 MED ORDER — PROCHLORPERAZINE MALEATE 10 MG PO TABS: 10.0000 mg | ORAL_TABLET | Freq: Four times a day (QID) | ORAL | 2 refills | Status: DC | PRN

## 2022-11-07 NOTE — Progress Notes (Signed)
Pt states doing well. She only needs refills for fosamax and compazine.

## 2022-11-07 NOTE — Progress Notes (Signed)
[place]  [phone] [phone]) [phone]  ID: Deanna Ramsey OB: [birth year]  MR#: [id number]  QGB#:[id number]  Patient Care Team: Letta Median, MD as PCP - General (Family Medicine) Rico Junker, RN as Registered Nurse Theodore Demark, RN (Inactive) as Oncology Nurse Center, Marjory Lies, MD as Consulting Physician (General Surgery) Lloyd Huger, MD as Consulting Physician (Oncology) Anabel Bene, MD as Referring Physician (Neurology) Noreene Filbert, MD as Radiation Oncologist (Radiation Oncology)  I connected with Deanna Ramsey on 11/08/22 at  3:30 PM EST by video enabled telemedicine visit and verified that I am speaking with the correct person using two identifiers.   I discussed the limitations, risks, security and privacy concerns of performing an evaluation and management service by telemedicine and the availability of in-person appointments. I also discussed with the patient that there may be a patient responsible charge related to this service. The patient expressed understanding and agreed to proceed.   Other persons participating in the visit and their role in the encounter: Patient, MD.  Patient's location: Home. Provider's location: Clinic.  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  INTERVAL HISTORY: Patient agreed to video assisted telemedicine visit for routine 81-monthevaluation.  She continues to feel well and remains asymptomatic.  She continues to be active and work full-time. She denies any weakness or fatigue.  She has no neurologic complaints.  She has a good appetite and denies weight loss.  She has no chest pain, shortness of breath, or hemoptysis.  She denies any nausea, vomiting, constipation, or diarrhea.  She has no urinary complaints.  Patient feels at her baseline and offers no specific complaints today.  REVIEW OF SYSTEMS:    Review of Systems  Constitutional: Negative.  Negative for fever, malaise/fatigue and weight loss.  HENT:  Negative for congestion.   Respiratory: Negative.  Negative for cough, hemoptysis and shortness of breath.   Cardiovascular: Negative.  Negative for chest pain and leg swelling.  Gastrointestinal: Negative.  Negative for abdominal pain, diarrhea and nausea.  Genitourinary: Negative.  Negative for dysuria.  Musculoskeletal: Negative.  Negative for back pain.  Skin: Negative.  Negative for rash.  Neurological: Negative.  Negative for dizziness, focal weakness, weakness and headaches.  Psychiatric/Behavioral: Negative.  The patient is not nervous/anxious and does not have insomnia.     As per HPI. Otherwise, a complete review of systems is negative.  PAST MEDICAL HISTORY: Past Medical History:  Diagnosis Date   Arthritis    Asthmatic bronchitis , chronic 03/07/2022   [place]   left breast triple neagative IMC, HG DCIS   Cellulitis    COPD (chronic obstructive pulmonary disease) (HCC)    Depression    History of kidney stones    History of tracheostomy 2000   stated that epiglottis swelled up for an unknown reason; had trach for about a week.    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   Pneumonia     PAST SURGICAL HISTORY: Past Surgical History:  Procedure Laterality Date   arm surgery  1984   fractured ulna   [place]   BREAST LUMPECTOMY Left 02/04/2020   triple negative, IMC and HG DCIS with positive  margins,  6 LN's negative    BREAST LUMPECTOMY Left 02/24/2020   re excision for margins   BREAST LUMPECTOMY WITH SENTINEL LYMPH NODE BIOPSY Left 02/04/2020   Procedure: BREAST LUMPECTOMY WITH SENTINEL LYMPH NODE BX;  Surgeon: Jules Husbands, MD;  Location: [facility] ORS;  Service: General;   Laterality: Left;   CLEFT LIP REPAIR     FRACTURE SURGERY     kidney stones     lymphnode neck     PORTACATH PLACEMENT N/A 02/04/2020   Procedure: INSERTION PORT-A-CATH;  Surgeon: Jules Husbands, MD;  Location: [facility] ORS;  Service: General;  Laterality: N/A;   RE-EXCISION OF BREAST LUMPECTOMY Left 02/24/2020   Procedure: RE-EXCISION OF Left BREAST LUMPECTOMY;  Surgeon: Jules Husbands, MD;  Location: [facility] ORS;  Service: General;  Laterality: Left;   TYMPANOSTOMY TUBE PLACEMENT      FAMILY HISTORY: Family History  Problem Relation Age of Onset   COPD Other    COPD Mother    Hypertension Mother    COPD Father    Breast cancer Neg Hx     ADVANCED DIRECTIVES (Y/N):  N  HEALTH MAINTENANCE: Social History   Tobacco Use   Smoking status: Every Day    Packs/day: 2.00    Years: 40.00    Total pack years: 80.00    Types: Cigarettes   Smokeless tobacco: Never   Tobacco comments:    1PPD 03/07/2022  Vaping Use   Vaping Use: Never used  Substance Use Topics   Alcohol use: No   Drug use: No     Colonoscopy:  PAP:  Bone density:  Lipid panel:  Allergies  Allergen Reactions   Sulfa Antibiotics Nausea And Vomiting    Current Outpatient Medications  Medication Sig Dispense Refill   acetaminophen (TYLENOL) 325 MG tablet Take 650 mg by mouth every 6 (six) hours as needed for moderate pain.     budesonide-formoterol (SYMBICORT) 80-4.5 MCG/ACT inhaler Take 2 puffs first thing in am and then another 2 puffs about 12 hours later. 1 each 12   alendronate (FOSAMAX) 70 MG tablet Take 1 tablet (70 mg total) by mouth once a week. Take with a full glass of water on an empty stomach. 4 tablet 3   prochlorperazine (COMPAZINE) 10 MG tablet Take 1 tablet (10 mg total) by mouth every 6 (six) hours as needed (Nausea or vomiting). 60 tablet 2   No current facility-administered medications for this visit.   Facility-Administered Medications Ordered in Other Visits  Medication Dose Route Frequency  Provider Last Rate Last Admin   sodium chloride flush (NS) 0.9 % injection 10 mL  10 mL Intravenous PRN Lloyd Huger, MD   10 mL at 06/01/20 0905   sodium chloride flush (NS) 0.9 % injection 10 mL  10 mL Intravenous PRN Lloyd Huger, MD   10 mL at 08/24/20 0900    OBJECTIVE: There were no vitals filed for this visit.     There is no height or weight on file to calculate BMI.    ECOG FS:0 - Asymptomatic  General: Well-developed, well-nourished, no acute distress. HEENT: Normocephalic. Neuro: Alert, answering all questions appropriately. Cranial nerves grossly intact. Psych: Normal affect.  LAB RESULTS:  Lab Results  Component Value Date   NA 135 05/09/2022   K 4.7 05/09/2022   CL 103 05/09/2022   CO2 28 05/09/2022   GLUCOSE 94 05/09/2022   BUN 24 (H) 05/09/2022   CREATININE 0.86 05/09/2022   CALCIUM  8.9 05/09/2022   PROT 7.1 05/09/2022   ALBUMIN 3.9 05/09/2022   AST 21 05/09/2022   ALT 15 05/09/2022   ALKPHOS 84 05/09/2022   BILITOT 0.3 05/09/2022   GFRNONAA >60 05/09/2022   GFRAA >60 08/24/2020    Lab Results  Component Value Date   WBC 7.5 05/09/2022   NEUTROABS 4.4 05/09/2022   HGB 15.1 (H) 05/09/2022   HCT 45.9 05/09/2022   MCV 98.9 05/09/2022   PLT 304 05/09/2022     STUDIES: No results found.  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  PLAN:    1.  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 Because of positive margins she underwent reexcision on February 24, 2020 with clear margins.  She completed chemotherapy on August 24, 2020.  She subsequently also completed adjuvant XRT.  An aromatase inhibitor would not offer any benefit given the triple negative status of her disease.  Her most recent mammogram on March 21, 2022 was reported as BI-RADS 1.  Repeat in April  2024.  Patient have video-assisted telemedicine visit in 6 months for further evaluation.    2.  Osteoporosis: Bone mineral density on Apr 25, 2022 revealed a T score of -2.6.  Continue calcium, vitamin D, and Fosamax.  Repeat bone mineral density along with mammogram in approximately April 2024. 3.  Genetics: Results not available at time of dictation.   Patient expressed understanding and was in agreement with this plan. She also understands that She can call clinic at any time with any questions, concerns, or complaints.    Cancer Staging  Carcinoma of upper-inner quadrant of left female breast [facility]) Staging form: Breast, AJCC 8th Edition - Clinical stage from 02/01/2020: Stage IB (cT1c, cN0, cM0, G3, ER-, PR-, HER2-) - Signed by Lloyd Huger, MD on 02/01/2020 Stage prefix: Initial diagnosis Histologic grading system: 3 grade system   Lloyd Huger, MD   11/07/2022 3:29 PM

## 2022-11-07 NOTE — Telephone Encounter (Signed)
Refill requests taken care of.

## 2022-11-08 ENCOUNTER — Encounter: Payer: Self-pay | Admitting: Oncology

## 2022-12-02 ENCOUNTER — Emergency Department
Admission: EM | Admit: 2022-12-02 | Discharge: 2022-12-02 | Discharge status: Against Medical Advice | Payer: Medicaid Other | Attending: Emergency Medicine | Admitting: Emergency Medicine

## 2022-12-02 ENCOUNTER — Encounter: Payer: Self-pay | Admitting: Emergency Medicine

## 2022-12-02 ENCOUNTER — Other Ambulatory Visit: Payer: Self-pay

## 2022-12-02 DIAGNOSIS — Z5321 Procedure and treatment not carried out due to patient leaving prior to being seen by health care provider: Secondary | ICD-10-CM | POA: Insufficient documentation

## 2022-12-02 DIAGNOSIS — R0989 Other specified symptoms and signs involving the circulatory and respiratory systems: Secondary | ICD-10-CM | POA: Insufficient documentation

## 2022-12-02 DIAGNOSIS — R0981 Nasal congestion: Secondary | ICD-10-CM | POA: Diagnosis present

## 2022-12-02 NOTE — ED Notes (Signed)
Called for room x 3   no answer no pt found

## 2022-12-02 NOTE — ED Triage Notes (Signed)
Presents with chest and nasal congestion  unsure of fever

## 2022-12-02 NOTE — ED Provider Notes (Signed)
Patient eloped from the ER prior to any evaluation.  I did not see nor evaluate this patient   Emeline Gins 12/02/22 1450    Rada Hay, MD 12/02/22 [id number]

## 2023-04-29 ENCOUNTER — Ambulatory Visit
Admission: RE | Admit: 2023-04-29 | Discharge: 2023-04-29 | Disposition: A | Discharge status: Home / Self Care | Payer: Medicaid Other | Source: Ambulatory Visit | Attending: Oncology | Admitting: Oncology

## 2023-04-29 DIAGNOSIS — Z780 Asymptomatic menopausal state: Secondary | ICD-10-CM | POA: Insufficient documentation

## 2023-04-29 DIAGNOSIS — R9230 Dense breasts, unspecified: Secondary | ICD-10-CM | POA: Diagnosis not present

## 2023-04-29 DIAGNOSIS — M810 Age-related osteoporosis without current pathological fracture: Secondary | ICD-10-CM | POA: Insufficient documentation

## 2023-04-29 DIAGNOSIS — Z171 Estrogen receptor negative status [ER-]: Secondary | ICD-10-CM | POA: Insufficient documentation

## 2023-04-29 DIAGNOSIS — C50212 Malignant neoplasm of upper-inner quadrant of left female breast: Secondary | ICD-10-CM | POA: Diagnosis present

## 2023-04-29 DIAGNOSIS — Z1231 Encounter for screening mammogram for malignant neoplasm of breast: Secondary | ICD-10-CM | POA: Diagnosis not present

## 2023-05-01 ENCOUNTER — Inpatient Hospital Stay: Payer: Medicaid Other | Attending: Oncology | Admitting: Oncology

## 2023-05-01 ENCOUNTER — Other Ambulatory Visit: Payer: Self-pay | Admitting: Oncology

## 2023-05-01 DIAGNOSIS — Z171 Estrogen receptor negative status [ER-]: Secondary | ICD-10-CM

## 2023-05-01 DIAGNOSIS — C50212 Malignant neoplasm of upper-inner quadrant of left female breast: Secondary | ICD-10-CM | POA: Diagnosis not present

## 2023-05-01 NOTE — Progress Notes (Signed)
[facility]  Telephone:(336) [phone] Fax:(336) [phone]  ID: Deanna Ramsey OB: Apr 10, 1966  MR#: [id number]  AOZ#:[id number]  Patient Care Team: Oswaldo Conroy, MD as PCP - General (Family Medicine) Jim Like, RN as Registered Nurse Scarlett Presto, RN (Inactive) as Oncology Nurse Navigator Pabon, Merri Ray, MD as Consulting Physician (General Surgery) Jeralyn Ruths, MD as Consulting Physician (Oncology) Morene Crocker, MD as Referring Physician (Neurology) Carmina Miller, MD as Radiation Oncologist (Radiation Oncology)  I connected with Deanna Ramsey on 05/01/23 at  3:30 PM EDT by video enabled telemedicine visit and verified that I am speaking with the correct person using two identifiers.   I discussed the limitations, risks, security and privacy concerns of performing an evaluation and management service by telemedicine and the availability of in-person appointments. I also discussed with the patient that there may be a patient responsible charge related to this service. The patient expressed understanding and agreed to proceed.   Other persons participating in the visit and their role in the encounter: Patient, MD.  Patient's location: Home. Provider's location: Clinic.  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  INTERVAL HISTORY: Patient agreed to video assisted telemedicine visit for routine 11-month evaluation.  She continues to feel well and remains asymptomatic.  She continues to be active and work full-time. She has no neurologic complaints.  She has a good appetite and denies weight loss.  She has no chest pain, shortness of breath, or hemoptysis.  She denies any nausea, vomiting, constipation, or diarrhea.  She has no urinary complaints.  Patient offers no specific complaints today.  REVIEW OF SYSTEMS:   Review of Systems  Constitutional: Negative.  Negative for  fever, malaise/fatigue and weight loss.  HENT:  Negative for congestion.   Respiratory: Negative.  Negative for cough, hemoptysis and shortness of breath.   Cardiovascular: Negative.  Negative for chest pain and leg swelling.  Gastrointestinal: Negative.  Negative for abdominal pain, diarrhea and nausea.  Genitourinary: Negative.  Negative for dysuria.  Musculoskeletal: Negative.  Negative for back pain.  Skin: Negative.  Negative for rash.  Neurological: Negative.  Negative for dizziness, focal weakness, weakness and headaches.  Psychiatric/Behavioral: Negative.  The patient is not nervous/anxious and does not have insomnia.     As per HPI. Otherwise, a complete review of systems is negative.  PAST MEDICAL HISTORY: Past Medical History:  Diagnosis Date   Arthritis    Asthmatic bronchitis , chronic 03/07/2022   Breast cancer (HCC) 01/2020   left breast triple neagative IMC, HG DCIS   Cellulitis    COPD (chronic obstructive pulmonary disease) (HCC)    Depression    History of kidney stones    History of tracheostomy 2000   stated that epiglottis swelled up for an unknown reason; had trach for about a week.    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   Pneumonia     PAST SURGICAL HISTORY: Past Surgical History:  Procedure Laterality Date   arm surgery  1984   fractured ulna   BREAST BIOPSY Left 01/2020   IMC 3:00 10cmfn [place] bx   BREAST LUMPECTOMY Left 02/04/2020   triple negative, IMC and HG DCIS with positive margins,  6 LN's negative    BREAST LUMPECTOMY Left 02/24/2020  re excision for margins   BREAST LUMPECTOMY WITH SENTINEL LYMPH NODE BIOPSY Left 02/04/2020   Procedure: BREAST LUMPECTOMY WITH SENTINEL LYMPH NODE BX;  Surgeon: Leafy Ro, MD;  Location: [facility] ORS;  Service: General;  Laterality: Left;   CLEFT LIP REPAIR     FRACTURE SURGERY      kidney stones     lymphnode neck     PORTACATH PLACEMENT N/A 02/04/2020   Procedure: INSERTION PORT-A-CATH;  Surgeon: Leafy Ro, MD;  Location: [facility] ORS;  Service: General;  Laterality: N/A;   RE-EXCISION OF BREAST LUMPECTOMY Left 02/24/2020   Procedure: RE-EXCISION OF Left BREAST LUMPECTOMY;  Surgeon: Leafy Ro, MD;  Location: [facility] ORS;  Service: General;  Laterality: Left;   TYMPANOSTOMY TUBE PLACEMENT      FAMILY HISTORY: Family History  Problem Relation Age of Onset   COPD Other    COPD Mother    Hypertension Mother    COPD Father    Breast cancer Neg Hx     ADVANCED DIRECTIVES (Y/N):  N  HEALTH MAINTENANCE: Social History   Tobacco Use   Smoking status: Every Day    Packs/day: 2.00    Years: 40.00    Additional pack years: 0.00    Total pack years: 80.00    Types: Cigarettes   Smokeless tobacco: Never   Tobacco comments:    1PPD 03/07/2022  Vaping Use   Vaping Use: Never used  Substance Use Topics   Alcohol use: No   Drug use: No     Colonoscopy:  PAP:  Bone density:  Lipid panel:  Allergies  Allergen Reactions   Sulfa Antibiotics Nausea And Vomiting    Current Outpatient Medications  Medication Sig Dispense Refill   acetaminophen (TYLENOL) 325 MG tablet Take 650 mg by mouth every 6 (six) hours as needed for moderate pain.     alendronate (FOSAMAX) 70 MG tablet Take 1 tablet (70 mg total) by mouth once a week. Take with a full glass of water on an empty stomach. 4 tablet 3   budesonide-formoterol (SYMBICORT) 80-4.5 MCG/ACT inhaler Take 2 puffs first thing in am and then another 2 puffs about 12 hours later. 1 each 12   prochlorperazine (COMPAZINE) 10 MG tablet Take 1 tablet (10 mg total) by mouth every 6 (six) hours as needed (Nausea or vomiting). 60 tablet 2   No current facility-administered medications for this visit.   Facility-Administered Medications Ordered in Other Visits  Medication Dose Route Frequency Provider Last Rate Last Admin    sodium chloride flush (NS) 0.9 % injection 10 mL  10 mL Intravenous PRN Jeralyn Ruths, MD   10 mL at 06/01/20 0905   sodium chloride flush (NS) 0.9 % injection 10 mL  10 mL Intravenous PRN Jeralyn Ruths, MD   10 mL at 08/24/20 0900    OBJECTIVE: There were no vitals filed for this visit.     There is no height or weight on file to calculate BMI.    ECOG FS:0 - Asymptomatic  General: Well-developed, well-nourished, no acute distress. HEENT: Normocephalic. Neuro: Alert, answering all questions appropriately. Cranial nerves grossly intact. Psych: Normal affect.  LAB RESULTS:  Lab Results  Component Value Date   NA 135 05/09/2022   K 4.7 05/09/2022   CL 103 05/09/2022   CO2 28 05/09/2022   GLUCOSE 94 05/09/2022   BUN 24 (H) 05/09/2022   CREATININE 0.86 05/09/2022   CALCIUM 8.9 05/09/2022   PROT 7.1 05/09/2022  ALBUMIN 3.9 05/09/2022   AST 21 05/09/2022   ALT 15 05/09/2022   ALKPHOS 84 05/09/2022   BILITOT 0.3 05/09/2022   GFRNONAA >60 05/09/2022   GFRAA >60 08/24/2020    Lab Results  Component Value Date   WBC 7.5 05/09/2022   NEUTROABS 4.4 05/09/2022   HGB 15.1 (H) 05/09/2022   HCT 45.9 05/09/2022   MCV 98.9 05/09/2022   PLT 304 05/09/2022     STUDIES: MM 3D SCREEN BREAST BILATERAL  Result Date: 04/30/2023 CLINICAL DATA:  Screening. EXAM: DIGITAL SCREENING BILATERAL MAMMOGRAM WITH TOMOSYNTHESIS AND CAD TECHNIQUE: Bilateral screening digital craniocaudal and mediolateral oblique mammograms were obtained. Bilateral screening digital breast tomosynthesis was performed. The images were evaluated with computer-aided detection. COMPARISON:  Previous exam(s). ACR Breast Density Category d: The breasts are extremely dense, which lowers the sensitivity of mammography. FINDINGS: There are no findings suspicious for malignancy. IMPRESSION: No mammographic evidence of malignancy. A result letter of this screening mammogram will be mailed directly to the patient.  RECOMMENDATION: Screening mammogram in one year. (Code:SM-B-01Y) BI-RADS CATEGORY  1: Negative. Electronically Signed   By: Baird Lyons M.D.   On: 04/30/2023 13:14   DG Bone Density  Result Date: 04/29/2023 EXAM: DUAL X-RAY ABSORPTIOMETRY (DXA) FOR BONE MINERAL DENSITY IMPRESSION: Your patient Dahlila Hrivnak completed a BMD test on 04/29/2023 using the [employer] iDXA DXA System (software version: 14.10) manufactured by [employer]. The following summarizes the results of our evaluation. Technologist: MTB PATIENT BIOGRAPHICAL: Name: Seila, Fortman Patient ID: [id number] Birth Date: 07-07-1966 Height: 65.0 in. Gender: Female Exam Date: 04/29/2023 Weight: 113.0 lbs. Indications: Caucasian, COPD, History of Breast Cancer, Postmenopausal, Tobacco User Fractures: Right forearm Treatments: DENSITOMETRY RESULTS: Site         Region     Measured Date Measured Age WHO Classification Young Adult T-score BMD         %Change vs. Previous Significant Change (*) DualFemur Total Left 04/29/2023 56.6 Osteoporosis -3.1 0.612 g/cm2 6.8% Yes DualFemur Total Left 04/25/2022 55.6 Osteoporosis -3.5 0.573 g/cm2 - - DualFemur Total Mean 04/29/2023 56.6 Osteoporosis -3.0 0.629 g/cm2 7.0% Yes DualFemur Total Mean 04/25/2022 55.6 Osteoporosis -3.3 0.588 g/cm2 - - Left Forearm Radius 33% 04/29/2023 56.6 Osteoporosis -3.8 0.545 g/cm2 -2.9% - Left Forearm Radius 33% 04/25/2022 55.6 Osteoporosis -3.6 0.561 g/cm2 - - ASSESSMENT: The BMD measured at Forearm Radius 33% is 0.545 g/cm2 with a T-score of -3.8. This patient is considered osteoporotic according to World Health Organization [facility]) criteria. The scan quality is good. Lumbar spine was not utilized due to advanced degenerative changes. Compared with prior study, there has been significant increase in the total hip. World Science writer [facility]) criteria for post-menopausal, Caucasian Women: Normal:                   T-score at or above -1 SD Osteopenia/low bone mass: T-score between -1  and -2.5 SD Osteoporosis:             T-score at or below -2.5 SD RECOMMENDATIONS: 1. All patients should optimize calcium and vitamin D intake. 2. Consider FDA-approved medical therapies in postmenopausal women and men aged 50 years and older, based on the following: a. A hip or vertebral(clinical or morphometric) fracture b. T-score < -2.5 at the femoral neck or spine after appropriate evaluation to exclude secondary causes c. Low bone mass (T-score between -1.0 and -2.5 at the femoral neck or spine) and a 10-year probability of a hip fracture > 3% or a  10-year probability of a major osteoporosis-related fracture > 20% based on the [place]-adapted WHO algorithm 3. Clinician judgment and/or patient preferences may indicate treatment for people with 10-year fracture probabilities above or below these levels FOLLOW-UP: People with diagnosed cases of osteoporosis or at high risk for fracture should have regular bone mineral density tests. For patients eligible for Medicare, routine testing is allowed once every 2 years. The testing frequency can be increased to one year for patients who have rapidly progressing disease, those who are receiving or discontinuing medical therapy to restore bone mass, or have additional risk factors. I have reviewed this report, and agree with the above findings. [facility] Electronically Signed   By: Romona Curls M.D.   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 Because of positive margins she underwent reexcision on February 24, 2020 with clear margins.  She completed chemotherapy on August 24, 2020.  She subsequently also completed adjuvant XRT.  An aromatase inhibitor would not offer any benefit given the triple  negative status of her disease.  Her most recent mammogram on Apr 29, 2023 was reported as BI-RADS 1.  Repeat in May 2025.  Patient video-assisted telemedicine visit in 6 months for routine evaluation.   Osteoporosis: Patient's most recent bone mineral density on Apr 29, 2023 reported T-score of -3.8 which is significantly worse than 1 year prior where her T-score was reported -2.6.  Patient states she has been noncompliant with Fosamax, but plans to restart treatment this week.  Repeat bone mineral density in May 2025.  If no improvement, patient will likely need to be switched to Prolia every 6 months.   Genetics: Unclear if patient ever completed genetic testing.  I provided 20 minutes of face-to-face video visit time during this encounter which included chart review, counseling, and coordination of care as documented above.    Patient expressed understanding and was in agreement with this plan. She also understands that She can call clinic at any time with any questions, concerns, or complaints.    Cancer Staging  Carcinoma of upper-inner quadrant of left female breast [facility]) Staging form: Breast, AJCC 8th Edition - Clinical stage from 02/01/2020: Stage IB (cT1c, cN0, cM0, G3, ER-, PR-, HER2-) - Signed by Jeralyn Ruths, MD on 02/01/2020 Stage prefix: Initial diagnosis Histologic grading system: 3 grade system   Jeralyn Ruths, MD   05/01/2023 3:52 PM

## 2023-05-15 ENCOUNTER — Other Ambulatory Visit: Payer: Self-pay | Admitting: Internal Medicine

## 2023-05-15 DIAGNOSIS — J4489 Other specified chronic obstructive pulmonary disease: Secondary | ICD-10-CM

## 2023-10-02 ENCOUNTER — Other Ambulatory Visit: Payer: Self-pay | Admitting: Oncology

## 2023-11-04 ENCOUNTER — Inpatient Hospital Stay: Payer: Medicaid Other | Admitting: Oncology

## 2023-11-12 ENCOUNTER — Ambulatory Visit: Payer: Medicaid Other | Admitting: Oncology

## 2023-11-18 ENCOUNTER — Encounter: Payer: Self-pay | Admitting: Oncology

## 2023-11-18 ENCOUNTER — Inpatient Hospital Stay: Payer: Medicaid Other | Attending: Oncology | Admitting: Oncology

## 2023-11-18 VITALS — BP 147/99 | HR 94 | Temp 96.6°F | Resp 18 | Ht 65.0 in | Wt 110.0 lb

## 2023-11-18 DIAGNOSIS — Z171 Estrogen receptor negative status [ER-]: Secondary | ICD-10-CM | POA: Insufficient documentation

## 2023-11-18 DIAGNOSIS — C50212 Malignant neoplasm of upper-inner quadrant of left female breast: Secondary | ICD-10-CM | POA: Insufficient documentation

## 2023-11-18 DIAGNOSIS — M810 Age-related osteoporosis without current pathological fracture: Secondary | ICD-10-CM | POA: Insufficient documentation

## 2023-11-18 DIAGNOSIS — Z9221 Personal history of antineoplastic chemotherapy: Secondary | ICD-10-CM | POA: Diagnosis not present

## 2023-11-18 DIAGNOSIS — Z923 Personal history of irradiation: Secondary | ICD-10-CM | POA: Insufficient documentation

## 2023-11-18 NOTE — Progress Notes (Signed)
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  INTERVAL HISTORY: Patient returns to clinic today for routine 59-month evaluation.  She continues to feel well and remains asymptomatic.  She continues to be active and work full-time. She has no neurologic complaints.  She has a good appetite and denies weight loss.  She has no chest pain, shortness of breath, or hemoptysis.  She denies any nausea, vomiting, constipation, or diarrhea.  She has no urinary complaints.  Patient offers no specific complaints today.  REVIEW OF SYSTEMS:   Review of Systems  Constitutional: Negative.  Negative for fever, malaise/fatigue and weight loss.  HENT:  Negative for congestion.   Respiratory: Negative.  Negative for cough, hemoptysis and shortness of breath.   Cardiovascular: Negative.  Negative for chest pain and leg swelling.  Gastrointestinal: Negative.  Negative for abdominal pain, diarrhea and nausea.  Genitourinary: Negative.  Negative for dysuria.  Musculoskeletal: Negative.  Negative for back pain.  Skin: Negative.  Negative for rash.  Neurological: Negative.  Negative for dizziness, focal weakness, weakness and headaches.  Psychiatric/Behavioral: Negative.  The patient is not nervous/anxious and does not have insomnia.     As per HPI. Otherwise, a complete review of systems is negative.  PAST MEDICAL HISTORY: Past Medical History:  Diagnosis Date   Arthritis     Asthmatic bronchitis , chronic (HCC) 03/07/2022   Breast cancer (HCC) 01/2020   left breast triple neagative IMC, HG DCIS   Cellulitis    COPD (chronic obstructive pulmonary disease) (HCC)    Depression    History of kidney stones    History of tracheostomy 2000   stated that epiglottis swelled up for an unknown reason; had trach for about a week.    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   Pneumonia     PAST SURGICAL HISTORY: Past Surgical History:  Procedure Laterality Date   arm surgery  1984   fractured ulna   BREAST BIOPSY Left 01/2020   IMC 3:00 10cmfn [place] bx   BREAST LUMPECTOMY Left 02/04/2020   triple negative, IMC and HG DCIS with positive margins,  6 LN's negative    BREAST LUMPECTOMY Left 02/24/2020   re excision for margins   BREAST LUMPECTOMY WITH SENTINEL LYMPH NODE BIOPSY Left 02/04/2020   Procedure: BREAST LUMPECTOMY WITH SENTINEL LYMPH NODE BX;  Surgeon: Leafy Ro, MD;  Location: [facility] ORS;  Service: General;  Laterality: Left;   CLEFT LIP REPAIR     FRACTURE SURGERY     kidney stones     lymphnode neck     PORTACATH PLACEMENT N/A 02/04/2020   Procedure: INSERTION PORT-A-CATH;  Surgeon: Leafy Ro, MD;  Location: [facility] ORS;  Service: General;  Laterality: N/A;   RE-EXCISION OF BREAST LUMPECTOMY Left 02/24/2020   Procedure: RE-EXCISION OF Left BREAST LUMPECTOMY;  Surgeon: Leafy Ro, MD;  Location: [facility] ORS;  Service: General;  Laterality: Left;   TYMPANOSTOMY TUBE PLACEMENT      FAMILY HISTORY: Family History  Problem Relation Age of Onset   COPD Other    COPD Mother    Hypertension Mother    COPD Father    Breast cancer Neg Hx     ADVANCED DIRECTIVES (Y/N):  N  HEALTH MAINTENANCE: Social History   Tobacco Use   Smoking status: Every Day    Current packs/day: 2.00    Average packs/day: 2.0 packs/day for 40.0 years  (80.0 ttl pk-yrs)    Types: Cigarettes   Smokeless tobacco: Never   Tobacco comments:    1PPD 03/07/2022  Vaping Use   Vaping status: Never Used  Substance Use Topics   Alcohol use: No   Drug use: No     Colonoscopy:  PAP:  Bone density:  Lipid panel:  Allergies  Allergen Reactions   Sulfa Antibiotics Nausea And Vomiting    Current Outpatient Medications  Medication Sig Dispense Refill   acetaminophen (TYLENOL) 325 MG tablet Take 650 mg by mouth every 6 (six) hours as needed for moderate pain.     alendronate (FOSAMAX) 70 MG tablet TAKE 1 TABLET(70 MG) BY MOUTH 1 TIME A WEEK WITH A FULL GLASS OF WATER AND ON AN EMPTY STOMACH 4 tablet 3   budesonide-formoterol (SYMBICORT) 80-4.5 MCG/ACT inhaler Take 2 puffs first thing in am and then another 2 puffs about 12 hours later. 1 each 12   prochlorperazine (COMPAZINE) 10 MG tablet Take 1 tablet (10 mg total) by mouth every 6 (six) hours as needed (Nausea or vomiting). 60 tablet 2   No current facility-administered medications for this visit.   Facility-Administered Medications Ordered in Other Visits  Medication Dose Route Frequency Provider Last Rate Last Admin   sodium chloride flush (NS) 0.9 % injection 10 mL  10 mL Intravenous PRN Jeralyn Ruths, MD   10 mL at 06/01/20 0905   sodium chloride flush (NS) 0.9 % injection 10 mL  10 mL Intravenous PRN Jeralyn Ruths, MD   10 mL at 08/24/20 0900    OBJECTIVE: Vitals:   11/18/23 1416  BP: (!) 147/99  Pulse: 94  Resp: 18  Temp: (!) 96.6 F (35.9 C)  SpO2: 98%       Body mass index is 18.3 kg/m.    ECOG FS:0 - Asymptomatic  General: Well-developed, well-nourished, no acute distress. Eyes: Pink conjunctiva, anicteric sclera. HEENT: Normocephalic, moist mucous membranes. Breast: Exam deferred today. Lungs: No audible wheezing or coughing. Heart: Regular rate and rhythm. Abdomen: Soft, nontender, no obvious distention. Musculoskeletal: No edema, cyanosis, or  clubbing. Neuro: Alert, answering all questions appropriately. Cranial nerves grossly intact. Skin: No rashes or petechiae noted. Psych: Normal affect.   LAB RESULTS:  Lab Results  Component Value Date   NA 135 05/09/2022   K 4.7 05/09/2022   CL 103 05/09/2022   CO2 28 05/09/2022   GLUCOSE 94 05/09/2022   BUN 24 (H) 05/09/2022   CREATININE 0.86 05/09/2022   CALCIUM 8.9 05/09/2022   PROT 7.1 05/09/2022   ALBUMIN 3.9 05/09/2022   AST 21 05/09/2022   ALT 15 05/09/2022   ALKPHOS 84 05/09/2022   BILITOT 0.3 05/09/2022   GFRNONAA >60 05/09/2022   GFRAA >60 08/24/2020    Lab Results  Component Value Date   WBC 7.5 05/09/2022   NEUTROABS 4.4 05/09/2022   HGB 15.1 (H) 05/09/2022   HCT 45.9 05/09/2022   MCV 98.9 05/09/2022   PLT  304 05/09/2022     STUDIES: No results found.  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 Because of positive margins she underwent reexcision on February 24, 2020 with clear margins.  She completed chemotherapy on August 24, 2020.  She subsequently also completed adjuvant XRT.  An aromatase inhibitor would not offer any benefit given the triple negative status of her disease.  Her most recent mammogram on Apr 29, 2023 was reported as BI-RADS 1.  Repeat again in May 2025.  Follow-up 2 to 3 days later for further evaluation.  Once patient is 5 years removed from completing her chemotherapy in September 2026, she likely can be discharged from clinic.   Osteoporosis: Patient's most recent bone mineral density on Apr 29, 2023 reported T-score of -3.8 which is significantly worse than 1 year prior where her T-score was reported -2.6.  Patient reports that she is more compliant with her Fosamax.  She has also been instructed to  continue calcium and vitamin D supplementation.  Repeat bone mineral density in May 2025 along with mammogram.  If no improvement, patient will likely need to be switched to Prolia every 6 months.   Genetics: Unclear if patient ever completed genetic testing.  I spent a total of 20 minutes reviewing chart data, face-to-face evaluation with the patient, counseling and coordination of care as detailed above.   Patient expressed understanding and was in agreement with this plan. She also understands that She can call clinic at any time with any questions, concerns, or complaints.    Cancer Staging  Carcinoma of upper-inner quadrant of left female breast [facility]) Staging form: Breast, AJCC 8th Edition - Clinical stage from 02/01/2020: Stage IB (cT1c, cN0, cM0, G3, ER-, PR-, HER2-) - Signed by Jeralyn Ruths, MD on 02/01/2020 Stage prefix: Initial diagnosis Histologic grading system: 3 grade system   Jeralyn Ruths, MD   11/18/2023 2:28 PM

## 2024-02-13 ENCOUNTER — Emergency Department

## 2024-02-13 ENCOUNTER — Other Ambulatory Visit: Payer: Self-pay

## 2024-02-13 ENCOUNTER — Inpatient Hospital Stay
Admission: EM | Admit: 2024-02-13 | Discharge: 2024-02-15 | DRG: 194 | Disposition: A | Discharge status: Home / Self Care | Attending: Osteopathic Medicine | Admitting: Osteopathic Medicine

## 2024-02-13 DIAGNOSIS — Z7983 Long term (current) use of bisphosphonates: Secondary | ICD-10-CM

## 2024-02-13 DIAGNOSIS — F17210 Nicotine dependence, cigarettes, uncomplicated: Secondary | ICD-10-CM | POA: Diagnosis not present

## 2024-02-13 DIAGNOSIS — Z882 Allergy status to sulfonamides status: Secondary | ICD-10-CM

## 2024-02-13 DIAGNOSIS — R636 Underweight: Secondary | ICD-10-CM | POA: Diagnosis present

## 2024-02-13 DIAGNOSIS — Z9221 Personal history of antineoplastic chemotherapy: Secondary | ICD-10-CM

## 2024-02-13 DIAGNOSIS — Z87730 Personal history of (corrected) cleft lip and palate: Secondary | ICD-10-CM

## 2024-02-13 DIAGNOSIS — J441 Chronic obstructive pulmonary disease with (acute) exacerbation: Principal | ICD-10-CM

## 2024-02-13 DIAGNOSIS — Z716 Tobacco abuse counseling: Secondary | ICD-10-CM

## 2024-02-13 DIAGNOSIS — Z853 Personal history of malignant neoplasm of breast: Secondary | ICD-10-CM

## 2024-02-13 DIAGNOSIS — J189 Pneumonia, unspecified organism: Principal | ICD-10-CM | POA: Diagnosis present

## 2024-02-13 DIAGNOSIS — Z681 Body mass index (BMI) 19 or less, adult: Secondary | ICD-10-CM

## 2024-02-13 DIAGNOSIS — Z825 Family history of asthma and other chronic lower respiratory diseases: Secondary | ICD-10-CM

## 2024-02-13 DIAGNOSIS — J188 Other pneumonia, unspecified organism: Secondary | ICD-10-CM | POA: Diagnosis present

## 2024-02-13 DIAGNOSIS — Z8249 Family history of ischemic heart disease and other diseases of the circulatory system: Secondary | ICD-10-CM

## 2024-02-13 DIAGNOSIS — Z23 Encounter for immunization: Secondary | ICD-10-CM

## 2024-02-13 DIAGNOSIS — Z1152 Encounter for screening for COVID-19: Secondary | ICD-10-CM

## 2024-02-13 DIAGNOSIS — R0602 Shortness of breath: Secondary | ICD-10-CM

## 2024-02-13 DIAGNOSIS — J449 Chronic obstructive pulmonary disease, unspecified: Secondary | ICD-10-CM

## 2024-02-13 DIAGNOSIS — J439 Emphysema, unspecified: Secondary | ICD-10-CM | POA: Diagnosis present

## 2024-02-13 DIAGNOSIS — Z7951 Long term (current) use of inhaled steroids: Secondary | ICD-10-CM

## 2024-02-13 DIAGNOSIS — E041 Nontoxic single thyroid nodule: Secondary | ICD-10-CM | POA: Diagnosis present

## 2024-02-13 DIAGNOSIS — J440 Chronic obstructive pulmonary disease with acute lower respiratory infection: Secondary | ICD-10-CM | POA: Diagnosis present

## 2024-02-13 DIAGNOSIS — B953 Streptococcus pneumoniae as the cause of diseases classified elsewhere: Secondary | ICD-10-CM | POA: Diagnosis present

## 2024-02-13 DIAGNOSIS — Z923 Personal history of irradiation: Secondary | ICD-10-CM

## 2024-02-13 DIAGNOSIS — R197 Diarrhea, unspecified: Secondary | ICD-10-CM | POA: Diagnosis present

## 2024-02-13 DIAGNOSIS — Z87442 Personal history of urinary calculi: Secondary | ICD-10-CM

## 2024-02-13 DIAGNOSIS — Z6841 Body Mass Index (BMI) 40.0 and over, adult: Secondary | ICD-10-CM

## 2024-02-13 DIAGNOSIS — I10 Essential (primary) hypertension: Secondary | ICD-10-CM | POA: Diagnosis present

## 2024-02-13 LAB — RESPIRATORY PANEL BY PCR

## 2024-02-13 LAB — D-DIMER, QUANTITATIVE: D-Dimer, Quant: 2.67 ug{FEU}/mL — ABNORMAL HIGH (ref 0.00–0.50)

## 2024-02-13 LAB — TSH: TSH: 0.8 u[IU]/mL (ref 0.350–4.500)

## 2024-02-13 LAB — COMPREHENSIVE METABOLIC PANEL
ALT: 14 U/L (ref 0–44)
AST: 18 U/L (ref 15–41)
Albumin: 2.7 g/dL — ABNORMAL LOW (ref 3.5–5.0)
Alkaline Phosphatase: 81 U/L (ref 38–126)
Anion gap: 8 (ref 5–15)
BUN: 16 mg/dL (ref 6–20)
CO2: 24 mmol/L (ref 22–32)
Calcium: 9.1 mg/dL (ref 8.9–10.3)
Chloride: 106 mmol/L (ref 98–111)
Creatinine, Ser: 0.75 mg/dL (ref 0.44–1.00)
GFR, Estimated: >60 mL/min (ref >60)
Glucose, Bld: 109 mg/dL — ABNORMAL HIGH (ref 70–99)
Potassium: 3.9 mmol/L (ref 3.5–5.1)
Sodium: 138 mmol/L (ref 135–145)
Total Bilirubin: 0.7 mg/dL (ref 0.0–1.2)
Total Protein: 7.1 g/dL (ref 6.5–8.1)

## 2024-02-13 LAB — SARS CORONAVIRUS 2 BY RT PCR: SARS Coronavirus 2 by RT PCR: NEGATIVE

## 2024-02-13 LAB — CBC
HCT: 38.3 % (ref 36.0–46.0)
Hemoglobin: 12.5 g/dL (ref 12.0–15.0)
MCH: 30.9 pg (ref 26.0–34.0)
MCHC: 32.6 g/dL (ref 30.0–36.0)
MCV: 94.8 fL (ref 80.0–100.0)
Platelets: 617 10*3/uL — ABNORMAL HIGH (ref 150–400)
RBC: 4.04 MIL/uL (ref 3.87–5.11)
RDW: 14.4 % (ref 11.5–15.5)
WBC: 9.1 10*3/uL (ref 4.0–10.5)
nRBC: 0 % (ref 0.0–0.2)

## 2024-02-13 LAB — TROPONIN I (HIGH SENSITIVITY)
Troponin I (High Sensitivity): 7 ng/L (ref <18)
Troponin I (High Sensitivity): 6 ng/L (ref <18)

## 2024-02-13 LAB — T4, FREE: Free T4: 1.04 ng/dL (ref 0.61–1.12)

## 2024-02-13 LAB — BRAIN NATRIURETIC PEPTIDE: B Natriuretic Peptide: 57.6 pg/mL (ref 0.0–100.0)

## 2024-02-13 LAB — LACTIC ACID, PLASMA: Lactic Acid, Venous: 1.7 mmol/L (ref 0.5–1.9)

## 2024-02-13 LAB — STREP PNEUMONIAE URINARY ANTIGEN: Strep Pneumo Urinary Antigen: POSITIVE — AB

## 2024-02-13 LAB — PROCALCITONIN: Procalcitonin: <0.1 ng/mL

## 2024-02-13 MED ORDER — PREDNISONE 20 MG PO TABS
40.0000 mg | ORAL_TABLET | Freq: Every day | ORAL | Status: DC
  Administered 2024-02-14 – 2024-02-15 (×2): 40 mg via ORAL
  Filled 2024-02-13 (×2): qty 2

## 2024-02-13 MED ORDER — IPRATROPIUM-ALBUTEROL 0.5-2.5 (3) MG/3ML IN SOLN
6.0000 mL | Freq: Once | RESPIRATORY_TRACT | Status: AC
  Administered 2024-02-13: 6 mL via RESPIRATORY_TRACT
  Filled 2024-02-13: qty 6

## 2024-02-13 MED ORDER — LACTATED RINGERS IV SOLN: INTRAVENOUS | Status: AC

## 2024-02-13 MED ORDER — IOHEXOL 350 MG/ML SOLN
75.0000 mL | Freq: Once | INTRAVENOUS | Status: AC | PRN
  Administered 2024-02-13: 75 mL via INTRAVENOUS

## 2024-02-13 MED ORDER — HYDROCODONE BIT-HOMATROP MBR 5-1.5 MG/5ML PO SOLN
5.0000 mL | Freq: Four times a day (QID) | ORAL | Status: DC | PRN
Start: 2024-02-13 — End: 2024-02-15
  Administered 2024-02-13 – 2024-02-15 (×6): 5 mL via ORAL
  Filled 2024-02-13 (×6): qty 5

## 2024-02-13 MED ORDER — IPRATROPIUM-ALBUTEROL 0.5-2.5 (3) MG/3ML IN SOLN
3.0000 mL | Freq: Four times a day (QID) | RESPIRATORY_TRACT | Status: DC
  Administered 2024-02-13: 3 mL via RESPIRATORY_TRACT
  Filled 2024-02-13: qty 3

## 2024-02-13 MED ORDER — SODIUM CHLORIDE 0.9 % IV SOLN
2.0000 g | Freq: Once | INTRAVENOUS | Status: AC
  Administered 2024-02-13: 2 g via INTRAVENOUS
  Filled 2024-02-13: qty 12.5

## 2024-02-13 MED ORDER — ACETAMINOPHEN 650 MG RE SUPP: 650.0000 mg | Freq: Four times a day (QID) | RECTAL | Status: DC | PRN

## 2024-02-13 MED ORDER — IPRATROPIUM-ALBUTEROL 0.5-2.5 (3) MG/3ML IN SOLN
3.0000 mL | Freq: Four times a day (QID) | RESPIRATORY_TRACT | Status: DC
  Administered 2024-02-13 – 2024-02-14 (×2): 3 mL via RESPIRATORY_TRACT
  Filled 2024-02-13 (×2): qty 3

## 2024-02-13 MED ORDER — ONDANSETRON HCL 4 MG PO TABS
4.0000 mg | ORAL_TABLET | Freq: Four times a day (QID) | ORAL | Status: DC | PRN
  Administered 2024-02-15: 4 mg via ORAL
  Filled 2024-02-13: qty 1

## 2024-02-13 MED ORDER — INFLUENZA VIRUS VACC SPLIT PF (FLUZONE) 0.5 ML IM SUSY
0.5000 mL | PREFILLED_SYRINGE | INTRAMUSCULAR | Status: AC
  Administered 2024-02-14: 0.5 mL via INTRAMUSCULAR
  Filled 2024-02-13: qty 0.5

## 2024-02-13 MED ORDER — METHYLPREDNISOLONE SODIUM SUCC 125 MG IJ SOLR
125.0000 mg | Freq: Once | INTRAMUSCULAR | Status: AC
  Administered 2024-02-13: 125 mg via INTRAVENOUS
  Filled 2024-02-13: qty 2

## 2024-02-13 MED ORDER — CEFADROXIL 500 MG PO CAPS
500.0000 mg | ORAL_CAPSULE | Freq: Two times a day (BID) | ORAL | Status: DC
  Administered 2024-02-14 – 2024-02-15 (×2): 500 mg via ORAL
  Filled 2024-02-13 (×3): qty 1

## 2024-02-13 MED ORDER — SODIUM CHLORIDE 0.9 % IV SOLN
500.0000 mg | INTRAVENOUS | Status: DC
  Administered 2024-02-13: 500 mg via INTRAVENOUS
  Filled 2024-02-13: qty 5

## 2024-02-13 MED ORDER — ACETAMINOPHEN 500 MG PO TABS: 1000.0000 mg | ORAL_TABLET | Freq: Four times a day (QID) | ORAL | Status: DC | PRN

## 2024-02-13 MED ORDER — MELATONIN 5 MG PO TABS
10.0000 mg | ORAL_TABLET | Freq: Every evening | ORAL | Status: DC | PRN
  Administered 2024-02-14: 10 mg via ORAL
  Filled 2024-02-13: qty 2

## 2024-02-13 MED ORDER — AZITHROMYCIN 250 MG PO TABS
500.0000 mg | ORAL_TABLET | Freq: Every day | ORAL | Status: AC
  Administered 2024-02-14 – 2024-02-15 (×2): 500 mg via ORAL
  Filled 2024-02-13 (×2): qty 2

## 2024-02-13 MED ORDER — ONDANSETRON HCL 4 MG/2ML IJ SOLN
4.0000 mg | Freq: Four times a day (QID) | INTRAMUSCULAR | Status: DC | PRN
  Administered 2024-02-13 – 2024-02-15 (×5): 4 mg via INTRAVENOUS
  Filled 2024-02-13 (×5): qty 2

## 2024-02-13 MED ORDER — HEPARIN SODIUM (PORCINE) 5000 UNIT/ML IJ SOLN
5000.0000 [IU] | Freq: Three times a day (TID) | INTRAMUSCULAR | Status: DC
  Administered 2024-02-13 – 2024-02-15 (×5): 5000 [IU] via SUBCUTANEOUS
  Filled 2024-02-13 (×5): qty 1

## 2024-02-13 MED ORDER — PNEUMOCOCCAL 20-VAL CONJ VACC 0.5 ML IM SUSY
0.5000 mL | PREFILLED_SYRINGE | INTRAMUSCULAR | Status: AC
  Administered 2024-02-14: 0.5 mL via INTRAMUSCULAR
  Filled 2024-02-13: qty 0.5

## 2024-02-13 NOTE — Assessment & Plan Note (Signed)
 02-13-2024 patient advised she needs to absolutely quit smoking.

## 2024-02-13 NOTE — Consult Note (Signed)
 CODE SEPSIS - PHARMACY COMMUNICATION  **Broad-spectrum antimicrobials should be administered within one hour of sepsis diagnosis**  Time Code Sepsis call or page was received: 1154  Antibiotics ordered: Cefepime, Azithromycin  Time of first antibiotic administration: 1231  Additional action taken by pharmacy: N/A  If necessary, name of provider/nurse contacted: N/A    Will M. Dareen Piano, PharmD Clinical Pharmacist 02/13/2024 11:57 AM

## 2024-02-13 NOTE — Sepsis Progress Note (Signed)
 eLink is following this Code Sepsis.

## 2024-02-13 NOTE — ED Notes (Signed)
 Provided patient with graham crackers and soda upon request

## 2024-02-13 NOTE — Assessment & Plan Note (Addendum)
 02-13-2024 patient does not have sepsis.  Initial heart rate of 107 decrease rapidly to 78 within 1 hour of arrival to the ER.  She has no leukocytosis.  She has no fever.  She is not hypoxic.  Patient given IV Solu-Medrol in the ER.  EDP told the patient she would stay in the hospital overnight.  Switch her over to p.o. prednisone.  Start p.o. Duricef and p.o. Zithromax.  Check procalcitonin. Check covid/RVP. Patient does not have a nebulizer machine.  Order home that machine and to see consult to obtain his machine for her.  Counseled the patient she needs to absolutely stop smoking.  She will likely be stable in the morning for discharge.

## 2024-02-13 NOTE — H&P (Addendum)
 History and Physical    Deanna Ramsey:[id number] DOB: [birth year] DOA: 02/13/2024  DOS: the patient was seen and examined on 02/13/2024  PCP: Oswaldo Conroy, MD   Patient coming from: Home  I have personally briefly reviewed patient's old medical records in [facility]  HPI: 58 year old female prior history of breast cancer now in remission, COPD, chronic tobacco abuser presents the ER today with continued respiratory symptoms.  She has been to urgent care 2 times the last month.  Last time was about 2 weeks ago.  She was prescribed Zithromax and a 10-day course of prednisone.  She finished prednisone about 8 days ago.  She has had a persistent cough.  No fever.  Cough is somewhat productive.  No nausea.  Had diarrhea today.  In the ER due to persistent cough.  On arrival to the ER, temp 97.7 heart rate 104 blood pressure 133/92.  Within 1 hour after triage in the ER, heart rates down to 78.  Remains on room air.  White count 9.1, hemoglobin 12.5, platelets of 617  Sodium 138, potassium 3.9, chloride 106, bicarb 24, BUN of 16, creatinine 0.79, glucose 109  Total protein 7.1, albumin 2.7, AST 18, ALT 14, alk phos of 81, total bili 0.7  BNP normal at 57.6  Lactic acid normal at 1.7  TSH is 0.8, free T4 normal at 1.04  Initial chest x-ray showed some patchy nodular airspace disease right mid and right lower lung.  CTA PE study showed no PE.  She has got multifocal disease in the right lung.  CT abdomen pelvis showed new acute findings in the abdomen.  Significant Events: Admitted 02/13/2024 for multifocal pneumonia   Significant Labs: White count 9.1, hemoglobin 12.5, platelets of 617 Sodium 138, potassium 3.9, chloride 106, bicarb 24, BUN of 16, creatinine 0.79, glucose 109 Total protein 7.1, albumin 2.7, AST 18, ALT 14, alk phos of 81, total bili 0.7 BNP normal at 57.6 Lactic acid normal at 1.7 TSH is 0.8, free T4 normal at 1.04  Significant Imaging  Studies: CTPA/CT abd No CT evidence for acute pulmonary embolus. 2. Multiple areas of nodular/confluent airspace disease in the right lung with scattered areas of tree-in-bud nodularity in both lungs. Imaging features are most likely related to multifocal pneumonia with atypical etiology a distinct consideration. Given the nodular appearance some of this disease in the right lung, follow-up CT chest in 3 months after therapy recommended to ensure resolution. 3. 1.5 cm right thyroid nodule. Recommend thyroid US (ref: J Am Coll Radiol. 2015 Feb;12(2): 143-50). 4. No acute findings in the abdomen or pelvis. Specifically, no findings to account for the patient's history of abdominal pain. 5. Colonic diverticulosis without diverticulitis. Bilateral cortical scarring with bilateral renal cysts. 6. Tiny nonobstructing bilateral renal stones. 7.  Emphysema  Antibiotic Therapy: Anti-infectives (From admission, onward)    Start     Dose/Rate Route Frequency Ordered Stop   02/14/24 1000  azithromycin (ZITHROMAX) tablet 500 mg        500 mg Oral Daily 02/13/24 1359     02/14/24 1000  cefadroxil (DURICEF) capsule 500 mg        500 mg Oral 2 times daily 02/13/24 1359     02/13/24 1200  ceFEPIme (MAXIPIME) 2 g in sodium chloride 0.9 % 100 mL IVPB        2 g 200 mL/hr over 30 Minutes Intravenous  Once 02/13/24 1154 02/13/24 1310   02/13/24 1200  azithromycin (ZITHROMAX) 500  mg in sodium chloride 0.9 % 250 mL IVPB  Status:  Discontinued        500 mg 250 mL/hr over 60 Minutes Intravenous Every 24 hours 02/13/24 1154 02/13/24 1358       Procedures:   Consultants:    ED Course: Initially tachycardic to 107 on arrival but quickly decreased to 78 bpm within 1 hour.  No fever.  No leukocytosis.  No hypoxia.  Started on IV antibiotics.  Given IV Solu-Medrol.  Review of Systems:  Review of Systems  Constitutional:  Positive for malaise/fatigue. Negative for chills and fever.  HENT:  Negative for hearing  loss.   Eyes: Negative.   Respiratory:  Positive for cough, sputum production and shortness of breath.   Cardiovascular: Negative.   Gastrointestinal:  Positive for diarrhea. Negative for nausea and vomiting.  Genitourinary: Negative.   Musculoskeletal: Negative.   Skin: Negative.   Neurological: Negative.   Endo/Heme/Allergies: Negative.   Psychiatric/Behavioral: Negative.    All other systems reviewed and are negative.   Past Medical History:  Diagnosis Date   Arthritis    Asthmatic bronchitis , chronic (HCC) 03/07/2022   Breast cancer (HCC) 01/2020   left breast triple neagative IMC, HG DCIS   Cellulitis    COPD (chronic obstructive pulmonary disease) (HCC)    Depression    History of kidney stones    History of tracheostomy 2000   stated that epiglottis swelled up for an unknown reason; had trach for about a week.    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   Pneumonia     Past Surgical History:  Procedure Laterality Date   arm surgery  1984   fractured ulna   BREAST BIOPSY Left 01/2020   IMC 3:00 10cmfn [place] bx   BREAST LUMPECTOMY Left 02/04/2020   triple negative, IMC and HG DCIS with positive margins,  6 LN's negative    BREAST LUMPECTOMY Left 02/24/2020   re excision for margins   BREAST LUMPECTOMY WITH SENTINEL LYMPH NODE BIOPSY Left 02/04/2020   Procedure: BREAST LUMPECTOMY WITH SENTINEL LYMPH NODE BX;  Surgeon: Leafy Ro, MD;  Location: [facility] ORS;  Service: General;  Laterality: Left;   CLEFT LIP REPAIR     FRACTURE SURGERY     kidney stones     lymphnode neck     PORTACATH PLACEMENT N/A 02/04/2020   Procedure: INSERTION PORT-A-CATH;  Surgeon: Leafy Ro, MD;  Location: [facility] ORS;  Service: General;  Laterality: N/A;   RE-EXCISION OF BREAST LUMPECTOMY Left 02/24/2020   Procedure: RE-EXCISION OF Left BREAST  LUMPECTOMY;  Surgeon: Leafy Ro, MD;  Location: [facility] ORS;  Service: General;  Laterality: Left;   TYMPANOSTOMY TUBE PLACEMENT       reports that she has been smoking cigarettes. She has a 80 pack-year smoking history. She has never used smokeless tobacco. She reports that she does not drink alcohol and does not use drugs.  Allergies  Allergen Reactions   Sulfa Antibiotics Nausea And Vomiting    Family History  Problem Relation Age of Onset   COPD Other    COPD Mother    Hypertension Mother    COPD Father    Breast cancer Neg Hx     Prior to Admission medications   Medication Sig Start Date End Date Taking?  Authorizing Provider  acetaminophen (TYLENOL) 325 MG tablet Take 650 mg by mouth every 6 (six) hours as needed for moderate pain.    [provider]  alendronate (FOSAMAX) 70 MG tablet TAKE 1 TABLET(70 MG) BY MOUTH 1 TIME A WEEK WITH A FULL GLASS OF WATER AND ON AN EMPTY STOMACH 10/02/23   Jeralyn Ruths, MD  budesonide-formoterol (SYMBICORT) 80-4.5 MCG/ACT inhaler Take 2 puffs first thing in am and then another 2 puffs about 12 hours later. 03/07/22   Nyoka Cowden, MD  prochlorperazine (COMPAZINE) 10 MG tablet Take 1 tablet (10 mg total) by mouth every 6 (six) hours as needed (Nausea or vomiting). 11/07/22   Jeralyn Ruths, MD    Physical Exam: Vitals:   02/13/24 1130 02/13/24 1200 02/13/24 1230 02/13/24 1400  BP: 126/68 136/84 119/88   Pulse: 70 60 76   Resp: 20 17 19    Temp:    98.7 F (37.1 C)  TempSrc:    Oral  SpO2: 93% 93% 96%   Weight:      Height:        Physical Exam Vitals and nursing note reviewed.  Constitutional:      General: She is not in acute distress.    Appearance: She is not toxic-appearing or diaphoretic.  HENT:     Head: Normocephalic and atraumatic.     Nose: Nose normal.  Cardiovascular:     Rate and Rhythm: Normal rate and regular rhythm.  Pulmonary:     Effort: Pulmonary effort is normal.     Breath sounds:  Rhonchi present. No wheezing or rales.  Abdominal:     General: Bowel sounds are normal. There is no distension.     Palpations: Abdomen is soft.     Tenderness: There is no abdominal tenderness. There is no guarding or rebound.  Musculoskeletal:     Right lower leg: No edema.     Left lower leg: No edema.  Skin:    General: Skin is warm and dry.     Capillary Refill: Capillary refill takes less than 2 seconds.  Neurological:     General: No focal deficit present.     Mental Status: She is alert and oriented to person, place, and time.      Labs on Admission: I have personally reviewed following labs and imaging studies  CBC: Recent Labs  Lab 02/13/24 0835  WBC 9.1  HGB 12.5  HCT 38.3  MCV 94.8  PLT 617*   Basic Metabolic Panel: Recent Labs  Lab 02/13/24 0846  NA 138  K 3.9  CL 106  CO2 24  GLUCOSE 109*  BUN 16  CREATININE 0.75  CALCIUM 9.1   GFR: Estimated Creatinine Clearance: 62.2 mL/min (by C-G formula based on SCr of 0.75 mg/dL). Liver Function Tests: Recent Labs  Lab 02/13/24 0846  AST 18  ALT 14  ALKPHOS 81  BILITOT 0.7  PROT 7.1  ALBUMIN 2.7*   Cardiac Enzymes: Recent Labs  Lab 02/13/24 0846 02/13/24 1100  TROPONINIHS 7 6   BNP (last 3 results) Recent Labs    02/13/24 0846  BNP 57.6   Thyroid Function Tests: Recent Labs    02/13/24 1100  TSH 0.800  FREET4 1.04   Radiological Exams on Admission: I have personally reviewed images CT Angio Chest PE W/Cm &/Or Wo Cm Result Date: 02/13/2024 CLINICAL DATA:  Shortness of breath.  Nonlocalized abdominal pain. EXAM: CT ANGIOGRAPHY CHEST CT ABDOMEN AND PELVIS WITH CONTRAST TECHNIQUE: Multidetector  CT imaging of the chest was performed using the standard protocol during bolus administration of intravenous contrast. Multiplanar CT image reconstructions and MIPs were obtained to evaluate the vascular anatomy. Multidetector CT imaging of the abdomen and pelvis was performed using the standard  protocol during bolus administration of intravenous contrast. RADIATION DOSE REDUCTION: This exam was performed according to the departmental dose-optimization program which includes automated exposure control, adjustment of the mA and/or kV according to patient size and/or use of iterative reconstruction technique. CONTRAST:  75mL OMNIPAQUE IOHEXOL 350 MG/ML SOLN COMPARISON:  Chest CTA 12/08/2021.  Abdomen and pelvis CT 09/03/2014 FINDINGS: CTA CHEST FINDINGS Cardiovascular: The heart size is normal. No substantial pericardial effusion. Coronary artery calcification is evident. Mild atherosclerotic calcification is noted in the wall of the thoracic aorta. There is no filling defect within the opacified pulmonary arteries to suggest the presence of an acute pulmonary embolus. Mediastinum/Nodes: 1.5 cm right thyroid nodule evident. Scattered upper normal mediastinal lymph nodes evident. There is no hilar lymphadenopathy. The esophagus has normal imaging features. There is no axillary lymphadenopathy. Lungs/Pleura: Centrilobular and paraseptal emphysema evident. Stable pleuroparenchymal scarring in the right apex (21/5). Clustered nodularity seen right middle lobe on image 89/5 with tree-in-bud opacity noted posterior right upper lobe on image 57/5. Dominant nodular component in this region is 8 mm on 57/5. Confluent masslike airspace opacity in the paraspinal right lower lobe measures 4.2 x 2.4 cm with adjacent tree-in-bud nodularity and another nodular component measuring 18 mm on 105/5. Similar areas of tree-in-bud nodularity seen in the peripheral left lower lobe. 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 No pleural effusion on today's exam. Musculoskeletal: No worrisome lytic or sclerotic osseous abnormality. Review of the MIP images confirms the above findings. CT ABDOMEN and PELVIS FINDINGS Hepatobiliary: No suspicious focal abnormality within the liver  parenchyma. There is no evidence for gallstones, gallbladder wall thickening, or pericholecystic fluid. No intrahepatic or extrahepatic biliary dilation. Pancreas: No focal mass lesion. No dilatation of the main duct. No intraparenchymal cyst. No peripancreatic edema. Spleen: No splenomegaly. No suspicious focal mass lesion. Adrenals/Urinary Tract: Right adrenal gland unremarkable. 14 mm left adrenal nodule was 13 mm on previous CT from 2015 consistent with benign etiology such as adenoma. No followup imaging is recommended. Washout characteristics today are also compatible with adenoma. Multiple areas of cortical scarring are seen in the right kidney with 2-3 mm nonobstructing stone in the lower pole. Several simple right renal cysts noted. No followup imaging is recommended. Cortical scarring noted in the left kidney as well with 2-3 mm nonobstructing stone in the lower pole region. 3.3 cm simple cyst noted upper pole region with 3.0 cm simple cyst lower interpolar region. Additional tiny hypodensities in the left kidney are too small to characterize but statistically most likely benign. No followup imaging is recommended. Stomach/Bowel: Stomach is unremarkable. No gastric wall thickening. No evidence of outlet obstruction. Duodenum is normally positioned as is the ligament of Treitz. No small bowel wall thickening. No small bowel dilatation. The terminal ileum is normal. The appendix is normal. No gross colonic mass. No colonic wall thickening. Diverticular changes are noted in the left colon without evidence of diverticulitis. Vascular/Lymphatic: There is advanced atherosclerotic calcification of the abdominal aorta without aneurysm. There is no gastrohepatic or hepatoduodenal ligament lymphadenopathy. No retroperitoneal or mesenteric lymphadenopathy. No pelvic sidewall lymphadenopathy. Reproductive: The uterus is unremarkable.  There is no adnexal mass. Other: No intraperitoneal free fluid. Musculoskeletal: No  worrisome lytic or  sclerotic osseous abnormality. Review of the MIP images confirms the above findings. IMPRESSION: 1. No CT evidence for acute pulmonary embolus. 2. Multiple areas of nodular/confluent airspace disease in the right lung with scattered areas of tree-in-bud nodularity in both lungs. Imaging features are most likely related to multifocal pneumonia with atypical etiology a distinct consideration. Given the nodular appearance some of this disease in the right lung, follow-up CT chest in 3 months after therapy recommended to ensure resolution. 3. 1.5 cm right thyroid nodule. Recommend thyroid US (ref: J Am Coll Radiol. 2015 Feb;12(2): 143-50). 4. No acute findings in the abdomen or pelvis. Specifically, no findings to account for the patient's history of abdominal pain. 5. Colonic diverticulosis without diverticulitis. Bilateral cortical scarring with bilateral renal cysts. 6. Tiny nonobstructing bilateral renal stones. 7.  Emphysema (ICD10-J43.9) and Aortic Atherosclerosis (ICD10-170.0) Electronically Signed   By: Kennith Center M.D.   On: 02/13/2024 11:47   CT ABDOMEN PELVIS W CONTRAST Result Date: 02/13/2024 CLINICAL DATA:  Shortness of breath.  Nonlocalized abdominal pain. EXAM: CT ANGIOGRAPHY CHEST CT ABDOMEN AND PELVIS WITH CONTRAST TECHNIQUE: Multidetector CT imaging of the chest was performed using the standard protocol during bolus administration of intravenous contrast. Multiplanar CT image reconstructions and MIPs were obtained to evaluate the vascular anatomy. Multidetector CT imaging of the abdomen and pelvis was performed using the standard protocol during bolus administration of intravenous contrast. RADIATION DOSE REDUCTION: This exam was performed according to the departmental dose-optimization program which includes automated exposure control, adjustment of the mA and/or kV according to patient size and/or use of iterative reconstruction technique. CONTRAST:  75mL OMNIPAQUE IOHEXOL 350  MG/ML SOLN COMPARISON:  Chest CTA 12/08/2021.  Abdomen and pelvis CT 09/03/2014 FINDINGS: CTA CHEST FINDINGS Cardiovascular: The heart size is normal. No substantial pericardial effusion. Coronary artery calcification is evident. Mild atherosclerotic calcification is noted in the wall of the thoracic aorta. There is no filling defect within the opacified pulmonary arteries to suggest the presence of an acute pulmonary embolus. Mediastinum/Nodes: 1.5 cm right thyroid nodule evident. Scattered upper normal mediastinal lymph nodes evident. There is no hilar lymphadenopathy. The esophagus has normal imaging features. There is no axillary lymphadenopathy. Lungs/Pleura: Centrilobular and paraseptal emphysema evident. Stable pleuroparenchymal scarring in the right apex (21/5). Clustered nodularity seen right middle lobe on image 89/5 with tree-in-bud opacity noted posterior right upper lobe on image 57/5. Dominant nodular component in this region is 8 mm on 57/5. Confluent masslike airspace opacity in the paraspinal right lower lobe measures 4.2 x 2.4 cm with adjacent tree-in-bud nodularity and another nodular component measuring 18 mm on 105/5. Similar areas of tree-in-bud nodularity seen in the peripheral left lower lobe. 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 No pleural effusion on today's exam. Musculoskeletal: No worrisome lytic or sclerotic osseous abnormality. Review of the MIP images confirms the above findings. CT ABDOMEN and PELVIS FINDINGS Hepatobiliary: No suspicious focal abnormality within the liver parenchyma. There is no evidence for gallstones, gallbladder wall thickening, or pericholecystic fluid. No intrahepatic or extrahepatic biliary dilation. Pancreas: No focal mass lesion. No dilatation of the main duct. No intraparenchymal cyst. No peripancreatic edema. Spleen: No splenomegaly. No suspicious focal mass lesion. Adrenals/Urinary Tract: Right adrenal  gland unremarkable. 14 mm left adrenal nodule was 13 mm on previous CT from 2015 consistent with benign etiology such as adenoma. No followup imaging is recommended. Washout characteristics today are also compatible with adenoma. Multiple areas of cortical scarring are seen in the right  kidney with 2-3 mm nonobstructing stone in the lower pole. Several simple right renal cysts noted. No followup imaging is recommended. Cortical scarring noted in the left kidney as well with 2-3 mm nonobstructing stone in the lower pole region. 3.3 cm simple cyst noted upper pole region with 3.0 cm simple cyst lower interpolar region. Additional tiny hypodensities in the left kidney are too small to characterize but statistically most likely benign. No followup imaging is recommended. Stomach/Bowel: Stomach is unremarkable. No gastric wall thickening. No evidence of outlet obstruction. Duodenum is normally positioned as is the ligament of Treitz. No small bowel wall thickening. No small bowel dilatation. The terminal ileum is normal. The appendix is normal. No gross colonic mass. No colonic wall thickening. Diverticular changes are noted in the left colon without evidence of diverticulitis. Vascular/Lymphatic: There is advanced atherosclerotic calcification of the abdominal aorta without aneurysm. There is no gastrohepatic or hepatoduodenal ligament lymphadenopathy. No retroperitoneal or mesenteric lymphadenopathy. No pelvic sidewall lymphadenopathy. Reproductive: The uterus is unremarkable.  There is no adnexal mass. Other: No intraperitoneal free fluid. Musculoskeletal: No worrisome lytic or sclerotic osseous abnormality. Review of the MIP images confirms the above findings. IMPRESSION: 1. No CT evidence for acute pulmonary embolus. 2. Multiple areas of nodular/confluent airspace disease in the right lung with scattered areas of tree-in-bud nodularity in both lungs. Imaging features are most likely related to multifocal pneumonia  with atypical etiology a distinct consideration. Given the nodular appearance some of this disease in the right lung, follow-up CT chest in 3 months after therapy recommended to ensure resolution. 3. 1.5 cm right thyroid nodule. Recommend thyroid US (ref: J Am Coll Radiol. 2015 Feb;12(2): 143-50). 4. No acute findings in the abdomen or pelvis. Specifically, no findings to account for the patient's history of abdominal pain. 5. Colonic diverticulosis without diverticulitis. Bilateral cortical scarring with bilateral renal cysts. 6. Tiny nonobstructing bilateral renal stones. 7.  Emphysema (ICD10-J43.9) and Aortic Atherosclerosis (ICD10-170.0) Electronically Signed   By: Kennith Center M.D.   On: 02/13/2024 11:47   DG Chest 2 View Result Date: 02/13/2024 CLINICAL DATA:  Shortness of breath.  Upper respiratory infection. EXAM: CHEST - 2 VIEW COMPARISON:  01/25/2022 FINDINGS: Lungs are hyperexpanded. Patchy and nodular airspace disease is seen in the right mid and lower lung. Left lung clear. Interstitial markings are diffusely coarsened with chronic features. The cardiopericardial silhouette is within normal limits for size. No pleural effusion. No acute bony abnormality. IMPRESSION: Patchy and nodular airspace disease in the right mid and lower lung. Findings are likely infectious/inflammatory. Patient is scheduled for chest CTA to be performed during this visit and reader is referred to the report for that exam. Electronically Signed   By: Kennith Center M.D.   On: 02/13/2024 11:33    EKG: My personal interpretation of EKG shows: NSR    Assessment/Plan Principal Problem:   Multifocal pneumonia Active Problems:   COPD with acute exacerbation (HCC)   Cigarette smoker   Assessment and Plan: * Multifocal pneumonia 02-13-2024 patient does not have sepsis.  Initial heart rate of 107 decrease rapidly to 78 within 1 hour of arrival to the ER.  She has no leukocytosis.  She has no fever.  She is not hypoxic.   Patient given IV Solu-Medrol in the ER.  EDP told the patient she would stay in the hospital overnight.  Switch her over to p.o. prednisone.  Start p.o. Duricef and p.o. Zithromax.  Check procalcitonin. Check covid/RVP. Patient does not have a nebulizer  machine.  Order home that machine and to see consult to obtain his machine for her.  Counseled the patient she needs to absolutely stop smoking.  She will likely be stable in the morning for discharge.  COPD with acute exacerbation (HCC) 02-13-2024 Given 125 of Solu-Medrol in the ER.  Continue 40 mg of prednisone.  She needs to stop smoking.  Continue nebs 4 times a day.  Home the machine ordered.  TOC consult placed.  Cigarette smoker 02-13-2024 patient advised she needs to absolutely quit smoking.   DVT prophylaxis: SQ Heparin Code Status: Full Code Family Communication: no family at bedside. She is decisional  Disposition Plan: return home  Consults called: none  Admission status: Observation, Med-Surg   Carollee Herter, DO [facility] 02/13/2024, 2:16 PM

## 2024-02-13 NOTE — Assessment & Plan Note (Signed)
 02-13-2024 Given 125 of Solu-Medrol in the ER.  Continue 40 mg of prednisone.  She needs to stop smoking.  Continue nebs 4 times a day.  Home the machine ordered.  TOC consult placed.

## 2024-02-13 NOTE — ED Triage Notes (Signed)
 Pt to ED for shob and URI for 6 weeks. States has been to walk in clinic x2. Started antibiotics, z pack and prednisone. NAD noted. Cough present.  +smoker, COPD

## 2024-02-13 NOTE — ED Provider Notes (Signed)
 [facility] Provider Note    Event Date/Time   First MD Initiated Contact with Patient 02/13/24 [phone]     (approximate)   History   Shortness of Breath   HPI  Deanna Ramsey is a 58 y.o. female past medical history seen again for COPD, tobacco use, presents to the emergency department with shortness of breath and cough.  Patient states that over the past 6 weeks she has been seen twice and done a course of antibiotics with azithromycin and doxycycline and just completed a course of steroids approximately 1 and half weeks ago.  States that she has had ongoing symptoms with cough and shortness of breath.  Anytime she goes to stand up she feels like something is not right.  Denies any significant chest pain.  Endorses some chills but no fever.  Endorses nausea but no episodes of vomiting.  Diffuse abdominal discomfort.  States that she was told to take some Imodium/loperamide for diarrhea he was having.  He unexplained weight loss.  Denies any history of DVT or PE.  Remote history of breast cancer and has been in remission for multiple years.  Not on anticoagulation.  No recent travel or surgery     Physical Exam   Triage Vital Signs: ED Triage Vitals [02/13/24 0833]  Encounter Vitals Group     BP (!) 133/92     Systolic BP Percentile      Diastolic BP Percentile      Pulse Rate (!) 104     Resp 20     Temp 97.7 F (36.5 C)     Temp src      SpO2 93 %     Weight 112 lb (50.8 kg)     Height 5\' 5"  (1.651 m)     Head Circumference      Peak Flow      Pain Score 0     Pain Loc      Pain Education      Exclude from Growth Chart     Most recent vital signs: Vitals:   02/13/24 1000 02/13/24 1100  BP: (!) 153/75 (!) 168/85  Pulse: 78 88  Resp: (!) 21 (!) 21  Temp:    SpO2: 98% 94%    Physical Exam Constitutional:      Appearance: She is well-developed.  HENT:     Head: Atraumatic.  Eyes:     Conjunctiva/sclera: Conjunctivae normal.   Cardiovascular:     Rate and Rhythm: Regular rhythm. Tachycardia present.  Pulmonary:     Effort: Tachypnea present. No respiratory distress.     Breath sounds: Wheezing present.  Chest:     Chest wall: No tenderness.  Abdominal:     General: There is no distension.     Palpations: Abdomen is soft.     Tenderness: There is no abdominal tenderness.  Musculoskeletal:        General: Normal range of motion.     Cervical back: Normal range of motion.     Right lower leg: No edema.     Left lower leg: No edema.  Skin:    General: Skin is warm.     Capillary Refill: Capillary refill takes less than 2 seconds.  Neurological:     Mental Status: She is alert. Mental status is at baseline.     IMPRESSION / MDM / ASSESSMENT AND PLAN / ED COURSE  I reviewed the triage vital signs and the nursing notes.  Differential diagnosis including COPD exacerbation, pneumonia, pulmonary embolism, dehydration, anemia, intra-abdominal infection  EKG  I, Corena Herter, the attending physician, personally viewed and interpreted this ECG.   Rate: Normal  Rhythm: Normal sinus  Axis: Normal  Intervals: Normal  ST&T Change: None  Sinus tachycardia while on cardiac telemetry.  RADIOLOGY I independently reviewed imaging, my interpretation of imaging: Chest x-ray -concern for multifocal pneumonia  LABS (all labs ordered are listed, but only abnormal results are displayed) Labs interpreted as -    Labs Reviewed  CBC - Abnormal; Notable for the following components:      Result Value   Platelets 617 (*)    All other components within normal limits  COMPREHENSIVE METABOLIC PANEL - Abnormal; Notable for the following components:   Glucose, Bld 109 (*)    Albumin 2.7 (*)    All other components within normal limits  BLOOD GAS, VENOUS - Abnormal; Notable for the following components:   pCO2, Ven 43 (*)    pO2, Ven <31 (*)    All other components within normal limits  CULTURE, BLOOD (ROUTINE X  2)  CULTURE, BLOOD (ROUTINE X 2)  CULTURE, BLOOD (SINGLE)  BRAIN NATRIURETIC PEPTIDE  D-DIMER, QUANTITATIVE  LACTIC ACID, PLASMA  LACTIC ACID, PLASMA  TROPONIN I (HIGH SENSITIVITY)  TROPONIN I (HIGH SENSITIVITY)     MDM    Patient with ongoing cough and shortness of breath over the past couple of weeks.  On chart review I do not see that the patient has had a chest x-ray done that has completed antibiotics.  On my evaluation last saw pulmonology approximately 2 years ago.  Plan for DuoNeb treatment, IV Solu-Medrol treat possible COPD exacerbation.  Plan for chest x-ray and lab work.  Will send a screening D-dimer given low risk Wells criteria to further risk ratified for pulmonary embolism.  CTA ordered to further evaluate for possible pulmonary embolism.  On my evaluation no obvious pulmonary embolism that central.  Does appear to have a multifocal pneumonia.  Read as no acute PE but does have findings concerning for possible multifocal pneumonia.  Also noted incidental finding of thyroid nodule.  Given that the patient has already completed the entire course of antibiotics with azithromycin and doxycycline concerned that she has got multifocal pneumonia with Pseudomonas risk factors given history of COPD, started on IV cefepime and Zithromycin.  Consulted hospitalist for multifocal pneumonia that has failed outpatient management.   PROCEDURES:  Critical Care performed: No  Procedures  Patient's presentation is most consistent with acute presentation with potential threat to life or bodily function.   MEDICATIONS ORDERED IN ED: Medications  lactated ringers infusion (has no administration in time range)  ceFEPIme (MAXIPIME) 2 g in sodium chloride 0.9 % 100 mL IVPB (has no administration in time range)  azithromycin (ZITHROMAX) 500 mg in sodium chloride 0.9 % 250 mL IVPB (has no administration in time range)  ipratropium-albuterol (DUONEB) 0.5-2.5 (3) MG/3ML nebulizer solution 6  mL (6 mLs Nebulization Given 02/13/24 0913)  methylPREDNISolone sodium succinate (SOLU-MEDROL) 125 mg/2 mL injection 125 mg (125 mg Intravenous Given 02/13/24 0913)  iohexol (OMNIPAQUE) 350 MG/ML injection 75 mL (75 mLs Intravenous Contrast Given 02/13/24 1041)    FINAL CLINICAL IMPRESSION(S) / ED DIAGNOSES   Final diagnoses:  COPD exacerbation (HCC)  SOB (shortness of breath)  Multifocal pneumonia     Rx / DC Orders   ED Discharge Orders     None        Note:  This document was prepared using Dragon voice recognition software and may include unintentional dictation errors.   Corena Herter, MD 02/13/24 [phone]

## 2024-02-13 NOTE — Hospital Course (Addendum)
 Hospital course / significant events:   HPI: 58 year old female prior history of breast cancer now in remission, COPD, chronic tobacco abuser presents the ER today with continued respiratory symptoms.  She has been to urgent care 2 times the last month.  Last time was about 2 weeks ago.  She was prescribed Zithromax and a 10-day course of prednisone.  She finished prednisone about 8 days ago.  She has had a persistent cough.  No fever.  Cough is somewhat productive.  No nausea.  Had diarrhea today.  In the ER due to persistent cough.  03/08: to ED, CTA PE study showed no PE.  She has got multifocal disease in the right lung. Admitted to hospitalist service  03/09: ***     Consultants:  none  Procedures/Surgeries: none      ASSESSMENT & PLAN:   Multifocal pneumonia Strep pneumo urinary antigen positive Procalcitonin less than 0.10 Still coughing up yellow phlegm Continue cefadroxil, azithromycin   COPD with acute exacerbation  Continue prednisone 40 mg daily DuoNebs 4 times daily hypertonic saline twice a day Mucinex 1200 mg daily flutter valve every 4 hours   Chronic tobacco use 02-13-2024 patient advised she needs to absolutely quit smoking.   Thyroid nodule,  *** ultrasound 1.5 cm thyroid nodule seen on CT chest Will obtain thyroid ultrasound   History of breast cancer status postmastectomy Stable    Borderline underweight based on BMI: Body mass index is 18.64 kg/m.  Underweight - under 18  overweight - 25 to 29 obese - 30 or more Class 1 obesity: BMI of 30.0 to 34 Class 2 obesity: BMI of 35.0 to 39 Class 3 obesity: BMI of 40.0 to 49 Super Morbid Obesity: BMI 50-59 Super-super Morbid Obesity: BMI 60+ Significantly low or high BMI is associated with higher medical risk.  Weight management advised as adjunct to other disease management and risk reduction treatments    DVT prophylaxis: *** IV fluids: *** continuous IV fluids  Nutrition: *** Central  lines / invasive devices: ***  Code Status: *** ACP documentation reviewed: *** none on file in [facility]  TOC needs: *** Barriers to dispo / significant pending items: ***

## 2024-02-14 ENCOUNTER — Observation Stay

## 2024-02-14 DIAGNOSIS — J189 Pneumonia, unspecified organism: Secondary | ICD-10-CM | POA: Diagnosis present

## 2024-02-14 DIAGNOSIS — Z6841 Body Mass Index (BMI) 40.0 and over, adult: Secondary | ICD-10-CM | POA: Diagnosis not present

## 2024-02-14 DIAGNOSIS — R636 Underweight: Secondary | ICD-10-CM | POA: Diagnosis present

## 2024-02-14 DIAGNOSIS — E041 Nontoxic single thyroid nodule: Secondary | ICD-10-CM | POA: Diagnosis present

## 2024-02-14 DIAGNOSIS — J440 Chronic obstructive pulmonary disease with acute lower respiratory infection: Secondary | ICD-10-CM | POA: Diagnosis present

## 2024-02-14 DIAGNOSIS — Z8249 Family history of ischemic heart disease and other diseases of the circulatory system: Secondary | ICD-10-CM | POA: Diagnosis not present

## 2024-02-14 DIAGNOSIS — J439 Emphysema, unspecified: Secondary | ICD-10-CM | POA: Diagnosis present

## 2024-02-14 DIAGNOSIS — B953 Streptococcus pneumoniae as the cause of diseases classified elsewhere: Secondary | ICD-10-CM | POA: Diagnosis present

## 2024-02-14 DIAGNOSIS — Z681 Body mass index (BMI) 19 or less, adult: Secondary | ICD-10-CM | POA: Diagnosis not present

## 2024-02-14 DIAGNOSIS — Z825 Family history of asthma and other chronic lower respiratory diseases: Secondary | ICD-10-CM | POA: Diagnosis not present

## 2024-02-14 DIAGNOSIS — Z7951 Long term (current) use of inhaled steroids: Secondary | ICD-10-CM | POA: Diagnosis not present

## 2024-02-14 DIAGNOSIS — F17210 Nicotine dependence, cigarettes, uncomplicated: Secondary | ICD-10-CM | POA: Diagnosis present

## 2024-02-14 DIAGNOSIS — Z9221 Personal history of antineoplastic chemotherapy: Secondary | ICD-10-CM | POA: Diagnosis not present

## 2024-02-14 DIAGNOSIS — Z7983 Long term (current) use of bisphosphonates: Secondary | ICD-10-CM | POA: Diagnosis not present

## 2024-02-14 DIAGNOSIS — R197 Diarrhea, unspecified: Secondary | ICD-10-CM | POA: Diagnosis present

## 2024-02-14 DIAGNOSIS — I10 Essential (primary) hypertension: Secondary | ICD-10-CM | POA: Diagnosis present

## 2024-02-14 DIAGNOSIS — Z853 Personal history of malignant neoplasm of breast: Secondary | ICD-10-CM | POA: Diagnosis not present

## 2024-02-14 DIAGNOSIS — J441 Chronic obstructive pulmonary disease with (acute) exacerbation: Secondary | ICD-10-CM | POA: Diagnosis present

## 2024-02-14 DIAGNOSIS — Z923 Personal history of irradiation: Secondary | ICD-10-CM | POA: Diagnosis not present

## 2024-02-14 DIAGNOSIS — Z1152 Encounter for screening for COVID-19: Secondary | ICD-10-CM | POA: Diagnosis not present

## 2024-02-14 DIAGNOSIS — Z23 Encounter for immunization: Secondary | ICD-10-CM | POA: Diagnosis not present

## 2024-02-14 DIAGNOSIS — Z882 Allergy status to sulfonamides status: Secondary | ICD-10-CM | POA: Diagnosis not present

## 2024-02-14 DIAGNOSIS — Z87442 Personal history of urinary calculi: Secondary | ICD-10-CM | POA: Diagnosis not present

## 2024-02-14 LAB — HIV ANTIBODY (ROUTINE TESTING W REFLEX): HIV Screen 4th Generation wRfx: NONREACTIVE

## 2024-02-14 LAB — BLOOD GAS, VENOUS
Acid-Base Excess: 1.5 mmol/L (ref 0.0–2.0)
Bicarbonate: 26.6 mmol/L (ref 20.0–28.0)
O2 Saturation: 49.6 %
Patient temperature: 37
pCO2, Ven: 43 mmHg — ABNORMAL LOW (ref 44–60)
pH, Ven: 7.4 (ref 7.25–7.43)

## 2024-02-14 MED ORDER — SODIUM CHLORIDE 3 % IN NEBU
4.0000 mL | INHALATION_SOLUTION | Freq: Two times a day (BID) | RESPIRATORY_TRACT | Status: DC
  Administered 2024-02-14 – 2024-02-15 (×3): 4 mL via RESPIRATORY_TRACT
  Filled 2024-02-14 (×4): qty 4

## 2024-02-14 MED ORDER — GUAIFENESIN ER 600 MG PO TB12
1200.0000 mg | ORAL_TABLET | Freq: Two times a day (BID) | ORAL | Status: DC
  Administered 2024-02-14 – 2024-02-15 (×3): 1200 mg via ORAL
  Filled 2024-02-14 (×3): qty 2

## 2024-02-14 MED ORDER — KETOROLAC TROMETHAMINE 30 MG/ML IJ SOLN
30.0000 mg | Freq: Once | INTRAMUSCULAR | Status: AC
  Administered 2024-02-14: 30 mg via INTRAVENOUS
  Filled 2024-02-14: qty 1

## 2024-02-14 MED ORDER — NICOTINE 21 MG/24HR TD PT24
21.0000 mg | MEDICATED_PATCH | Freq: Every day | TRANSDERMAL | Status: DC
  Administered 2024-02-14 – 2024-02-15 (×2): 21 mg via TRANSDERMAL
  Filled 2024-02-14 (×2): qty 1

## 2024-02-14 MED ORDER — IPRATROPIUM-ALBUTEROL 0.5-2.5 (3) MG/3ML IN SOLN
3.0000 mL | Freq: Three times a day (TID) | RESPIRATORY_TRACT | Status: DC
  Administered 2024-02-14 – 2024-02-15 (×3): 3 mL via RESPIRATORY_TRACT
  Filled 2024-02-14 (×3): qty 3

## 2024-02-14 NOTE — TOC Initial Note (Signed)
 Transition of Care [facility]) - Initial/Assessment Note    Patient Details  Name: Deanna Ramsey MRN: [id number] Date of Birth: 17-Nov-1966  [facility] CM/SW Contact:    Rodney Langton, RN Phone Number: 02/14/2024, 11:09 AM  Clinical Narrative:                  Patient admitted from home with roommate, independent, has additional family support.  PCP is Dr. Hessie Diener, but report wanting Dr. Orlie Dakin to be contacted for needs.  Obtains medications from [facility] on [address].  Aware of order for Nebulizer machine, does not have preference of agency.  Ada with [employer] made aware and will deliver prior to discharge.   Expected Discharge Plan: Home/Self Care Barriers to Discharge: Continued Medical Work up   Patient Goals and CMS Choice            Expected Discharge Plan and Services       Living arrangements for the past 2 months: Single Family Home                 DME Arranged: Nebulizer machine DME Agency: [employer] Date DME Agency Contacted: 02/14/24 Time DME Agency Contacted: 1108 Representative spoke with at DME Agency: Ada            Prior Living Arrangements/Services Living arrangements for the past 2 months: Single Family Home Lives with:: Roommate Patient language and need for interpreter reviewed:: Yes Do you feel safe going back to the place where you live?: Yes      Need for Family Participation in Patient Care: Yes (Comment) Care giver support system in place?: Yes (comment)   Criminal Activity/Legal Involvement Pertinent to Current Situation/Hospitalization: No - Comment as needed  Activities of Daily Living   ADL Screening (condition at time of admission) Independently performs ADLs?: Yes (appropriate for developmental age) Is the patient deaf or have difficulty hearing?: No Does the patient have difficulty seeing, even when wearing glasses/contacts?: No Does the patient have difficulty concentrating, remembering, or making  decisions?: No  Permission Sought/Granted   Permission granted to share information with : Yes, Verbal Permission Granted              Emotional Assessment       Orientation: : Oriented to Self, Oriented to Situation, Oriented to  Time, Oriented to Place   Psych Involvement: No (comment)  Admission diagnosis:  Thyroid nodule [E04.1] SOB (shortness of breath) [R06.02] COPD exacerbation (HCC) [J44.1] Multifocal pneumonia [J18.9] Patient Active Problem List   Diagnosis Date Noted   Multifocal pneumonia 02/13/2024   COPD with acute exacerbation (HCC) 02/13/2024   Asthmatic bronchitis , chronic (HCC) 03/07/2022   Cigarette smoker 03/07/2022   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 [place] [address] Phone: [phone] Fax: [phone]     Social Drivers of Health (SDOH) Social History: SDOH Screenings   Food Insecurity: No Food Insecurity (02/13/2024)  Housing: Low Risk  (02/13/2024)  Transportation Needs: No Transportation Needs (02/13/2024)  Utilities: At Risk (02/13/2024)  Tobacco Use: High Risk (02/13/2024)   SDOH Interventions:     Readmission Risk Interventions     No data to display

## 2024-02-14 NOTE — Progress Notes (Signed)
 [facility]  PROGRESS NOTE  Deanna Ramsey WUJ:[id number] DOB: 12-04-1966 DOA: 02/13/2024 PCP: Oswaldo Conroy, MD   Brief HPI:    58 year old female prior history of breast cancer now in remission, COPD, chronic tobacco abuser presents the ER today with continued respiratory symptoms. She has been to urgent care 2 times the last month. Last time was about 2 weeks ago. She was prescribed Zithromax and a 10-day course of prednisone. She finished prednisone about 8 days ago. She has had a persistent cough. No fever. Cough is somewhat productive. No nausea. Had diarrhea today. In the ER due to persistent cough.  CTA PE study showed no PE.  She has got multifocal disease in the right lung.   CT abdomen pelvis showed new acute findings in the abdomen.   Assessment/Plan:   Multifocal pneumonia -CT chest shows multifocal disease in the right lung -Strep pneumo urinary antigen positive -Procalcitonin less than 0.10 -Still coughing up yellow phlegm -Continue cefadroxil, z Hello ithromax   COPD with acute exacerbation (HCC) -Continue prednisone 40 mg daily -DuoNebs 4 times daily -Add hypertonic saline twice a day, Mucinex 1200 mg daily, flutter valve every 4 hours   Chronic tobacco use 02-13-2024 patient advised she needs to absolutely quit smoking.  Thyroid nodule -1.5 cm thyroid nodule seen on CT chest -Will obtain thyroid ultrasound  History of breast cancer status postmastectomy -Stable     Medications     azithromycin  500 mg Oral Daily   cefadroxil  500 mg Oral BID   heparin  5,000 Units Subcutaneous Q8H   influenza vac split trivalent PF  0.5 mL Intramuscular Tomorrow-1000   ipratropium-albuterol  3 mL Nebulization QID   pneumococcal 20-valent conjugate vaccine  0.5 mL Intramuscular Tomorrow-1000   predniSONE  40 mg Oral Q breakfast     Data Reviewed:   CBG:  No results for input(s): "GLUCAP" in the last 168 hours.  SpO2: 93 %    Vitals:   02/13/24  2115 02/14/24 0415 02/14/24 0732 02/14/24 0828  BP: (!) 143/71 (!) 168/92  (!) 140/89  Pulse: 79 71 69 92  Resp: 20 20 18 18   Temp: 98.8 F (37.1 C) 99 F (37.2 C)  98.7 F (37.1 C)  TempSrc: Oral Oral  Oral  SpO2: 95% 97% 96% 93%  Weight:      Height:          Data Reviewed:  Basic Metabolic Panel: Recent Labs  Lab 02/13/24 0846  NA 138  K 3.9  CL 106  CO2 24  GLUCOSE 109*  BUN 16  CREATININE 0.75  CALCIUM 9.1    CBC: Recent Labs  Lab 02/13/24 0835  WBC 9.1  HGB 12.5  HCT 38.3  MCV 94.8  PLT 617*    LFT Recent Labs  Lab 02/13/24 0846  AST 18  ALT 14  ALKPHOS 81  BILITOT 0.7  PROT 7.1  ALBUMIN 2.7*     Antibiotics: Anti-infectives (From admission, onward)    Start     Dose/Rate Route Frequency Ordered Stop   02/14/24 2200  cefadroxil (DURICEF) capsule 500 mg        500 mg Oral 2 times daily 02/13/24 1359     02/14/24 1000  azithromycin (ZITHROMAX) tablet 500 mg        500 mg Oral Daily 02/13/24 1359     02/13/24 1200  ceFEPIme (MAXIPIME) 2 g in sodium chloride 0.9 % 100 mL IVPB  2 g 200 mL/hr over 30 Minutes Intravenous  Once 02/13/24 1154 02/13/24 1310   02/13/24 1200  azithromycin (ZITHROMAX) 500 mg in sodium chloride 0.9 % 250 mL IVPB  Status:  Discontinued        500 mg 250 mL/hr over 60 Minutes Intravenous Every 24 hours 02/13/24 1154 02/13/24 1358        DVT prophylaxis: Heparin  Code Status: Full code  Family Communication: No family at bedside   CONSULTS    Subjective   Still coughing up yellow phlegm.  Complains of right chest wall pain with coughing.   Objective    Physical Examination:   General-appears in no acute distress Heart-S1-S2, regular, no murmur auscultated Lungs-bilateral rhonchi auscultated Abdomen-soft, nontender, no organomegaly Extremities-no edema in the lower extremities Neuro-alert, oriented x3, no focal deficit noted  Status is: Inpatient:             Jahmarion Popoff S  Malik Ruffino   [facility] If 7PM-7AM, please contact night-coverage at [email or web address], Office  [phone]   02/14/2024, 9:49 AM  LOS: 0 days

## 2024-02-14 NOTE — Plan of Care (Signed)
  Problem: Education: Goal: Knowledge of General Education information will improve Description: Including pain rating scale, medication(s)/side effects and non-pharmacologic comfort measures Outcome: Progressing   Problem: Activity: Goal: Risk for activity intolerance will decrease Outcome: Progressing   Problem: Nutrition: Goal: Adequate nutrition will be maintained Outcome: Progressing   Problem: Elimination: Goal: Will not experience complications related to bowel motility Outcome: Progressing Goal: Will not experience complications related to urinary retention Outcome: Progressing   Problem: Safety: Goal: Ability to remain free from injury will improve Outcome: Progressing   Problem: Activity: Goal: Ability to tolerate increased activity will improve Outcome: Progressing

## 2024-02-15 DIAGNOSIS — J189 Pneumonia, unspecified organism: Secondary | ICD-10-CM | POA: Diagnosis not present

## 2024-02-15 MED ORDER — SYMBICORT 160-4.5 MCG/ACT IN AERO: 2.0000 | INHALATION_SPRAY | Freq: Two times a day (BID) | RESPIRATORY_TRACT | 1 refills | Status: AC

## 2024-02-15 MED ORDER — ACETAMINOPHEN 500 MG PO TABS: 1000.0000 mg | ORAL_TABLET | Freq: Four times a day (QID) | ORAL | Status: DC | PRN

## 2024-02-15 MED ORDER — IPRATROPIUM-ALBUTEROL 0.5-2.5 (3) MG/3ML IN SOLN: 3.0000 mL | Freq: Two times a day (BID) | RESPIRATORY_TRACT | Status: DC

## 2024-02-15 MED ORDER — GUAIFENESIN ER 600 MG PO TB12: 1200.0000 mg | ORAL_TABLET | Freq: Two times a day (BID) | ORAL | 0 refills | Status: AC

## 2024-02-15 MED ORDER — LOPERAMIDE HCL 2 MG PO TABS: 2.0000 mg | ORAL_TABLET | Freq: Four times a day (QID) | ORAL | 0 refills | Status: AC | PRN

## 2024-02-15 MED ORDER — IPRATROPIUM-ALBUTEROL 0.5-2.5 (3) MG/3ML IN SOLN: 3.0000 mL | RESPIRATORY_TRACT | 0 refills | Status: AC | PRN

## 2024-02-15 MED ORDER — CEFADROXIL 500 MG PO CAPS: 500.0000 mg | ORAL_CAPSULE | Freq: Two times a day (BID) | ORAL | 0 refills | Status: AC

## 2024-02-15 MED ORDER — NICOTINE 21 MG/24HR TD PT24: 21.0000 mg | MEDICATED_PATCH | Freq: Every day | TRANSDERMAL | 0 refills | Status: AC

## 2024-02-15 MED ORDER — ONDANSETRON HCL 8 MG PO TABS
4.0000 mg | ORAL_TABLET | Freq: Four times a day (QID) | ORAL | 0 refills | Status: AC | PRN
Start: 2024-02-15 — End: ?

## 2024-02-15 MED ORDER — HYDROCODONE BIT-HOMATROP MBR 5-1.5 MG/5ML PO SOLN: 5.0000 mL | Freq: Four times a day (QID) | ORAL | 0 refills | Status: DC | PRN

## 2024-02-15 MED ORDER — PREDNISONE 20 MG PO TABS: 40.0000 mg | ORAL_TABLET | Freq: Every day | ORAL | 0 refills | Status: DC

## 2024-02-15 NOTE — TOC Transition Note (Signed)
 Transition of Care [facility]) - Discharge Note   Patient Details  Name: Deanna Ramsey MRN: [id number] Date of Birth: 30-Oct-1966  [facility]) CM/SW Contact:  Rodney Langton, RN Phone Number: 02/15/2024, 11:42 AM   Clinical Narrative:     Patient confirms that nebulizer machine was delivered to the bedside.  She will be discharged today, family will provide transportation.  No further TOC needs.   Final next level of care: Home/Self Care Barriers to Discharge: Barriers Resolved   Patient Goals and CMS Choice            Discharge Placement                       Discharge Plan and Services Additional resources added to the After Visit Summary for                  DME Arranged: Nebulizer machine DME Agency: [employer] Date DME Agency Contacted: 02/14/24 Time DME Agency Contacted: 1108 Representative spoke with at DME Agency: Ada            Social Drivers of Health (SDOH) Interventions SDOH Screenings   Food Insecurity: No Food Insecurity (02/13/2024)  Housing: Low Risk  (02/13/2024)  Transportation Needs: No Transportation Needs (02/13/2024)  Utilities: At Risk (02/13/2024)  Tobacco Use: High Risk (02/13/2024)     Readmission Risk Interventions     No data to display

## 2024-02-15 NOTE — Progress Notes (Signed)
 Physician Discharge Summary   [name] MRN: [id number]  DOB: 1966/06/23   Admit:     Date of Admission: 02/13/2024 Admitted from: home   Discharge: Date of discharge: 02/15/24 Disposition: Home Condition at discharge: good  CODE STATUS: FULL CODE     Discharge Physician: Sunnie Nielsen, DO [facility]     PCP: Oswaldo Conroy, MD  Recommendations for Outpatient Follow-up:  Follow up with PCP Hessie Diener, Earl Lagos, MD in 1-2 weeks  Discharge Instructions     Diet - low sodium heart healthy   Complete by: As directed    Increase activity slowly   Complete by: As directed          Discharge Diagnoses: Principal Problem:   Multifocal pneumonia Active Problems:   COPD with acute exacerbation [facility])   Cigarette smoker       Hospital course / significant events:   HPI: 58 year old female prior history of breast cancer now in remission, COPD, chronic tobacco abuser presents the ER today with continued respiratory symptoms.  She has been to urgent care 2 times the last month.  Last time was about 2 weeks ago.  She was prescribed Zithromax and a 10-day course of prednisone.  She finished prednisone about 8 days ago.  She has had a persistent cough.  No fever.  Cough is somewhat productive.  No nausea.  Had diarrhea today.  In the ER due to persistent cough.  03/08: to ED, CTA PE study showed no PE.  She has got multifocal disease in the right lung. Admitted to hospitalist service  03/09: nausea this morning, on room air, pt repoerts feeling a lot better w/ Zofran and requesting for dc home today if possible.     Consultants:  none  Procedures/Surgeries: none      ASSESSMENT & PLAN:   Multifocal pneumonia Strep pneumo urinary antigen positive Procalcitonin less than 0.10 Still coughing up yellow phlegm Continue augmentin, azithromycin po to finish course    COPD with acute exacerbation  Continue prednisone 40 mg  daily DuoNebs 4 times daily Mucinex 1200 mg daily   Chronic tobacco use 02-13-2024 patient advised she needs to absolutely quit smoking. Nicotine patch rx on discharge    Thyroid nodule,  ultrasound results pending 1.5 cm thyroid nodule seen on CT chest Will obtain thyroid ultrasound - follow results outpatient    History of breast cancer status postmastectomy Stable    Borderline underweight based on BMI: Body mass index is 18.64 kg/m.  Underweight - under 18  overweight - 25 to 29 obese - 30 or more Class 1 obesity: BMI of 30.0 to 34 Class 2 obesity: BMI of 35.0 to 39 Class 3 obesity: BMI of 40.0 to 49 Super Morbid Obesity: BMI 50-59 Super-super Morbid Obesity: BMI 60+ Significantly low or high BMI is associated with higher medical risk.  Weight management advised as adjunct to other disease management and risk reduction treatments           Discharge Instructions  Allergies as of 02/15/2024       Reactions   Sulfa Antibiotics Nausea And Vomiting        Medication List     TAKE these medications    acetaminophen 500 MG tablet Commonly known as: TYLENOL Take 2 tablets (1,000 mg total) by mouth every 6 (six) hours as needed for mild pain (pain score 1-3), moderate pain (pain score 4-6), fever or headache (or Fever >/= 101). What changed:  medication strength how much to take reasons to take this   alendronate 70 MG tablet Commonly known as: FOSAMAX TAKE 1 TABLET(70 MG) BY MOUTH 1 TIME A WEEK WITH A FULL GLASS OF WATER AND ON AN EMPTY STOMACH   cefadroxil 500 MG capsule Commonly known as: DURICEF Take 1 capsule (500 mg total) by mouth 2 (two) times daily for 5 days.   guaiFENesin 600 MG 12 hr tablet Commonly known as: MUCINEX Take 2 tablets (1,200 mg total) by mouth 2 (two) times daily.   HYDROcodone bit-homatropine 5-1.5 MG/5ML syrup Commonly known as: HYCODAN Take 5 mLs by mouth every 6 (six) hours as needed for cough.    ipratropium-albuterol 0.5-2.5 (3) MG/3ML Soln Commonly known as: DUONEB Take 3 mLs by nebulization every 2 (two) hours as needed (wheeze, SOB).   loperamide 2 MG tablet Commonly known as: IMODIUM A-D Take 1-2 tablets (2-4 mg total) by mouth 4 (four) times daily as needed for diarrhea or loose stools.   nicotine 21 mg/24hr patch Commonly known as: NICODERM CQ - dosed in mg/24 hours Place 1 patch (21 mg total) onto the skin daily. Start taking on: February 16, 2024   ondansetron 8 MG tablet Commonly known as: ZOFRAN Take 0.5-1 tablets (4-8 mg total) by mouth every 6 (six) hours as needed for nausea or vomiting.   predniSONE 20 MG tablet Commonly known as: DELTASONE Take 2 tablets (40 mg total) by mouth daily with breakfast. Start taking on: February 16, 2024   Symbicort 160-4.5 MCG/ACT inhaler Generic drug: budesonide-formoterol Inhale 2 puffs into the lungs in the morning and at bedtime.               Durable Medical Equipment  (From admission, onward)           Start     Ordered   02/13/24 1352  For home use only DME Nebulizer machine  Once       Question Answer Comment  Patient needs a nebulizer to treat with the following condition COPD exacerbation (HCC)   Length of Need Lifetime   Additional equipment included Administration kit   Additional equipment included Filter      02/13/24 1351             Follow-up Information     Bender, Earl Lagos, MD. Schedule an appointment as soon as possible for a visit.   Specialty: Family Medicine Why: hospital follow up in 1-2 weeks if needed Contact information: [address] [place] [address] [phone]                 Allergies  Allergen Reactions   Sulfa Antibiotics Nausea And Vomiting     Subjective: pt feeling better this mornig, still some SOB and cough but she is ambulating well, no additional O2 needed,   Discharge Exam: BP (!) 141/89 (BP Location: Right Arm)    Pulse (!) 110   Temp 99 F (37.2 C)   Resp 18   Ht 5\' 5"  (1.651 m)   Wt 50.8 kg   LMP 02/28/2012   SpO2 94%   BMI 18.64 kg/m  General: Pt is alert, awake, not in acute distress Cardiovascular: RRR, S1/S2 +, no rubs, no gallops Respiratory: diminshed breath sounds all fields, no wheezing, no rhonchi Abdominal: Soft, NT, ND, bowel sounds + Extremities: no edema, no cyanosis     The results of significant diagnostics from this hospitalization (including imaging, microbiology, ancillary and laboratory) are listed below for  reference.     Microbiology: Recent Results (from the past 240 hours)  Blood culture (routine x 2)     Status: None (Preliminary result)   Collection Time: 02/13/24 12:13 PM   Specimen: BLOOD  Result Value Ref Range Status   Specimen Description BLOOD BLOOD LEFT ARM  Final   Special Requests   Final    BOTTLES DRAWN AEROBIC AND ANAEROBIC Blood Culture results may not be optimal due to an inadequate volume of blood received in culture bottles   Culture   Final    NO GROWTH 2 DAYS Performed at [facility], [address], [place], [place] [address]    Report Status PENDING  Incomplete  Blood culture (routine x 2)     Status: None (Preliminary result)   Collection Time: 02/13/24  5:24 PM   Specimen: BLOOD  Result Value Ref Range Status   Specimen Description BLOOD BLOOD LEFT ARM  Final   Special Requests   Final    BOTTLES DRAWN AEROBIC AND ANAEROBIC Blood Culture adequate volume   Culture   Final    NO GROWTH 2 DAYS Performed at [facility], [address], [place], [place] [address]    Report Status PENDING  Incomplete  Culture, blood (single)     Status: None (Preliminary result)   Collection Time: 02/13/24  5:24 PM   Specimen: BLOOD  Result Value Ref Range Status   Specimen Description BLOOD BLOOD RIGHT ARM  Final   Special Requests   Final    BOTTLES DRAWN AEROBIC AND ANAEROBIC Blood Culture adequate volume   Culture    Final    NO GROWTH 2 DAYS Performed at [facility], [address], [place], [place] [address]    Report Status PENDING  Incomplete  SARS Coronavirus 2 by RT PCR (hospital order, performed in [facility] hospital lab) *cepheid single result test* Anterior Nasal Swab     Status: None   Collection Time: 02/13/24  5:40 PM   Specimen: Anterior Nasal Swab  Result Value Ref Range Status   SARS Coronavirus 2 by RT PCR NEGATIVE NEGATIVE Final    Comment: (NOTE) SARS-CoV-2 target nucleic acids are NOT DETECTED.  The SARS-CoV-2 RNA is generally detectable in upper and lower respiratory specimens during the acute phase of infection. The lowest concentration of SARS-CoV-2 viral copies this assay can detect is 250 copies / mL. A negative result does not preclude SARS-CoV-2 infection and should not be used as the sole basis for treatment or other patient management decisions.  A negative result may occur with improper specimen collection / handling, submission of specimen other than nasopharyngeal swab, presence of viral mutation(s) within the areas targeted by this assay, and inadequate number of viral copies (<250 copies / mL). A negative result must be combined with clinical observations, patient history, and epidemiological information.  Fact Sheet for Patients:   RoadLapTop.co.za  Fact Sheet for Healthcare Providers: http://kim-miller.com/  This test is not yet approved or  cleared by the [place] FDA and has been authorized for detection and/or diagnosis of SARS-CoV-2 by FDA under an Emergency Use Authorization (EUA).  This EUA will remain in effect (meaning this test can be used) for the duration of the COVID-19 declaration under Section 564(b)(1) of the Act, 21 U.S.C. section 360bbb-3(b)(1), unless the authorization is terminated or revoked sooner.  Performed at [facility], [address] [place], [place]  [address]   Respiratory (~20 pathogens) panel by PCR  Status: None   Collection Time: 02/13/24  5:40 PM   Specimen: Nasopharyngeal Swab; Respiratory  Result Value Ref Range Status   Adenovirus NOT DETECTED NOT DETECTED Final   Coronavirus 229E NOT DETECTED NOT DETECTED Final    Comment: (NOTE) The Coronavirus on the Respiratory Panel, DOES NOT test for the novel  Coronavirus (2019 nCoV)    Coronavirus HKU1 NOT DETECTED NOT DETECTED Final   Coronavirus NL63 NOT DETECTED NOT DETECTED Final   Coronavirus OC43 NOT DETECTED NOT DETECTED Final   Metapneumovirus NOT DETECTED NOT DETECTED Final   Rhinovirus / Enterovirus NOT DETECTED NOT DETECTED Final   Influenza A NOT DETECTED NOT DETECTED Final   Influenza B NOT DETECTED NOT DETECTED Final   Parainfluenza Virus 1 NOT DETECTED NOT DETECTED Final   Parainfluenza Virus 2 NOT DETECTED NOT DETECTED Final   Parainfluenza Virus 3 NOT DETECTED NOT DETECTED Final   Parainfluenza Virus 4 NOT DETECTED NOT DETECTED Final   Respiratory Syncytial Virus NOT DETECTED NOT DETECTED Final   Bordetella pertussis NOT DETECTED NOT DETECTED Final   Bordetella Parapertussis NOT DETECTED NOT DETECTED Final   Chlamydophila pneumoniae NOT DETECTED NOT DETECTED Final   Mycoplasma pneumoniae NOT DETECTED NOT DETECTED Final    Comment: Performed at [facility], [address], [place], [place] [address]     Labs: BNP (last 3 results) Recent Labs    02/13/24 0846  BNP 57.6   Basic Metabolic Panel: Recent Labs  Lab 02/13/24 0846  NA 138  K 3.9  CL 106  CO2 24  GLUCOSE 109*  BUN 16  CREATININE 0.75  CALCIUM 9.1   Liver Function Tests: Recent Labs  Lab 02/13/24 0846  AST 18  ALT 14  ALKPHOS 81  BILITOT 0.7  PROT 7.1  ALBUMIN 2.7*   No results for input(s): "LIPASE", "AMYLASE" in the last 168 hours. No results for input(s): "AMMONIA" in the last 168 hours. CBC: Recent Labs  Lab 02/13/24 0835  WBC 9.1  HGB 12.5  HCT 38.3  MCV 94.8   PLT 617*   Cardiac Enzymes: No results for input(s): "CKTOTAL", "CKMB", "CKMBINDEX", "TROPONINI" in the last 168 hours. BNP: Invalid input(s): "POCBNP" CBG: No results for input(s): "GLUCAP" in the last 168 hours. D-Dimer Recent Labs    02/13/24 0846  DDIMER 2.67*   Hgb A1c No results for input(s): "HGBA1C" in the last 72 hours. Lipid Profile No results for input(s): "CHOL", "HDL", "LDLCALC", "TRIG", "CHOLHDL", "LDLDIRECT" in the last 72 hours. Thyroid function studies Recent Labs    02/13/24 1100  TSH 0.800   Anemia work up No results for input(s): "VITAMINB12", "FOLATE", "FERRITIN", "TIBC", "IRON", "RETICCTPCT" in the last 72 hours. Urinalysis    Component Value Date/Time   COLORURINE YELLOW 04/06/2020 0905   APPEARANCEUR CLEAR 04/06/2020 0905   APPEARANCEUR Hazy 09/03/2014 0833   LABSPEC 1.020 04/06/2020 0905   LABSPEC 1.024 09/03/2014 0833   PHURINE 5.5 04/06/2020 0905   GLUCOSEU NEGATIVE 04/06/2020 0905   GLUCOSEU Negative 09/03/2014 0833   HGBUR SMALL (A) 04/06/2020 0905   BILIRUBINUR NEGATIVE 04/06/2020 0905   BILIRUBINUR Negative 09/03/2014 0833   KETONESUR NEGATIVE 04/06/2020 0905   PROTEINUR NEGATIVE 04/06/2020 0905   NITRITE NEGATIVE 04/06/2020 0905   LEUKOCYTESUR NEGATIVE 04/06/2020 0905   LEUKOCYTESUR 2+ 09/03/2014 0833   Sepsis Labs Recent Labs  Lab 02/13/24 0835  WBC 9.1   Microbiology Recent Results (from the past 240 hours)  Blood culture (routine x 2)  Status: None (Preliminary result)   Collection Time: 02/13/24 12:13 PM   Specimen: BLOOD  Result Value Ref Range Status   Specimen Description BLOOD BLOOD LEFT ARM  Final   Special Requests   Final    BOTTLES DRAWN AEROBIC AND ANAEROBIC Blood Culture results may not be optimal due to an inadequate volume of blood received in culture bottles   Culture   Final    NO GROWTH 2 DAYS Performed at [facility], [address], [place], [place] [address]    Report Status PENDING   Incomplete  Blood culture (routine x 2)     Status: None (Preliminary result)   Collection Time: 02/13/24  5:24 PM   Specimen: BLOOD  Result Value Ref Range Status   Specimen Description BLOOD BLOOD LEFT ARM  Final   Special Requests   Final    BOTTLES DRAWN AEROBIC AND ANAEROBIC Blood Culture adequate volume   Culture   Final    NO GROWTH 2 DAYS Performed at [facility], [address], [place], [place] [address]    Report Status PENDING  Incomplete  Culture, blood (single)     Status: None (Preliminary result)   Collection Time: 02/13/24  5:24 PM   Specimen: BLOOD  Result Value Ref Range Status   Specimen Description BLOOD BLOOD RIGHT ARM  Final   Special Requests   Final    BOTTLES DRAWN AEROBIC AND ANAEROBIC Blood Culture adequate volume   Culture   Final    NO GROWTH 2 DAYS Performed at [facility], [address], [place], [place] [address]    Report Status PENDING  Incomplete  SARS Coronavirus 2 by RT PCR (hospital order, performed in [facility] hospital lab) *cepheid single result test* Anterior Nasal Swab     Status: None   Collection Time: 02/13/24  5:40 PM   Specimen: Anterior Nasal Swab  Result Value Ref Range Status   SARS Coronavirus 2 by RT PCR NEGATIVE NEGATIVE Final    Comment: (NOTE) SARS-CoV-2 target nucleic acids are NOT DETECTED.  The SARS-CoV-2 RNA is generally detectable in upper and lower respiratory specimens during the acute phase of infection. The lowest concentration of SARS-CoV-2 viral copies this assay can detect is 250 copies / mL. A negative result does not preclude SARS-CoV-2 infection and should not be used as the sole basis for treatment or other patient management decisions.  A negative result may occur with improper specimen collection / handling, submission of specimen other than nasopharyngeal swab, presence of viral mutation(s) within the areas targeted by this assay, and inadequate number of viral copies (<250  copies / mL). A negative result must be combined with clinical observations, patient history, and epidemiological information.  Fact Sheet for Patients:   RoadLapTop.co.za  Fact Sheet for Healthcare Providers: http://kim-miller.com/  This test is not yet approved or  cleared by the [place] FDA and has been authorized for detection and/or diagnosis of SARS-CoV-2 by FDA under an Emergency Use Authorization (EUA).  This EUA will remain in effect (meaning this test can be used) for the duration of the COVID-19 declaration under Section 564(b)(1) of the Act, 21 U.S.C. section 360bbb-3(b)(1), unless the authorization is terminated or revoked sooner.  Performed at [facility], [address] [place], [place] [address]   Respiratory (~20 pathogens) panel by PCR     Status: None   Collection Time: 02/13/24  5:40 PM   Specimen: Nasopharyngeal Swab; Respiratory  Result Value Ref  Range Status   Adenovirus NOT DETECTED NOT DETECTED Final   Coronavirus 229E NOT DETECTED NOT DETECTED Final    Comment: (NOTE) The Coronavirus on the Respiratory Panel, DOES NOT test for the novel  Coronavirus (2019 nCoV)    Coronavirus HKU1 NOT DETECTED NOT DETECTED Final   Coronavirus NL63 NOT DETECTED NOT DETECTED Final   Coronavirus OC43 NOT DETECTED NOT DETECTED Final   Metapneumovirus NOT DETECTED NOT DETECTED Final   Rhinovirus / Enterovirus NOT DETECTED NOT DETECTED Final   Influenza A NOT DETECTED NOT DETECTED Final   Influenza B NOT DETECTED NOT DETECTED Final   Parainfluenza Virus 1 NOT DETECTED NOT DETECTED Final   Parainfluenza Virus 2 NOT DETECTED NOT DETECTED Final   Parainfluenza Virus 3 NOT DETECTED NOT DETECTED Final   Parainfluenza Virus 4 NOT DETECTED NOT DETECTED Final   Respiratory Syncytial Virus NOT DETECTED NOT DETECTED Final   Bordetella pertussis NOT DETECTED NOT DETECTED Final   Bordetella Parapertussis NOT DETECTED NOT  DETECTED Final   Chlamydophila pneumoniae NOT DETECTED NOT DETECTED Final   Mycoplasma pneumoniae NOT DETECTED NOT DETECTED Final    Comment: Performed at [facility], [address], [place], [place] [address]   Imaging CT Angio Chest PE W/Cm &/Or Wo Cm Result Date: 02/13/2024 CLINICAL DATA:  Shortness of breath.  Nonlocalized abdominal pain. EXAM: CT ANGIOGRAPHY CHEST CT ABDOMEN AND PELVIS WITH CONTRAST TECHNIQUE: Multidetector CT imaging of the chest was performed using the standard protocol during bolus administration of intravenous contrast. Multiplanar CT image reconstructions and MIPs were obtained to evaluate the vascular anatomy. Multidetector CT imaging of the abdomen and pelvis was performed using the standard protocol during bolus administration of intravenous contrast. RADIATION DOSE REDUCTION: This exam was performed according to the departmental dose-optimization program which includes automated exposure control, adjustment of the mA and/or kV according to patient size and/or use of iterative reconstruction technique. CONTRAST:  75mL OMNIPAQUE IOHEXOL 350 MG/ML SOLN COMPARISON:  Chest CTA 12/08/2021.  Abdomen and pelvis CT 09/03/2014 FINDINGS: CTA CHEST FINDINGS Cardiovascular: The heart size is normal. No substantial pericardial effusion. Coronary artery calcification is evident. Mild atherosclerotic calcification is noted in the wall of the thoracic aorta. There is no filling defect within the opacified pulmonary arteries to suggest the presence of an acute pulmonary embolus. Mediastinum/Nodes: 1.5 cm right thyroid nodule evident. Scattered upper normal mediastinal lymph nodes evident. There is no hilar lymphadenopathy. The esophagus has normal imaging features. There is no axillary lymphadenopathy. Lungs/Pleura: Centrilobular and paraseptal emphysema evident. Stable pleuroparenchymal scarring in the right apex (21/5). Clustered nodularity seen right middle lobe on image 89/5 with  tree-in-bud opacity noted posterior right upper lobe on image 57/5. Dominant nodular component in this region is 8 mm on 57/5. Confluent masslike airspace opacity in the paraspinal right lower lobe measures 4.2 x 2.4 cm with adjacent tree-in-bud nodularity and another nodular component measuring 18 mm on 105/5. Similar areas of tree-in-bud nodularity seen in the peripheral left lower lobe. 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 No pleural effusion on today's exam. Musculoskeletal: No worrisome lytic or sclerotic osseous abnormality. Review of the MIP images confirms the above findings. CT ABDOMEN and PELVIS FINDINGS Hepatobiliary: No suspicious focal abnormality within the liver parenchyma. There is no evidence for gallstones, gallbladder wall thickening, or pericholecystic fluid. No intrahepatic or extrahepatic biliary dilation. Pancreas: No focal mass lesion. No dilatation of the main duct. No intraparenchymal cyst. No peripancreatic edema. Spleen: No splenomegaly. No  suspicious focal mass lesion. Adrenals/Urinary Tract: Right adrenal gland unremarkable. 14 mm left adrenal nodule was 13 mm on previous CT from 2015 consistent with benign etiology such as adenoma. No followup imaging is recommended. Washout characteristics today are also compatible with adenoma. Multiple areas of cortical scarring are seen in the right kidney with 2-3 mm nonobstructing stone in the lower pole. Several simple right renal cysts noted. No followup imaging is recommended. Cortical scarring noted in the left kidney as well with 2-3 mm nonobstructing stone in the lower pole region. 3.3 cm simple cyst noted upper pole region with 3.0 cm simple cyst lower interpolar region. Additional tiny hypodensities in the left kidney are too small to characterize but statistically most likely benign. No followup imaging is recommended. Stomach/Bowel: Stomach is unremarkable. No gastric wall thickening.  No evidence of outlet obstruction. Duodenum is normally positioned as is the ligament of Treitz. No small bowel wall thickening. No small bowel dilatation. The terminal ileum is normal. The appendix is normal. No gross colonic mass. No colonic wall thickening. Diverticular changes are noted in the left colon without evidence of diverticulitis. Vascular/Lymphatic: There is advanced atherosclerotic calcification of the abdominal aorta without aneurysm. There is no gastrohepatic or hepatoduodenal ligament lymphadenopathy. No retroperitoneal or mesenteric lymphadenopathy. No pelvic sidewall lymphadenopathy. Reproductive: The uterus is unremarkable.  There is no adnexal mass. Other: No intraperitoneal free fluid. Musculoskeletal: No worrisome lytic or sclerotic osseous abnormality. Review of the MIP images confirms the above findings. IMPRESSION: 1. No CT evidence for acute pulmonary embolus. 2. Multiple areas of nodular/confluent airspace disease in the right lung with scattered areas of tree-in-bud nodularity in both lungs. Imaging features are most likely related to multifocal pneumonia with atypical etiology a distinct consideration. Given the nodular appearance some of this disease in the right lung, follow-up CT chest in 3 months after therapy recommended to ensure resolution. 3. 1.5 cm right thyroid nodule. Recommend thyroid US (ref: J Am Coll Radiol. 2015 Feb;12(2): 143-50). 4. No acute findings in the abdomen or pelvis. Specifically, no findings to account for the patient's history of abdominal pain. 5. Colonic diverticulosis without diverticulitis. Bilateral cortical scarring with bilateral renal cysts. 6. Tiny nonobstructing bilateral renal stones. 7.  Emphysema (ICD10-J43.9) and Aortic Atherosclerosis (ICD10-170.0) Electronically Signed   By: Kennith Center M.D.   On: 02/13/2024 11:47   CT ABDOMEN PELVIS W CONTRAST Result Date: 02/13/2024 CLINICAL DATA:  Shortness of breath.  Nonlocalized abdominal pain.  EXAM: CT ANGIOGRAPHY CHEST CT ABDOMEN AND PELVIS WITH CONTRAST TECHNIQUE: Multidetector CT imaging of the chest was performed using the standard protocol during bolus administration of intravenous contrast. Multiplanar CT image reconstructions and MIPs were obtained to evaluate the vascular anatomy. Multidetector CT imaging of the abdomen and pelvis was performed using the standard protocol during bolus administration of intravenous contrast. RADIATION DOSE REDUCTION: This exam was performed according to the departmental dose-optimization program which includes automated exposure control, adjustment of the mA and/or kV according to patient size and/or use of iterative reconstruction technique. CONTRAST:  75mL OMNIPAQUE IOHEXOL 350 MG/ML SOLN COMPARISON:  Chest CTA 12/08/2021.  Abdomen and pelvis CT 09/03/2014 FINDINGS: CTA CHEST FINDINGS Cardiovascular: The heart size is normal. No substantial pericardial effusion. Coronary artery calcification is evident. Mild atherosclerotic calcification is noted in the wall of the thoracic aorta. There is no filling defect within the opacified pulmonary arteries to suggest the presence of an acute pulmonary embolus. Mediastinum/Nodes: 1.5 cm right thyroid nodule evident. Scattered upper normal mediastinal lymph nodes  evident. There is no hilar lymphadenopathy. The esophagus has normal imaging features. There is no axillary lymphadenopathy. Lungs/Pleura: Centrilobular and paraseptal emphysema evident. Stable pleuroparenchymal scarring in the right apex (21/5). Clustered nodularity seen right middle lobe on image 89/5 with tree-in-bud opacity noted posterior right upper lobe on image 57/5. Dominant nodular component in this region is 8 mm on 57/5. Confluent masslike airspace opacity in the paraspinal right lower lobe measures 4.2 x 2.4 cm with adjacent tree-in-bud nodularity and another nodular component measuring 18 mm on 105/5. Similar areas of tree-in-bud nodularity seen in the  peripheral left lower lobe. 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 No pleural effusion on today's exam. Musculoskeletal: No worrisome lytic or sclerotic osseous abnormality. Review of the MIP images confirms the above findings. CT ABDOMEN and PELVIS FINDINGS Hepatobiliary: No suspicious focal abnormality within the liver parenchyma. There is no evidence for gallstones, gallbladder wall thickening, or pericholecystic fluid. No intrahepatic or extrahepatic biliary dilation. Pancreas: No focal mass lesion. No dilatation of the main duct. No intraparenchymal cyst. No peripancreatic edema. Spleen: No splenomegaly. No suspicious focal mass lesion. Adrenals/Urinary Tract: Right adrenal gland unremarkable. 14 mm left adrenal nodule was 13 mm on previous CT from 2015 consistent with benign etiology such as adenoma. No followup imaging is recommended. Washout characteristics today are also compatible with adenoma. Multiple areas of cortical scarring are seen in the right kidney with 2-3 mm nonobstructing stone in the lower pole. Several simple right renal cysts noted. No followup imaging is recommended. Cortical scarring noted in the left kidney as well with 2-3 mm nonobstructing stone in the lower pole region. 3.3 cm simple cyst noted upper pole region with 3.0 cm simple cyst lower interpolar region. Additional tiny hypodensities in the left kidney are too small to characterize but statistically most likely benign. No followup imaging is recommended. Stomach/Bowel: Stomach is unremarkable. No gastric wall thickening. No evidence of outlet obstruction. Duodenum is normally positioned as is the ligament of Treitz. No small bowel wall thickening. No small bowel dilatation. The terminal ileum is normal. The appendix is normal. No gross colonic mass. No colonic wall thickening. Diverticular changes are noted in the left colon without evidence of diverticulitis.  Vascular/Lymphatic: There is advanced atherosclerotic calcification of the abdominal aorta without aneurysm. There is no gastrohepatic or hepatoduodenal ligament lymphadenopathy. No retroperitoneal or mesenteric lymphadenopathy. No pelvic sidewall lymphadenopathy. Reproductive: The uterus is unremarkable.  There is no adnexal mass. Other: No intraperitoneal free fluid. Musculoskeletal: No worrisome lytic or sclerotic osseous abnormality. Review of the MIP images confirms the above findings. IMPRESSION: 1. No CT evidence for acute pulmonary embolus. 2. Multiple areas of nodular/confluent airspace disease in the right lung with scattered areas of tree-in-bud nodularity in both lungs. Imaging features are most likely related to multifocal pneumonia with atypical etiology a distinct consideration. Given the nodular appearance some of this disease in the right lung, follow-up CT chest in 3 months after therapy recommended to ensure resolution. 3. 1.5 cm right thyroid nodule. Recommend thyroid US (ref: J Am Coll Radiol. 2015 Feb;12(2): 143-50). 4. No acute findings in the abdomen or pelvis. Specifically, no findings to account for the patient's history of abdominal pain. 5. Colonic diverticulosis without diverticulitis. Bilateral cortical scarring with bilateral renal cysts. 6. Tiny nonobstructing bilateral renal stones. 7.  Emphysema (ICD10-J43.9) and Aortic Atherosclerosis (ICD10-170.0) Electronically Signed   By: Kennith Center M.D.   On: 02/13/2024 11:47   DG Chest 2 View Result Date: 02/13/2024  CLINICAL DATA:  Shortness of breath.  Upper respiratory infection. EXAM: CHEST - 2 VIEW COMPARISON:  01/25/2022 FINDINGS: Lungs are hyperexpanded. Patchy and nodular airspace disease is seen in the right mid and lower lung. Left lung clear. Interstitial markings are diffusely coarsened with chronic features. The cardiopericardial silhouette is within normal limits for size. No pleural effusion. No acute bony abnormality.  IMPRESSION: Patchy and nodular airspace disease in the right mid and lower lung. Findings are likely infectious/inflammatory. Patient is scheduled for chest CTA to be performed during this visit and reader is referred to the report for that exam. Electronically Signed   By: Kennith Center M.D.   On: 02/13/2024 11:33      Time coordinating discharge: over 30 minutes  SIGNED:  Sunnie Nielsen DO [facility]

## 2024-02-15 NOTE — Progress Notes (Signed)
 Pt endorsed nausea with vomiting despite administration of PO Zofran. Dr. Lyn Hollingshead paged and endorsed IV Zofran could be given early. Medication administered and pt endorsed better nausea control. Will update Day RN.

## 2024-02-17 LAB — LEGIONELLA PNEUMOPHILA SEROGP 1 UR AG: L. pneumophila Serogp 1 Ur Ag: NEGATIVE

## 2024-02-18 LAB — CULTURE, BLOOD (ROUTINE X 2)
Culture: NO GROWTH
Culture: NO GROWTH
Special Requests: ADEQUATE

## 2024-02-18 LAB — CULTURE, BLOOD (SINGLE)
Culture: NO GROWTH
Special Requests: ADEQUATE

## 2024-02-25 NOTE — Discharge Summary (Signed)
 Physician Discharge Summary   [name] MRN: [id number]  DOB: 1966-05-16   Admit:     Date of Admission: 02/13/2024 Admitted from: home   Discharge: Date of discharge: 02/15/24 Disposition: Home Condition at discharge: good  CODE STATUS: FULL CODE     Discharge Physician: Sunnie Nielsen, DO [facility]     PCP: Oswaldo Conroy, MD  Recommendations for Outpatient Follow-up:  Follow up with PCP Hessie Diener, Earl Lagos, MD in 1-2 weeks  Discharge Instructions     Diet - low sodium heart healthy   Complete by: As directed    Increase activity slowly   Complete by: As directed          Discharge Diagnoses: Principal Problem:   Multifocal pneumonia Active Problems:   COPD with acute exacerbation [facility])   Cigarette smoker       Hospital course / significant events:   HPI: 58 year old female prior history of breast cancer now in remission, COPD, chronic tobacco abuser presents the ER today with continued respiratory symptoms.  She has been to urgent care 2 times the last month.  Last time was about 2 weeks ago.  She was prescribed Zithromax and a 10-day course of prednisone.  She finished prednisone about 8 days ago.  She has had a persistent cough.  No fever.  Cough is somewhat productive.  No nausea.  Had diarrhea today.  In the ER due to persistent cough.  03/08: to ED, CTA PE study showed no PE.  She has got multifocal disease in the right lung. Admitted to hospitalist service  03/09: nausea this morning, on room air, pt repoerts feeling a lot better w/ Zofran and requesting for dc home today if possible.     Consultants:  none  Procedures/Surgeries: none      ASSESSMENT & PLAN:   Multifocal pneumonia Strep pneumo urinary antigen positive Procalcitonin less than 0.10 Still coughing up yellow phlegm Continue augmentin, azithromycin po to finish course    COPD with acute exacerbation  Continue prednisone 40 mg  daily DuoNebs 4 times daily Mucinex 1200 mg daily   Chronic tobacco use 02-13-2024 patient advised she needs to absolutely quit smoking. Nicotine patch rx on discharge    Thyroid nodule,  ultrasound results pending 1.5 cm thyroid nodule seen on CT chest Will obtain thyroid ultrasound - follow results outpatient    History of breast cancer status postmastectomy Stable    Borderline underweight based on BMI: Body mass index is 18.64 kg/m.  Underweight - under 18  overweight - 25 to 29 obese - 30 or more Class 1 obesity: BMI of 30.0 to 34 Class 2 obesity: BMI of 35.0 to 39 Class 3 obesity: BMI of 40.0 to 49 Super Morbid Obesity: BMI 50-59 Super-super Morbid Obesity: BMI 60+ Significantly low or high BMI is associated with higher medical risk.  Weight management advised as adjunct to other disease management and risk reduction treatments           Discharge Instructions  Allergies as of 02/15/2024       Reactions   Sulfa Antibiotics Nausea And Vomiting        Medication List     TAKE these medications    acetaminophen 500 MG tablet Commonly known as: TYLENOL Take 2 tablets (1,000 mg total) by mouth every 6 (six) hours as needed for mild pain (pain score 1-3), moderate pain (pain score 4-6), fever or headache (or Fever >/= 101). What changed:  medication strength how much to take reasons to take this   alendronate 70 MG tablet Commonly known as: FOSAMAX TAKE 1 TABLET(70 MG) BY MOUTH 1 TIME A WEEK WITH A FULL GLASS OF WATER AND ON AN EMPTY STOMACH   guaiFENesin 600 MG 12 hr tablet Commonly known as: MUCINEX Take 2 tablets (1,200 mg total) by mouth 2 (two) times daily.   HYDROcodone bit-homatropine 5-1.5 MG/5ML syrup Commonly known as: HYCODAN Take 5 mLs by mouth every 6 (six) hours as needed for cough.   ipratropium-albuterol 0.5-2.5 (3) MG/3ML Soln Commonly known as: DUONEB Take 3 mLs by nebulization every 2 (two) hours as needed (wheeze, SOB).    loperamide 2 MG tablet Commonly known as: IMODIUM A-D Take 1-2 tablets (2-4 mg total) by mouth 4 (four) times daily as needed for diarrhea or loose stools.   nicotine 21 mg/24hr patch Commonly known as: NICODERM CQ - dosed in mg/24 hours Place 1 patch (21 mg total) onto the skin daily.   ondansetron 8 MG tablet Commonly known as: ZOFRAN Take 0.5-1 tablets (4-8 mg total) by mouth every 6 (six) hours as needed for nausea or vomiting.   predniSONE 20 MG tablet Commonly known as: DELTASONE Take 2 tablets (40 mg total) by mouth daily with breakfast.   Symbicort 160-4.5 MCG/ACT inhaler Generic drug: budesonide-formoterol Inhale 2 puffs into the lungs in the morning and at bedtime.       ASK your doctor about these medications    cefadroxil 500 MG capsule Commonly known as: DURICEF Take 1 capsule (500 mg total) by mouth 2 (two) times daily for 5 days. Ask about: Should I take this medication?         Follow-up Information     Bender, Earl Lagos, MD. Schedule an appointment as soon as possible for a visit.   Specialty: Family Medicine Why: hospital follow up in 1-2 weeks if needed Contact information: [address] [place] [address] [phone]                 Allergies  Allergen Reactions   Sulfa Antibiotics Nausea And Vomiting     Subjective: pt feeling better this mornig, still some SOB and cough but she is ambulating well, no additional O2 needed,   Discharge Exam: BP (!) 141/89 (BP Location: Right Arm)   Pulse (!) 110   Temp 99 F (37.2 C)   Resp 18   Ht 5\' 5"  (1.651 m)   Wt 50.8 kg   LMP 02/28/2012   SpO2 94%   BMI 18.64 kg/m  General: Pt is alert, awake, not in acute distress Cardiovascular: RRR, S1/S2 +, no rubs, no gallops Respiratory: diminshed breath sounds all fields, no wheezing, no rhonchi Abdominal: Soft, NT, ND, bowel sounds + Extremities: no edema, no cyanosis     The results of significant diagnostics  from this hospitalization (including imaging, microbiology, ancillary and laboratory) are listed below for reference.     Microbiology: No results found for this or any previous visit (from the past 240 hours).    Labs: BNP (last 3 results) Recent Labs    02/13/24 0846  BNP 57.6   Basic Metabolic Panel: No results for input(s): "NA", "K", "CL", "CO2", "GLUCOSE", "BUN", "CREATININE", "CALCIUM", "MG", "PHOS" in the last 168 hours.  Liver Function Tests: No results for input(s): "AST", "ALT", "ALKPHOS", "BILITOT", "PROT", "ALBUMIN" in the last 168 hours.  No results for input(s): "LIPASE", "AMYLASE" in the last 168 hours. No results for  input(s): "AMMONIA" in the last 168 hours. CBC: No results for input(s): "WBC", "NEUTROABS", "HGB", "HCT", "MCV", "PLT" in the last 168 hours.  Cardiac Enzymes: No results for input(s): "CKTOTAL", "CKMB", "CKMBINDEX", "TROPONINI" in the last 168 hours. BNP: Invalid input(s): "POCBNP" CBG: No results for input(s): "GLUCAP" in the last 168 hours. D-Dimer No results for input(s): "DDIMER" in the last 72 hours.  Hgb A1c No results for input(s): "HGBA1C" in the last 72 hours. Lipid Profile No results for input(s): "CHOL", "HDL", "LDLCALC", "TRIG", "CHOLHDL", "LDLDIRECT" in the last 72 hours. Thyroid function studies No results for input(s): "TSH", "T4TOTAL", "T3FREE", "THYROIDAB" in the last 72 hours.  Invalid input(s): "FREET3"  Anemia work up No results for input(s): "VITAMINB12", "FOLATE", "FERRITIN", "TIBC", "IRON", "RETICCTPCT" in the last 72 hours. Urinalysis    Component Value Date/Time   COLORURINE YELLOW 04/06/2020 0905   APPEARANCEUR CLEAR 04/06/2020 0905   APPEARANCEUR Hazy 09/03/2014 0833   LABSPEC 1.020 04/06/2020 0905   LABSPEC 1.024 09/03/2014 0833   PHURINE 5.5 04/06/2020 0905   GLUCOSEU NEGATIVE 04/06/2020 0905   GLUCOSEU Negative 09/03/2014 0833   HGBUR SMALL (A) 04/06/2020 0905   BILIRUBINUR NEGATIVE 04/06/2020 0905    BILIRUBINUR Negative 09/03/2014 0833   KETONESUR NEGATIVE 04/06/2020 0905   PROTEINUR NEGATIVE 04/06/2020 0905   NITRITE NEGATIVE 04/06/2020 0905   LEUKOCYTESUR NEGATIVE 04/06/2020 0905   LEUKOCYTESUR 2+ 09/03/2014 0833   Sepsis Labs No results for input(s): "WBC" in the last 168 hours.  Invalid input(s): "PROCALCITONIN", "LACTICIDVEN"  Microbiology No results found for this or any previous visit (from the past 240 hours).  Imaging CT Angio Chest PE W/Cm &/Or Wo Cm Result Date: 02/13/2024 CLINICAL DATA:  Shortness of breath.  Nonlocalized abdominal pain. EXAM: CT ANGIOGRAPHY CHEST CT ABDOMEN AND PELVIS WITH CONTRAST TECHNIQUE: Multidetector CT imaging of the chest was performed using the standard protocol during bolus administration of intravenous contrast. Multiplanar CT image reconstructions and MIPs were obtained to evaluate the vascular anatomy. Multidetector CT imaging of the abdomen and pelvis was performed using the standard protocol during bolus administration of intravenous contrast. RADIATION DOSE REDUCTION: This exam was performed according to the departmental dose-optimization program which includes automated exposure control, adjustment of the mA and/or kV according to patient size and/or use of iterative reconstruction technique. CONTRAST:  75mL OMNIPAQUE IOHEXOL 350 MG/ML SOLN COMPARISON:  Chest CTA 12/08/2021.  Abdomen and pelvis CT 09/03/2014 FINDINGS: CTA CHEST FINDINGS Cardiovascular: The heart size is normal. No substantial pericardial effusion. Coronary artery calcification is evident. Mild atherosclerotic calcification is noted in the wall of the thoracic aorta. There is no filling defect within the opacified pulmonary arteries to suggest the presence of an acute pulmonary embolus. Mediastinum/Nodes: 1.5 cm right thyroid nodule evident. Scattered upper normal mediastinal lymph nodes evident. There is no hilar lymphadenopathy. The esophagus has normal imaging features. There  is no axillary lymphadenopathy. Lungs/Pleura: Centrilobular and paraseptal emphysema evident. Stable pleuroparenchymal scarring in the right apex (21/5). Clustered nodularity seen right middle lobe on image 89/5 with tree-in-bud opacity noted posterior right upper lobe on image 57/5. Dominant nodular component in this region is 8 mm on 57/5. Confluent masslike airspace opacity in the paraspinal right lower lobe measures 4.2 x 2.4 cm with adjacent tree-in-bud nodularity and another nodular component measuring 18 mm on 105/5. Similar areas of tree-in-bud nodularity seen in the peripheral left lower lobe. 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 No pleural effusion on today's exam. Musculoskeletal: No worrisome lytic or  sclerotic osseous abnormality. Review of the MIP images confirms the above findings. CT ABDOMEN and PELVIS FINDINGS Hepatobiliary: No suspicious focal abnormality within the liver parenchyma. There is no evidence for gallstones, gallbladder wall thickening, or pericholecystic fluid. No intrahepatic or extrahepatic biliary dilation. Pancreas: No focal mass lesion. No dilatation of the main duct. No intraparenchymal cyst. No peripancreatic edema. Spleen: No splenomegaly. No suspicious focal mass lesion. Adrenals/Urinary Tract: Right adrenal gland unremarkable. 14 mm left adrenal nodule was 13 mm on previous CT from 2015 consistent with benign etiology such as adenoma. No followup imaging is recommended. Washout characteristics today are also compatible with adenoma. Multiple areas of cortical scarring are seen in the right kidney with 2-3 mm nonobstructing stone in the lower pole. Several simple right renal cysts noted. No followup imaging is recommended. Cortical scarring noted in the left kidney as well with 2-3 mm nonobstructing stone in the lower pole region. 3.3 cm simple cyst noted upper pole region with 3.0 cm simple cyst lower interpolar region.  Additional tiny hypodensities in the left kidney are too small to characterize but statistically most likely benign. No followup imaging is recommended. Stomach/Bowel: Stomach is unremarkable. No gastric wall thickening. No evidence of outlet obstruction. Duodenum is normally positioned as is the ligament of Treitz. No small bowel wall thickening. No small bowel dilatation. The terminal ileum is normal. The appendix is normal. No gross colonic mass. No colonic wall thickening. Diverticular changes are noted in the left colon without evidence of diverticulitis. Vascular/Lymphatic: There is advanced atherosclerotic calcification of the abdominal aorta without aneurysm. There is no gastrohepatic or hepatoduodenal ligament lymphadenopathy. No retroperitoneal or mesenteric lymphadenopathy. No pelvic sidewall lymphadenopathy. Reproductive: The uterus is unremarkable.  There is no adnexal mass. Other: No intraperitoneal free fluid. Musculoskeletal: No worrisome lytic or sclerotic osseous abnormality. Review of the MIP images confirms the above findings. IMPRESSION: 1. No CT evidence for acute pulmonary embolus. 2. Multiple areas of nodular/confluent airspace disease in the right lung with scattered areas of tree-in-bud nodularity in both lungs. Imaging features are most likely related to multifocal pneumonia with atypical etiology a distinct consideration. Given the nodular appearance some of this disease in the right lung, follow-up CT chest in 3 months after therapy recommended to ensure resolution. 3. 1.5 cm right thyroid nodule. Recommend thyroid US (ref: J Am Coll Radiol. 2015 Feb;12(2): 143-50). 4. No acute findings in the abdomen or pelvis. Specifically, no findings to account for the patient's history of abdominal pain. 5. Colonic diverticulosis without diverticulitis. Bilateral cortical scarring with bilateral renal cysts. 6. Tiny nonobstructing bilateral renal stones. 7.  Emphysema (ICD10-J43.9) and Aortic  Atherosclerosis (ICD10-170.0) Electronically Signed   By: Kennith Center M.D.   On: 02/13/2024 11:47   CT ABDOMEN PELVIS W CONTRAST Result Date: 02/13/2024 CLINICAL DATA:  Shortness of breath.  Nonlocalized abdominal pain. EXAM: CT ANGIOGRAPHY CHEST CT ABDOMEN AND PELVIS WITH CONTRAST TECHNIQUE: Multidetector CT imaging of the chest was performed using the standard protocol during bolus administration of intravenous contrast. Multiplanar CT image reconstructions and MIPs were obtained to evaluate the vascular anatomy. Multidetector CT imaging of the abdomen and pelvis was performed using the standard protocol during bolus administration of intravenous contrast. RADIATION DOSE REDUCTION: This exam was performed according to the departmental dose-optimization program which includes automated exposure control, adjustment of the mA and/or kV according to patient size and/or use of iterative reconstruction technique. CONTRAST:  75mL OMNIPAQUE IOHEXOL 350 MG/ML SOLN COMPARISON:  Chest CTA 12/08/2021.  Abdomen and pelvis  CT 09/03/2014 FINDINGS: CTA CHEST FINDINGS Cardiovascular: The heart size is normal. No substantial pericardial effusion. Coronary artery calcification is evident. Mild atherosclerotic calcification is noted in the wall of the thoracic aorta. There is no filling defect within the opacified pulmonary arteries to suggest the presence of an acute pulmonary embolus. Mediastinum/Nodes: 1.5 cm right thyroid nodule evident. Scattered upper normal mediastinal lymph nodes evident. There is no hilar lymphadenopathy. The esophagus has normal imaging features. There is no axillary lymphadenopathy. Lungs/Pleura: Centrilobular and paraseptal emphysema evident. Stable pleuroparenchymal scarring in the right apex (21/5). Clustered nodularity seen right middle lobe on image 89/5 with tree-in-bud opacity noted posterior right upper lobe on image 57/5. Dominant nodular component in this region is 8 mm on 57/5. Confluent  masslike airspace opacity in the paraspinal right lower lobe measures 4.2 x 2.4 cm with adjacent tree-in-bud nodularity and another nodular component measuring 18 mm on 105/5. Similar areas of tree-in-bud nodularity seen in the peripheral left lower lobe. 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 No pleural effusion on today's exam. Musculoskeletal: No worrisome lytic or sclerotic osseous abnormality. Review of the MIP images confirms the above findings. CT ABDOMEN and PELVIS FINDINGS Hepatobiliary: No suspicious focal abnormality within the liver parenchyma. There is no evidence for gallstones, gallbladder wall thickening, or pericholecystic fluid. No intrahepatic or extrahepatic biliary dilation. Pancreas: No focal mass lesion. No dilatation of the main duct. No intraparenchymal cyst. No peripancreatic edema. Spleen: No splenomegaly. No suspicious focal mass lesion. Adrenals/Urinary Tract: Right adrenal gland unremarkable. 14 mm left adrenal nodule was 13 mm on previous CT from 2015 consistent with benign etiology such as adenoma. No followup imaging is recommended. Washout characteristics today are also compatible with adenoma. Multiple areas of cortical scarring are seen in the right kidney with 2-3 mm nonobstructing stone in the lower pole. Several simple right renal cysts noted. No followup imaging is recommended. Cortical scarring noted in the left kidney as well with 2-3 mm nonobstructing stone in the lower pole region. 3.3 cm simple cyst noted upper pole region with 3.0 cm simple cyst lower interpolar region. Additional tiny hypodensities in the left kidney are too small to characterize but statistically most likely benign. No followup imaging is recommended. Stomach/Bowel: Stomach is unremarkable. No gastric wall thickening. No evidence of outlet obstruction. Duodenum is normally positioned as is the ligament of Treitz. No small bowel wall thickening. No  small bowel dilatation. The terminal ileum is normal. The appendix is normal. No gross colonic mass. No colonic wall thickening. Diverticular changes are noted in the left colon without evidence of diverticulitis. Vascular/Lymphatic: There is advanced atherosclerotic calcification of the abdominal aorta without aneurysm. There is no gastrohepatic or hepatoduodenal ligament lymphadenopathy. No retroperitoneal or mesenteric lymphadenopathy. No pelvic sidewall lymphadenopathy. Reproductive: The uterus is unremarkable.  There is no adnexal mass. Other: No intraperitoneal free fluid. Musculoskeletal: No worrisome lytic or sclerotic osseous abnormality. Review of the MIP images confirms the above findings. IMPRESSION: 1. No CT evidence for acute pulmonary embolus. 2. Multiple areas of nodular/confluent airspace disease in the right lung with scattered areas of tree-in-bud nodularity in both lungs. Imaging features are most likely related to multifocal pneumonia with atypical etiology a distinct consideration. Given the nodular appearance some of this disease in the right lung, follow-up CT chest in 3 months after therapy recommended to ensure resolution. 3. 1.5 cm right thyroid nodule. Recommend thyroid US (ref: J Am Coll Radiol. 2015 Feb;12(2): 143-50). 4. No acute findings in  the abdomen or pelvis. Specifically, no findings to account for the patient's history of abdominal pain. 5. Colonic diverticulosis without diverticulitis. Bilateral cortical scarring with bilateral renal cysts. 6. Tiny nonobstructing bilateral renal stones. 7.  Emphysema (ICD10-J43.9) and Aortic Atherosclerosis (ICD10-170.0) Electronically Signed   By: Kennith Center M.D.   On: 02/13/2024 11:47   DG Chest 2 View Result Date: 02/13/2024 CLINICAL DATA:  Shortness of breath.  Upper respiratory infection. EXAM: CHEST - 2 VIEW COMPARISON:  01/25/2022 FINDINGS: Lungs are hyperexpanded. Patchy and nodular airspace disease is seen in the right mid and  lower lung. Left lung clear. Interstitial markings are diffusely coarsened with chronic features. The cardiopericardial silhouette is within normal limits for size. No pleural effusion. No acute bony abnormality. IMPRESSION: Patchy and nodular airspace disease in the right mid and lower lung. Findings are likely infectious/inflammatory. Patient is scheduled for chest CTA to be performed during this visit and reader is referred to the report for that exam. Electronically Signed   By: Kennith Center M.D.   On: 02/13/2024 11:33      Time coordinating discharge: over 30 minutes  SIGNED:  Sunnie Nielsen DO [facility]

## 2024-03-27 ENCOUNTER — Emergency Department
Admission: EM | Admit: 2024-03-27 | Discharge: 2024-03-27 | Disposition: A | Discharge status: Home / Self Care | Attending: Emergency Medicine | Admitting: Emergency Medicine

## 2024-03-27 ENCOUNTER — Other Ambulatory Visit: Payer: Self-pay

## 2024-03-27 ENCOUNTER — Emergency Department

## 2024-03-27 DIAGNOSIS — I10 Essential (primary) hypertension: Secondary | ICD-10-CM | POA: Diagnosis not present

## 2024-03-27 DIAGNOSIS — J449 Chronic obstructive pulmonary disease, unspecified: Secondary | ICD-10-CM | POA: Diagnosis not present

## 2024-03-27 DIAGNOSIS — R300 Dysuria: Secondary | ICD-10-CM | POA: Diagnosis present

## 2024-03-27 DIAGNOSIS — N390 Urinary tract infection, site not specified: Secondary | ICD-10-CM | POA: Insufficient documentation

## 2024-03-27 LAB — URINALYSIS, ROUTINE W REFLEX MICROSCOPIC
Bilirubin Urine: NEGATIVE
Glucose, UA: NEGATIVE mg/dL
Ketones, ur: NEGATIVE mg/dL
Nitrite: POSITIVE — AB
Protein, ur: NEGATIVE mg/dL
Specific Gravity, Urine: 1.013 (ref 1.005–1.030)
WBC, UA: >50 WBC/hpf (ref 0–5)
pH: 5 (ref 5.0–8.0)

## 2024-03-27 LAB — BASIC METABOLIC PANEL WITH GFR
Anion gap: 8 (ref 5–15)
BUN: 18 mg/dL (ref 6–20)
CO2: 26 mmol/L (ref 22–32)
Calcium: 9.2 mg/dL (ref 8.9–10.3)
Chloride: 105 mmol/L (ref 98–111)
Creatinine, Ser: 0.78 mg/dL (ref 0.44–1.00)
GFR, Estimated: >60 mL/min (ref >60)
Glucose, Bld: 72 mg/dL (ref 70–99)
Potassium: 3.8 mmol/L (ref 3.5–5.1)
Sodium: 139 mmol/L (ref 135–145)

## 2024-03-27 LAB — CBC
HCT: 41.4 % (ref 36.0–46.0)
Hemoglobin: 13.4 g/dL (ref 12.0–15.0)
MCH: 31.2 pg (ref 26.0–34.0)
MCHC: 32.4 g/dL (ref 30.0–36.0)
MCV: 96.3 fL (ref 80.0–100.0)
Platelets: 314 10*3/uL (ref 150–400)
RBC: 4.3 MIL/uL (ref 3.87–5.11)
RDW: 14.7 % (ref 11.5–15.5)
WBC: 7.8 10*3/uL (ref 4.0–10.5)
nRBC: 0 % (ref 0.0–0.2)

## 2024-03-27 MED ORDER — CEPHALEXIN 500 MG PO CAPS: 500.0000 mg | ORAL_CAPSULE | Freq: Two times a day (BID) | ORAL | 0 refills | Status: DC

## 2024-03-27 MED ORDER — IPRATROPIUM-ALBUTEROL 0.5-2.5 (3) MG/3ML IN SOLN
3.0000 mL | Freq: Once | RESPIRATORY_TRACT | Status: AC
  Administered 2024-03-27: 3 mL via RESPIRATORY_TRACT
  Filled 2024-03-27: qty 3

## 2024-03-27 MED ORDER — SODIUM CHLORIDE 0.9 % IV SOLN
1.0000 g | Freq: Once | INTRAVENOUS | Status: AC
  Administered 2024-03-27: 1 g via INTRAVENOUS
  Filled 2024-03-27: qty 10

## 2024-03-27 NOTE — ED Provider Notes (Signed)
 [facility] Provider Note    Event Date/Time   First MD Initiated Contact with Patient 03/27/24 [phone]     (approximate)  History   Chief Complaint: Dysuria, dyspnea  HPI  Deanna Ramsey is a 58 y.o. female with a past medical history of arthritis, COPD, hypertension, presents to the emergency department for dyspnea and dysuria.  According to the patient for the last 2 weeks or so she has been experiencing some worsening shortness of breath.  Patient states approximately 1 to 1.5 months ago she was admitted for pneumonia.  States she mostly improved but continues to have some mild shortness of breath and cough.  Patient also states for the past 1 to 2 weeks she has been experiencing urinary discomfort described as a burning sensation when she urinates.  No known fever.  No chest pain.  Physical Exam   Triage Vital Signs: ED Triage Vitals  Encounter Vitals Group     BP 03/27/24 0752 (!) 172/104     Systolic BP Percentile --      Diastolic BP Percentile --      Pulse Rate 03/27/24 0752 72     Resp 03/27/24 0752 18     Temp 03/27/24 0752 98.5 F (36.9 C)     Temp Source 03/27/24 0752 Oral     SpO2 03/27/24 0752 97 %     Weight 03/27/24 0759 110 lb (49.9 kg)     Height 03/27/24 0759 5\' 5"  (1.651 m)     Head Circumference --      Peak Flow --      Pain Score 03/27/24 0758 0     Pain Loc --      Pain Education --      Exclude from Growth Chart --     Most recent vital signs: Vitals:   03/27/24 0752  BP: (!) 172/104  Pulse: 72  Resp: 18  Temp: 98.5 F (36.9 C)  SpO2: 97%    General: Awake, no distress.  CV:  Good peripheral perfusion.  Regular rate and rhythm  Resp:  Normal effort.  Equal breath sounds bilaterally.  Somewhat diminished breath sounds bilaterally but no significant wheeze.  No rhonchi or rales. Abd:  No distention.  Soft, nontender.  No rebound or guarding.  ED Results / Procedures / Treatments   EKG  EKG viewed and interpreted  by myself shows a normal sinus rhythm at 72 bpm with a narrow QRS, normal axis, normal intervals, no concerning ST changes.  RADIOLOGY  I have reviewed and interpreted chest x-ray images.  No consolidation seen on my evaluation. Radiology has read the x-ray is negative for acute process.  Does show chronic emphysema.  MEDICATIONS ORDERED IN ED: Medications  cefTRIAXone  (ROCEPHIN ) 1 g in sodium chloride  0.9 % 100 mL IVPB (has no administration in time range)  ipratropium-albuterol  (DUONEB) 0.5-2.5 (3) MG/3ML nebulizer solution 3 mL (has no administration in time range)  ipratropium-albuterol  (DUONEB) 0.5-2.5 (3) MG/3ML nebulizer solution 3 mL (has no administration in time range)     IMPRESSION / MDM / ASSESSMENT AND PLAN / ED COURSE  I reviewed the triage vital signs and the nursing notes.  Patient's presentation is most consistent with acute presentation with potential threat to life or bodily function.  Patient presents to the emergency department for continued shortness of breath as well as dysuria over the last 2 weeks or so.  Overall the patient appears very well, nontoxic with no respiratory distress.  Speaking in full sentences.  Vital signs show mild hypertension otherwise reassuring with a 100% room air saturation throughout my evaluation.  Patient's workup shows a reassuring chemistry reassuring CBC with a normal white blood cell count, chest x-ray is clear.  Patient's urinalysis however does show greater than 50 white cells and nitrite positive consistent with urinary tract infection.  We will dose IV Rocephin  given the patient's symptoms we will also send a urine culture.  We will dose a DuoNeb while we have the patient in the emergency department however from a COPD perspective overall patient appears well.  Given the patient's reassuring workup anticipate likely discharge home with oral antibiotics following her IV antibiotics.    FINAL CLINICAL IMPRESSION(S) / ED DIAGNOSES    Urinary tract infection COPD   Note:  This document was prepared using Dragon voice recognition software and may include unintentional dictation errors.   Ruth Cove, MD 03/27/24 1034

## 2024-03-27 NOTE — ED Notes (Signed)
 Pt brought to room by XR, given hat to place in toilet for sample.

## 2024-03-27 NOTE — ED Triage Notes (Signed)
 Pt states she was admitted 5 weeks ago for pneumonia. States she still doesn't feel good overall - still feels weak and short of breath. Also complains of pain with urination and urinary frequency x 2 weeks.

## 2024-03-29 LAB — URINE CULTURE: Culture: >=100000 — AB

## 2024-03-30 NOTE — Progress Notes (Addendum)
 ED Antimicrobial Stewardship Positive Culture Follow Up   Deanna Ramsey is an 58 y.o. female who presented to [facility] on 03/27/2024 with a chief complaint of No chief complaint on file.  Recent Results (from the past 720 hours)  Urine Culture     Status: Abnormal   Collection Time: 03/27/24  7:54 AM   Specimen: Urine, Clean Catch  Result Value Ref Range Status   Specimen Description   Final    URINE, CLEAN CATCH Performed at [facility], [address], [place], [place] [address]    Special Requests   Final    NONE Performed at [facility], [address] [place], [place] [address]    Culture (A)  Final    >=100,000 COLONIES/mL KLEBSIELLA PNEUMONIAE Confirmed Extended Spectrum Beta-Lactamase Producer (ESBL).  In bloodstream infections from ESBL organisms, carbapenems are preferred over piperacillin /tazobactam. They are shown to have a lower risk of mortality.    Report Status 03/29/2024 FINAL  Final   Organism ID, Bacteria KLEBSIELLA PNEUMONIAE (A)  Final      Susceptibility   Klebsiella pneumoniae - MIC*    AMPICILLIN  >=32 RESISTANT Resistant     CEFAZOLIN  RESISTANT Resistant     CEFEPIME  <=0.12 SENSITIVE Sensitive     CEFTRIAXONE  0.5 SENSITIVE Sensitive     CIPROFLOXACIN  1 RESISTANT Resistant     GENTAMICIN <=1 SENSITIVE Sensitive     IMIPENEM 0.5 SENSITIVE Sensitive     NITROFURANTOIN 64 INTERMEDIATE Intermediate     TRIMETH/SULFA <=20 SENSITIVE Sensitive     AMPICILLIN /SULBACTAM >=32 RESISTANT Resistant     PIP/TAZO >=128 RESISTANT Resistant ug/mL    * >=100,000 COLONIES/mL KLEBSIELLA PNEUMONIAE   [x]  Treated with cephalexin , organism resistant to prescribed antimicrobial  Patient has ESBL Klebsiella UTI and will need to return to ED to receive IV antibiotic treatment. Pharmacy will attempt to contact patient to advise her to return to ED.  4/22: Unsuccessfully attempted to reach patient via phone x2 4/23: Unsuccessfully attempted to reach  patient via phone, called both [phone] & [phone] 4/24: Unsuccessfully attempted to reach patient via phone  ED Provider: Iven Mark, MD  Thank you for involving pharmacy in this patient's care.   Ananias Balls, PharmD Clinical Pharmacist 03/30/2024 10:02 AM  Addendem by  Craven Do, PharmD Pharmacy Resident  04/04/2024 3:55 PM

## 2024-04-29 ENCOUNTER — Ambulatory Visit: Payer: Medicaid Other | Admitting: Oncology

## 2024-05-13 ENCOUNTER — Ambulatory Visit
Admission: RE | Admit: 2024-05-13 | Discharge: 2024-05-13 | Disposition: A | Discharge status: Home / Self Care | Source: Ambulatory Visit | Attending: Oncology | Admitting: Oncology

## 2024-05-13 DIAGNOSIS — C50212 Malignant neoplasm of upper-inner quadrant of left female breast: Secondary | ICD-10-CM | POA: Diagnosis present

## 2024-05-13 DIAGNOSIS — Z13820 Encounter for screening for osteoporosis: Secondary | ICD-10-CM | POA: Insufficient documentation

## 2024-05-13 DIAGNOSIS — Z1231 Encounter for screening mammogram for malignant neoplasm of breast: Secondary | ICD-10-CM | POA: Diagnosis present

## 2024-05-13 DIAGNOSIS — M810 Age-related osteoporosis without current pathological fracture: Secondary | ICD-10-CM | POA: Insufficient documentation

## 2024-05-13 DIAGNOSIS — Z171 Estrogen receptor negative status [ER-]: Secondary | ICD-10-CM | POA: Insufficient documentation

## 2024-05-17 ENCOUNTER — Encounter: Payer: Self-pay | Admitting: Oncology

## 2024-05-17 ENCOUNTER — Inpatient Hospital Stay: Attending: Oncology | Admitting: Oncology

## 2024-05-17 VITALS — BP 141/86 | HR 73 | Temp 97.0°F | Resp 16 | Ht 65.0 in | Wt 106.4 lb

## 2024-05-17 DIAGNOSIS — Z1732 Human epidermal growth factor receptor 2 negative status: Secondary | ICD-10-CM | POA: Diagnosis not present

## 2024-05-17 DIAGNOSIS — Z1722 Progesterone receptor negative status: Secondary | ICD-10-CM | POA: Insufficient documentation

## 2024-05-17 DIAGNOSIS — C50212 Malignant neoplasm of upper-inner quadrant of left female breast: Secondary | ICD-10-CM | POA: Insufficient documentation

## 2024-05-17 DIAGNOSIS — M810 Age-related osteoporosis without current pathological fracture: Secondary | ICD-10-CM | POA: Diagnosis present

## 2024-05-17 DIAGNOSIS — Z171 Estrogen receptor negative status [ER-]: Secondary | ICD-10-CM | POA: Diagnosis not present

## 2024-05-17 NOTE — Progress Notes (Unsigned)
 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  INTERVAL HISTORY: Patient returns to clinic today for routine 42-month evaluation.  She currently feels well and is asymptomatic.  She continues to be active and work full-time. She has no neurologic complaints.  She has a good appetite and denies weight loss.  She has no chest pain, shortness of breath, or hemoptysis.  She denies any nausea, vomiting, constipation, or diarrhea.  She has no urinary complaints.  Patient offers no specific complaints today.  REVIEW OF SYSTEMS:   Review of Systems  Constitutional: Negative.  Negative for fever, malaise/fatigue and weight loss.  HENT:  Negative for congestion.   Respiratory: Negative.  Negative for cough, hemoptysis and shortness of breath.   Cardiovascular: Negative.  Negative for chest pain and leg swelling.  Gastrointestinal: Negative.  Negative for abdominal pain, diarrhea and nausea.  Genitourinary: Negative.  Negative for dysuria.  Musculoskeletal: Negative.  Negative for back pain.  Skin: Negative.  Negative for rash.  Neurological: Negative.  Negative for dizziness, focal weakness, weakness and headaches.  Psychiatric/Behavioral: Negative.  The patient is not nervous/anxious and does not have insomnia.     As per HPI. Otherwise, a complete review of systems is negative.  PAST MEDICAL HISTORY: Past Medical History:  Diagnosis Date   Arthritis    Asthmatic  bronchitis , chronic (HCC) 03/07/2022   Breast cancer (HCC) 01/2020   left breast triple neagative IMC, HG DCIS   Cellulitis    COPD (chronic obstructive pulmonary disease) (HCC)    Depression    History of kidney stones    History of tracheostomy 2000   stated that epiglottis swelled up for an unknown reason; had trach for about a week.    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   Pneumonia     PAST SURGICAL HISTORY: Past Surgical History:  Procedure Laterality Date   arm surgery  1984   fractured ulna   BREAST BIOPSY Left 01/2020   IMC 3:00 10cmfn us  bx   BREAST LUMPECTOMY Left 02/04/2020   triple negative, IMC and HG DCIS with positive margins,  6 LN's negative    BREAST LUMPECTOMY Left 02/24/2020   re excision for margins   BREAST LUMPECTOMY WITH SENTINEL LYMPH NODE BIOPSY Left 02/04/2020   Procedure: BREAST LUMPECTOMY WITH SENTINEL LYMPH NODE BX;  Surgeon: Alben Alma, MD;  Location: [facility] ORS;  Service: General;  Laterality: Left;   CLEFT LIP REPAIR     FRACTURE SURGERY     kidney stones     lymphnode neck     PORTACATH PLACEMENT N/A 02/04/2020   Procedure: INSERTION PORT-A-CATH;  Surgeon: Alben Alma, MD;  Location: [facility] ORS;  Service: General;  Laterality: N/A;   RE-EXCISION OF BREAST LUMPECTOMY Left 02/24/2020   Procedure: RE-EXCISION OF Left BREAST LUMPECTOMY;  Surgeon: Alben Alma, MD;  Location: [facility]  ORS;  Service: General;  Laterality: Left;   TYMPANOSTOMY TUBE PLACEMENT      FAMILY HISTORY: Family History  Problem Relation Age of Onset   COPD Other    COPD Mother    Hypertension Mother    COPD Father    Breast cancer Neg Hx     ADVANCED DIRECTIVES (Y/N):  N  HEALTH MAINTENANCE: Social History   Tobacco Use   Smoking status: Every Day    Current packs/day: 2.00    Average packs/day: 2.0 packs/day  for 40.0 years (80.0 ttl pk-yrs)    Types: Cigarettes   Smokeless tobacco: Never   Tobacco comments:    1PPD 03/07/2022  Vaping Use   Vaping status: Never Used  Substance Use Topics   Alcohol use: No   Drug use: No     Colonoscopy:  PAP:  Bone density:  Lipid panel:  Allergies  Allergen Reactions   Sulfa Antibiotics Nausea And Vomiting    Current Outpatient Medications  Medication Sig Dispense Refill   acetaminophen  (TYLENOL ) 500 MG tablet Take 2 tablets (1,000 mg total) by mouth every 6 (six) hours as needed for mild pain (pain score 1-3), moderate pain (pain score 4-6), fever or headache (or Fever >/= 101).     alendronate  (FOSAMAX ) 70 MG tablet TAKE 1 TABLET(70 MG) BY MOUTH 1 TIME A WEEK WITH A FULL GLASS OF WATER AND ON AN EMPTY STOMACH 4 tablet 3   CALCIUM-VITAMIN D PO Take by mouth.     lisinopril -hydrochlorothiazide (ZESTORETIC) 10-12.5 MG tablet Take 1 tablet by mouth daily.     nicotine  (NICODERM CQ  - DOSED IN MG/24 HOURS) 21 mg/24hr patch Place 1 patch (21 mg total) onto the skin daily. 28 patch 0   ondansetron  (ZOFRAN ) 8 MG tablet Take 0.5-1 tablets (4-8 mg total) by mouth every 6 (six) hours as needed for nausea or vomiting. 30 tablet 0   SYMBICORT  160-4.5 MCG/ACT inhaler Inhale 2 puffs into the lungs in the morning and at bedtime. 1 each 1   guaiFENesin  (MUCINEX ) 600 MG 12 hr tablet Take 2 tablets (1,200 mg total) by mouth 2 (two) times daily. (Patient not taking: Reported on 05/17/2024) 30 tablet 0   HYDROcodone  bit-homatropine (HYCODAN) 5-1.5 MG/5ML syrup Take 5 mLs by mouth every 6 (six) hours as needed for cough. (Patient not taking: Reported on 05/17/2024) 120 mL 0   ipratropium-albuterol  (DUONEB) 0.5-2.5 (3) MG/3ML SOLN Take 3 mLs by nebulization every 2 (two) hours as needed (wheeze, SOB). (Patient not taking: Reported on 05/17/2024) 120 mL 0   loperamide  (IMODIUM  A-D) 2 MG tablet Take 1-2 tablets (2-4 mg total) by mouth 4 (four) times daily as needed for diarrhea or  loose stools. (Patient not taking: Reported on 05/17/2024) 30 tablet 0   predniSONE  (DELTASONE ) 20 MG tablet Take 2 tablets (40 mg total) by mouth daily with breakfast. (Patient not taking: Reported on 05/17/2024) 2 tablet 0   No current facility-administered medications for this visit.   Facility-Administered Medications Ordered in Other Visits  Medication Dose Route Frequency Provider Last Rate Last Admin   sodium chloride  flush (NS) 0.9 % injection 10 mL  10 mL Intravenous PRN Ariyan Brisendine J, MD   10 mL at 06/01/20 0905   sodium chloride  flush (NS) 0.9 % injection 10 mL  10 mL Intravenous PRN Worth Kober J, MD   10 mL at 08/24/20 0900    OBJECTIVE: Vitals:   05/17/24 1422  BP: (!) 141/86  Pulse: 73  Resp:  16  Temp: (!) 97 F (36.1 C)  SpO2: 100%       Body mass index is 17.71 kg/m.    ECOG FS:0 - Asymptomatic  General: Well-developed, well-nourished, no acute distress. Eyes: Pink conjunctiva, anicteric sclera. HEENT: Normocephalic, moist mucous membranes. Lungs: No audible wheezing or coughing. Heart: Regular rate and rhythm. Abdomen: Soft, nontender, no obvious distention. Musculoskeletal: No edema, cyanosis, or clubbing. Neuro: Alert, answering all questions appropriately. Cranial nerves grossly intact. Skin: No rashes or petechiae noted. Psych: Normal affect.   LAB RESULTS:  Lab Results  Component Value Date   NA 139 03/27/2024   K 3.8 03/27/2024   CL 105 03/27/2024   CO2 26 03/27/2024   GLUCOSE 72 03/27/2024   BUN 18 03/27/2024   CREATININE 0.78 03/27/2024   CALCIUM 9.2 03/27/2024   PROT 7.1 02/13/2024   ALBUMIN 2.7 (L) 02/13/2024   AST 18 02/13/2024   ALT 14 02/13/2024   ALKPHOS 81 02/13/2024   BILITOT 0.7 02/13/2024   GFRNONAA >60 03/27/2024   GFRAA >60 08/24/2020    Lab Results  Component Value Date   WBC 7.8 03/27/2024   NEUTROABS 4.4 05/09/2022   HGB 13.4 03/27/2024   HCT 41.4 03/27/2024   MCV 96.3 03/27/2024   PLT 314 03/27/2024      STUDIES: DG Bone Density Result Date: 05/13/2024 EXAM: DUAL X-RAY ABSORPTIOMETRY (DXA) FOR BONE MINERAL DENSITY 05/13/2024 2:32 pm CLINICAL DATA:  58 year old Female Postmenopausal. Screening for osteoporosis Patient is or has been on bone building therapies. TECHNIQUE: An axial (e.g., hips, spine) and/or appendicular (e.g., radius) exam was performed, as appropriate, using GE Secretary/administrator at [facility]. Images are obtained for bone mineral density measurement and are not obtained for diagnostic purposes. [id number] Exclusions: Lumbar spine due to advanced degenerative changes COMPARISON:  04/29/2023 FINDINGS: Scan quality: Good. LEFT FEMORAL NECK: BMD (in g/cm2): 0.692 T-score: -2.5 Z-score: -1.4 LEFT TOTAL HIP: BMD (in g/cm2): 0.629 T-score: -3.0 Z-score: -2.2 RIGHT FEMORAL NECK: BMD (in g/cm2): 0.686 T-score: -2.5 Z-score: -1.4 RIGHT TOTAL HIP: BMD (in g/cm2): 0.611 T-score: -3.2 Z-score: -2.4 DUAL-FEMUR TOTAL MEAN: Rate of change from previous exam: No significant rate of change from previous exam. LEFT FOREARM (RADIUS 33%): BMD (in g/cm2): 0.558 T-score: -3.6 Z-score: -2.9 Rate of change from previous exam: No significant rate of change from previous exam. FRAX 10-YEAR PROBABILITY OF FRACTURE: FRAX not reported as the lowest BMD is not in the osteopenia range. IMPRESSION: Osteoporosis based on BMD. Fracture risk is unknown due to history of bone building therapy. RECOMMENDATIONS: 1. All patients should optimize calcium and vitamin D intake. 2. Consider FDA-approved medical therapies in postmenopausal women and men aged 35 years and older, based on the following: - A hip or vertebral (clinical or morphometric) fracture - T-score less than or equal to -2.5 and secondary causes have been excluded. - Low bone mass (T-score between -1.0 and -2.5) and a 10-year probability of a hip fracture greater than or equal to 3% or a 10-year probability of a major osteoporosis-related  fracture greater than or equal to 20% based on the [place] -adapted WHO algorithm. - Clinician judgment and/or patient preferences may indicate treatment for people with 10-year fracture probabilities above or below these levels 3. Patients with diagnosis of osteoporosis or at high risk for fracture should have regular bone mineral density tests. For patients eligible for Medicare, routine testing is allowed once every 2 years. The testing frequency can be increased to one year  for patients who have rapidly progressing disease, those who are receiving or discontinuing medical therapy to restore bone mass, or have additional risk factors. Electronically Signed   By: Sundra Engel M.D.   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 Because of positive margins she underwent reexcision on February 24, 2020 with clear margins.  She completed chemotherapy on August 24, 2020.  She subsequently also completed adjuvant XRT.  An aromatase inhibitor would not offer any benefit given the triple negative status of her disease.  Her most recent mammogram on May 13, 2024 is pending at time of dictation.  Return to clinic in 6 months for further evaluation.   Osteoporosis: Patient's most recent bone mineral density on May 13, 2024 reported no significant change from previous exam.  Because there is no improvement, will discontinue Fosamax  and patient will return to clinic in 1 week to receive Prolia.  Patient has been instructed to continue calcium and vitamin D supplementation.  No further interventions needed.  Return to clinic in 6 months as above with repeat laboratory, further evaluation, and continuation of treatment.  Repeat bone mineral density in June 2026.   Genetics:  Unclear if patient ever completed genetic testing.  I spent a total of 30 minutes reviewing chart data, face-to-face evaluation with the patient, counseling and coordination of care as detailed above.    Patient expressed understanding and was in agreement with this plan. She also understands that She can call clinic at any time with any questions, concerns, or complaints.    Cancer Staging  Carcinoma of upper-inner quadrant of left female breast [facility]) Staging form: Breast, AJCC 8th Edition - Clinical stage from 02/01/2020: Stage IB (cT1c, cN0, cM0, G3, ER-, PR-, HER2-) - Signed by Shellie Dials, MD on 02/01/2020 Stage prefix: Initial diagnosis Histologic grading system: 3 grade system   Shellie Dials, MD   05/17/2024 2:49 PM

## 2024-05-18 ENCOUNTER — Encounter: Payer: Self-pay | Admitting: Oncology

## 2024-05-18 DIAGNOSIS — M810 Age-related osteoporosis without current pathological fracture: Secondary | ICD-10-CM | POA: Insufficient documentation

## 2024-05-19 ENCOUNTER — Inpatient Hospital Stay: Admitting: Licensed Clinical Social Worker

## 2024-05-19 NOTE — Progress Notes (Signed)
 [facility] CSW Progress Note  Clinical Child psychotherapist contacted patient by phone to assess psychosocial needs.  Patient stated she is currently unemployed and seeking housing.  Provided her with the phone number for [employer], which she is familiar with.  She also has the application for Section 8 Housing but has not completed it yet.  Since patient is not currently in treatment, she does not qualify for the [facility].  Patient expressed no other needs.    Kennth Peal, LCSW Clinical Social Worker [facility]    Patient is participating in a Managed Medicaid Plan:  Yes

## 2024-05-24 ENCOUNTER — Inpatient Hospital Stay

## 2024-05-24 DIAGNOSIS — Z171 Estrogen receptor negative status [ER-]: Secondary | ICD-10-CM

## 2024-05-24 DIAGNOSIS — M810 Age-related osteoporosis without current pathological fracture: Secondary | ICD-10-CM | POA: Diagnosis not present

## 2024-05-24 LAB — BASIC METABOLIC PANEL - CANCER CENTER ONLY
Anion gap: 8 (ref 5–15)
BUN: 24 mg/dL — ABNORMAL HIGH (ref 6–20)
CO2: 25 mmol/L (ref 22–32)
Calcium: 8.5 mg/dL — ABNORMAL LOW (ref 8.9–10.3)
Chloride: 105 mmol/L (ref 98–111)
Creatinine: 0.9 mg/dL (ref 0.44–1.00)
GFR, Estimated: >60 mL/min (ref >60)
Glucose, Bld: 130 mg/dL — ABNORMAL HIGH (ref 70–99)
Potassium: 4.1 mmol/L (ref 3.5–5.1)
Sodium: 138 mmol/L (ref 135–145)

## 2024-05-24 MED ORDER — DENOSUMAB 60 MG/ML ~~LOC~~ SOSY
60.0000 mg | PREFILLED_SYRINGE | Freq: Once | SUBCUTANEOUS | Status: AC
  Administered 2024-05-24: 60 mg via SUBCUTANEOUS
  Filled 2024-05-24: qty 1

## 2024-05-24 NOTE — Progress Notes (Signed)
 MD approves to proceed with Prolia today with calcium 8.5 today.  It is documented in last office not that pt is to take Calcium

## 2024-05-31 ENCOUNTER — Other Ambulatory Visit: Payer: Self-pay | Admitting: Oncology

## 2024-06-03 ENCOUNTER — Encounter: Payer: Self-pay | Admitting: Oncology

## 2024-11-15 ENCOUNTER — Other Ambulatory Visit: Payer: Self-pay

## 2024-11-15 DIAGNOSIS — M810 Age-related osteoporosis without current pathological fracture: Secondary | ICD-10-CM

## 2024-11-15 DIAGNOSIS — C50212 Malignant neoplasm of upper-inner quadrant of left female breast: Secondary | ICD-10-CM

## 2024-11-16 ENCOUNTER — Encounter: Payer: Self-pay | Admitting: Oncology

## 2024-11-16 ENCOUNTER — Inpatient Hospital Stay: Attending: Oncology

## 2024-11-16 ENCOUNTER — Ambulatory Visit: Admitting: Oncology

## 2024-11-16 ENCOUNTER — Ambulatory Visit

## 2024-11-16 VITALS — BP 123/84 | HR 71 | Temp 97.2°F | Resp 18 | Wt 114.7 lb

## 2024-11-16 DIAGNOSIS — M810 Age-related osteoporosis without current pathological fracture: Secondary | ICD-10-CM

## 2024-11-16 DIAGNOSIS — Z79899 Other long term (current) drug therapy: Secondary | ICD-10-CM | POA: Diagnosis not present

## 2024-11-16 DIAGNOSIS — C50212 Malignant neoplasm of upper-inner quadrant of left female breast: Secondary | ICD-10-CM

## 2024-11-16 DIAGNOSIS — Z171 Estrogen receptor negative status [ER-]: Secondary | ICD-10-CM | POA: Diagnosis not present

## 2024-11-16 LAB — BASIC METABOLIC PANEL - CANCER CENTER ONLY
Anion gap: 10 (ref 5–15)
BUN: 33 mg/dL — ABNORMAL HIGH (ref 6–20)
CO2: 27 mmol/L (ref 22–32)
Calcium: 10 mg/dL (ref 8.9–10.3)
Chloride: 104 mmol/L (ref 98–111)
Creatinine: 0.93 mg/dL (ref 0.44–1.00)
GFR, Estimated: >60 mL/min (ref >60)
Glucose, Bld: 81 mg/dL (ref 70–99)
Potassium: 4.8 mmol/L (ref 3.5–5.1)
Sodium: 141 mmol/L (ref 135–145)

## 2024-11-16 MED ORDER — DENOSUMAB 60 MG/ML ~~LOC~~ SOSY
60.0000 mg | PREFILLED_SYRINGE | Freq: Once | SUBCUTANEOUS | Status: AC
  Administered 2024-11-16: 60 mg via SUBCUTANEOUS
  Filled 2024-11-16: qty 1

## 2024-11-16 NOTE — Progress Notes (Unsigned)
 Patient would like to get a referral to a different pulmonologist, she isn't happy with the pulmonologist that she has now. She would like to get a gyn appointment scheduled since she has been having so many UTI's recently back to back.She is also having some severe restless leg syndrome at night.

## 2024-11-16 NOTE — Progress Notes (Unsigned)
 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  INTERVAL HISTORY: Patient returns to clinic today for routine 64-month evaluation.  She currently feels well and is asymptomatic.  She continues to be active and work full-time. She has no neurologic complaints.  She has a good appetite and denies weight loss.  She has no chest pain, shortness of breath, or hemoptysis.  She denies any nausea, vomiting, constipation, or diarrhea.  She has no urinary complaints.  Patient offers no specific complaints today.  REVIEW OF SYSTEMS:   Review of Systems  Constitutional: Negative.  Negative for fever, malaise/fatigue and weight loss.  HENT:  Negative for congestion.   Respiratory: Negative.  Negative for cough, hemoptysis and shortness of breath.   Cardiovascular: Negative.  Negative for chest pain and leg swelling.  Gastrointestinal: Negative.  Negative for abdominal pain, diarrhea and nausea.  Genitourinary: Negative.  Negative for dysuria.  Musculoskeletal: Negative.  Negative for back pain.  Skin: Negative.  Negative for rash.  Neurological: Negative.  Negative for dizziness, focal weakness, weakness and headaches.  Psychiatric/Behavioral: Negative.  The patient is not nervous/anxious and does not have insomnia.     As per HPI. Otherwise, a complete review of systems is negative.  PAST MEDICAL HISTORY: Past Medical History:  Diagnosis Date  . Arthritis   . Asthmatic  bronchitis , chronic (HCC) 03/07/2022  . Breast cancer (HCC) 01/2020   left breast triple neagative IMC, HG DCIS  . Cellulitis   . COPD (chronic obstructive pulmonary disease) (HCC)   . Depression   . History of kidney stones   . History of tracheostomy 2000   stated that epiglottis swelled up for an unknown reason; had trach for about a week.   . Hypertension   . Kidney stone 04/12/2013  . Personal history of chemotherapy 2021   left breast ca  . 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 Pneumonia     PAST SURGICAL HISTORY: Past Surgical History:  Procedure Laterality Date  . arm surgery  1984   fractured ulna  . BREAST BIOPSY Left 01/2020   IMC 3:00 10cmfn us  bx  . BREAST LUMPECTOMY Left 02/04/2020   triple negative, IMC and HG DCIS with positive margins,  6 LN's negative   . BREAST LUMPECTOMY Left 02/24/2020   re excision for margins  . BREAST LUMPECTOMY WITH SENTINEL LYMPH NODE BIOPSY Left 02/04/2020   Procedure: BREAST LUMPECTOMY WITH SENTINEL LYMPH NODE BX;  Surgeon: Jordis Laneta FALCON, MD;  Location: [facility] ORS;  Service: General;  Laterality: Left;  . CLEFT LIP REPAIR    . FRACTURE SURGERY    . kidney stones    . lymphnode neck    . PORTACATH PLACEMENT N/A 02/04/2020   Procedure: INSERTION PORT-A-CATH;  Surgeon: Jordis Laneta FALCON, MD;  Location: [facility] ORS;  Service: General;  Laterality: N/A;  . RE-EXCISION OF BREAST LUMPECTOMY Left 02/24/2020   Procedure: RE-EXCISION OF Left BREAST LUMPECTOMY;  Surgeon: Jordis Laneta FALCON, MD;  Location: [facility]  ORS;  Service: General;  Laterality: Left;  . TYMPANOSTOMY TUBE PLACEMENT      FAMILY HISTORY: Family History  Problem Relation Age of Onset  . COPD Other   . COPD Mother   . Hypertension Mother   . COPD Father   . Breast cancer Neg Hx     ADVANCED DIRECTIVES (Y/N):  N  HEALTH MAINTENANCE: Social History   Tobacco Use  . Smoking status: Every Day    Current packs/day: 2.00     Average packs/day: 2.0 packs/day for 40.0 years (80.0 ttl pk-yrs)    Types: Cigarettes  . Smokeless tobacco: Never  . Tobacco comments:    1PPD 03/07/2022  Vaping Use  . Vaping status: Never Used  Substance Use Topics  . Alcohol use: No  . Drug use: No     Colonoscopy:  PAP:  Bone density:  Lipid panel:  Allergies  Allergen Reactions  . Sulfa Antibiotics Nausea And Vomiting    Current Outpatient Medications  Medication Sig Dispense Refill  . acetaminophen  (TYLENOL ) 500 MG tablet Take 2 tablets (1,000 mg total) by mouth every 6 (six) hours as needed for mild pain (pain score 1-3), moderate pain (pain score 4-6), fever or headache (or Fever >/= 101).    . CALCIUM-VITAMIN D PO Take by mouth.    SABRA ipratropium-albuterol  (DUONEB) 0.5-2.5 (3) MG/3ML SOLN Take 3 mLs by nebulization every 2 (two) hours as needed (wheeze, SOB). 120 mL 0  . lisinopril -hydrochlorothiazide (ZESTORETIC) 10-12.5 MG tablet Take 1 tablet by mouth daily.    . nicotine  (NICODERM CQ  - DOSED IN MG/24 HOURS) 21 mg/24hr patch Place 1 patch (21 mg total) onto the skin daily. 28 patch 0  . ondansetron  (ZOFRAN ) 8 MG tablet Take 0.5-1 tablets (4-8 mg total) by mouth every 6 (six) hours as needed for nausea or vomiting. 30 tablet 0  . SYMBICORT  160-4.5 MCG/ACT inhaler Inhale 2 puffs into the lungs in the morning and at bedtime. 1 each 1  . guaiFENesin  (MUCINEX ) 600 MG 12 hr tablet Take 2 tablets (1,200 mg total) by mouth 2 (two) times daily. (Patient not taking: Reported on 11/16/2024) 30 tablet 0  . HYDROcodone  bit-homatropine (HYCODAN) 5-1.5 MG/5ML syrup Take 5 mLs by mouth every 6 (six) hours as needed for cough. (Patient not taking: Reported on 11/16/2024) 120 mL 0  . loperamide  (IMODIUM  A-D) 2 MG tablet Take 1-2 tablets (2-4 mg total) by mouth 4 (four) times daily as needed for diarrhea or loose stools. (Patient not taking: Reported on 11/16/2024) 30 tablet 0  . predniSONE  (DELTASONE ) 20 MG tablet Take 2 tablets (40 mg total)  by mouth daily with breakfast. (Patient not taking: Reported on 11/16/2024) 2 tablet 0   No current facility-administered medications for this visit.   Facility-Administered Medications Ordered in Other Visits  Medication Dose Route Frequency Provider Last Rate Last Admin  . denosumab  (PROLIA ) injection 60 mg  60 mg Subcutaneous Once Daphnee Preiss J, MD      . sodium chloride  flush (NS) 0.9 % injection 10 mL  10 mL Intravenous PRN Aleira Deiter J, MD   10 mL at 06/01/20 0905  . sodium chloride  flush (NS) 0.9 % injection 10 mL  10 mL Intravenous PRN Julie-Ann Vanmaanen J, MD   10 mL at 08/24/20 0900    OBJECTIVE: Vitals:   11/16/24 1438  BP: 123/84  Pulse: 71  Resp: 18  Temp: (!) 97.2 F (36.2 C)  SpO2: 99%       Body  mass index is 19.09 kg/m.    ECOG FS:0 - Asymptomatic  General: Well-developed, well-nourished, no acute distress. Eyes: Pink conjunctiva, anicteric sclera. HEENT: Normocephalic, moist mucous membranes. Lungs: No audible wheezing or coughing. Heart: Regular rate and rhythm. Abdomen: Soft, nontender, no obvious distention. Musculoskeletal: No edema, cyanosis, or clubbing. Neuro: Alert, answering all questions appropriately. Cranial nerves grossly intact. Skin: No rashes or petechiae noted. Psych: Normal affect.   LAB RESULTS:  Lab Results  Component Value Date   NA 141 11/16/2024   K 4.8 11/16/2024   CL 104 11/16/2024   CO2 27 11/16/2024   GLUCOSE 81 11/16/2024   BUN 33 (H) 11/16/2024   CREATININE 0.93 11/16/2024   CALCIUM 10.0 11/16/2024   PROT 7.1 02/13/2024   ALBUMIN 2.7 (L) 02/13/2024   AST 18 02/13/2024   ALT 14 02/13/2024   ALKPHOS 81 02/13/2024   BILITOT 0.7 02/13/2024   GFRNONAA >60 11/16/2024   GFRAA >60 08/24/2020    Lab Results  Component Value Date   WBC 7.8 03/27/2024   NEUTROABS 4.4 05/09/2022   HGB 13.4 03/27/2024   HCT 41.4 03/27/2024   MCV 96.3 03/27/2024   PLT 314 03/27/2024     STUDIES: No results  found.   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 Because of positive margins she underwent reexcision on February 24, 2020 with clear margins.  She completed chemotherapy on August 24, 2020.  She subsequently also completed adjuvant XRT.  An aromatase inhibitor would not offer any benefit given the triple negative status of her disease.  Her most recent mammogram on May 13, 2024 is pending at time of dictation.  Return to clinic in 6 months for further evaluation.   Osteoporosis: Patient's most recent bone mineral density on May 13, 2024 reported no significant change from previous exam.  Because there is no improvement, will discontinue Fosamax  and patient will return to clinic in 1 week to receive Prolia .  Patient has been instructed to continue calcium and vitamin D supplementation.  No further interventions needed.  Return to clinic in 6 months as above with repeat laboratory, further evaluation, and continuation of treatment.  Repeat bone mineral density in June 2026.   Genetics: Unclear if patient ever completed genetic testing.  I spent a total of 30 minutes reviewing chart data, face-to-face evaluation with the patient, counseling and coordination of care as detailed above.    Patient expressed understanding and was in agreement with this plan. She also understands that She can call clinic at any time with any questions, concerns, or complaints.    Cancer Staging  Carcinoma of upper-inner quadrant of left female breast [facility]) Staging form: Breast, AJCC 8th Edition - Clinical stage from 02/01/2020: Stage IB (cT1c, cN0, cM0, G3, ER-, PR-, HER2-) - Signed by Jacobo Evalene PARAS, MD on 02/01/2020 Stage prefix: Initial diagnosis Histologic grading system: 3 grade  system   Evalene PARAS Jacobo, MD   11/16/2024 2:53 PM

## 2024-11-17 ENCOUNTER — Encounter: Payer: Self-pay | Admitting: Oncology

## 2024-12-13 ENCOUNTER — Encounter: Payer: Self-pay | Admitting: Emergency Medicine

## 2024-12-13 ENCOUNTER — Emergency Department

## 2024-12-13 ENCOUNTER — Emergency Department
Admission: EM | Admit: 2024-12-13 | Discharge: 2024-12-13 | Disposition: A | Discharge status: Home / Self Care | Attending: Emergency Medicine | Admitting: Emergency Medicine

## 2024-12-13 ENCOUNTER — Other Ambulatory Visit: Payer: Self-pay

## 2024-12-13 DIAGNOSIS — J449 Chronic obstructive pulmonary disease, unspecified: Secondary | ICD-10-CM | POA: Insufficient documentation

## 2024-12-13 DIAGNOSIS — Z853 Personal history of malignant neoplasm of breast: Secondary | ICD-10-CM | POA: Insufficient documentation

## 2024-12-13 DIAGNOSIS — H6011 Cellulitis of right external ear: Secondary | ICD-10-CM | POA: Diagnosis not present

## 2024-12-13 DIAGNOSIS — H9201 Otalgia, right ear: Secondary | ICD-10-CM | POA: Diagnosis present

## 2024-12-13 LAB — COMPREHENSIVE METABOLIC PANEL WITH GFR
ALT: 11 U/L (ref 0–44)
AST: 20 U/L (ref 15–41)
Albumin: 4 g/dL (ref 3.5–5.0)
Alkaline Phosphatase: 65 U/L (ref 38–126)
Anion gap: 11 (ref 5–15)
BUN: 28 mg/dL — ABNORMAL HIGH (ref 6–20)
CO2: 23 mmol/L (ref 22–32)
Calcium: 9.2 mg/dL (ref 8.9–10.3)
Chloride: 105 mmol/L (ref 98–111)
Creatinine, Ser: 1.03 mg/dL — ABNORMAL HIGH (ref 0.44–1.00)
GFR, Estimated: >60 mL/min
Glucose, Bld: 93 mg/dL (ref 70–99)
Potassium: 4 mmol/L (ref 3.5–5.1)
Sodium: 138 mmol/L (ref 135–145)
Total Bilirubin: 0.3 mg/dL (ref 0.0–1.2)
Total Protein: 6.8 g/dL (ref 6.5–8.1)

## 2024-12-13 LAB — CBC WITH DIFFERENTIAL/PLATELET
Abs Immature Granulocytes: 0.04 K/uL (ref 0.00–0.07)
Basophils Absolute: 0.1 K/uL (ref 0.0–0.1)
Basophils Relative: 1 %
Eosinophils Absolute: 0.2 K/uL (ref 0.0–0.5)
Eosinophils Relative: 2 %
HCT: 35.6 % — ABNORMAL LOW (ref 36.0–46.0)
Hemoglobin: 11.7 g/dL — ABNORMAL LOW (ref 12.0–15.0)
Immature Granulocytes: 0 %
Lymphocytes Relative: 22 %
Lymphs Abs: 1.9 K/uL (ref 0.7–4.0)
MCH: 32.2 pg (ref 26.0–34.0)
MCHC: 32.9 g/dL (ref 30.0–36.0)
MCV: 98.1 fL (ref 80.0–100.0)
Monocytes Absolute: 0.7 K/uL (ref 0.1–1.0)
Monocytes Relative: 8 %
Neutro Abs: 6 K/uL (ref 1.7–7.7)
Neutrophils Relative %: 67 %
Platelets: 276 K/uL (ref 150–400)
RBC: 3.63 MIL/uL — ABNORMAL LOW (ref 3.87–5.11)
RDW: 12.9 % (ref 11.5–15.5)
WBC: 8.9 K/uL (ref 4.0–10.5)
nRBC: 0 % (ref 0.0–0.2)

## 2024-12-13 MED ORDER — IOHEXOL 300 MG/ML  SOLN
75.0000 mL | Freq: Once | INTRAMUSCULAR | Status: AC | PRN
  Administered 2024-12-13: 75 mL via INTRAVENOUS

## 2024-12-13 MED ORDER — DEXAMETHASONE SOD PHOSPHATE PF 10 MG/ML IJ SOLN
10.0000 mg | Freq: Once | INTRAMUSCULAR | Status: AC
  Administered 2024-12-13: 10 mg via INTRAVENOUS

## 2024-12-13 MED ORDER — OXYCODONE-ACETAMINOPHEN 5-325 MG PO TABS: 1.0000 | ORAL_TABLET | Freq: Four times a day (QID) | ORAL | 0 refills | Status: AC | PRN

## 2024-12-13 MED ORDER — PREDNISONE 10 MG (21) PO TBPK: ORAL_TABLET | ORAL | 0 refills | Status: AC

## 2024-12-13 MED ORDER — MORPHINE SULFATE (PF) 4 MG/ML IV SOLN
4.0000 mg | Freq: Once | INTRAVENOUS | Status: AC
  Administered 2024-12-13: 4 mg via INTRAVENOUS
  Filled 2024-12-13: qty 1

## 2024-12-13 MED ORDER — CLINDAMYCIN HCL 300 MG PO CAPS: 300.0000 mg | ORAL_CAPSULE | Freq: Three times a day (TID) | ORAL | 0 refills | Status: AC

## 2024-12-13 MED ORDER — SODIUM CHLORIDE 0.9 % IV SOLN
2.0000 g | Freq: Once | INTRAVENOUS | Status: AC
  Administered 2024-12-13: 2 g via INTRAVENOUS
  Filled 2024-12-13: qty 20

## 2024-12-13 MED ORDER — HYDROMORPHONE HCL 1 MG/ML IJ SOLN
0.5000 mg | Freq: Once | INTRAMUSCULAR | Status: AC
  Administered 2024-12-13: 0.5 mg via INTRAVENOUS
  Filled 2024-12-13: qty 0.5

## 2024-12-13 MED ORDER — ONDANSETRON HCL 4 MG/2ML IJ SOLN
4.0000 mg | Freq: Once | INTRAMUSCULAR | Status: AC
  Administered 2024-12-13: 4 mg via INTRAVENOUS
  Filled 2024-12-13: qty 2

## 2024-12-13 NOTE — ED Provider Notes (Signed)
 "  [facility] Provider Note    Event Date/Time   First MD Initiated Contact with Patient 12/13/24 [phone]     (approximate)   History   Ear Pain   HPI  Deanna Ramsey is a 59 y.o. female with history of COPD, breast CA,  and as listed in EMR presents to the emergency department for treatment and evaluation of right ear pain and swelling. Pain started 2 days ago and she initially thought it was related to dental pain, but  yesterday evening she noticed some redness and swelling to the ear which worsened overnight and redness is now on right side of face. No known fever, but has had some chills. No relief with Benadryl , Tylenol , or Ibuprofen . No trauma to the ear.   Physical Exam    Vitals:   12/13/24 0759  BP: 121/81  Pulse: 92  Resp: 16  Temp: 97.8 F (36.6 C)  SpO2: 98%    General: Awake, no distress.  CV:  Good peripheral perfusion.  Resp:  Normal effort.  Abd:  No distention.  Other:  Auricle of right ear significantly swollen and very erythematous. No noted wounds or fluctuance. No edema of the EAC and TM appears normal. Right side of face mildly erythematous without edema. No difficulty swallowing.    ED Results / Procedures / Treatments   Labs (all labs ordered are listed, but only abnormal results are displayed)  Labs Reviewed  CBC WITH DIFFERENTIAL/PLATELET - Abnormal; Notable for the following components:      Result Value   RBC 3.63 (*)    Hemoglobin 11.7 (*)    HCT 35.6 (*)    All other components within normal limits  COMPREHENSIVE METABOLIC PANEL WITH GFR - Abnormal; Notable for the following components:   BUN 28 (*)    Creatinine, Ser 1.03 (*)    All other components within normal limits     EKG  Not indicated.   RADIOLOGY  Image and radiology report reviewed and interpreted by me. Radiology report consistent with the same.  CT shows diffuse thickening of the skin and subcutaneous fat of the right auricle with a 5 mm  soft tissue filling defect on the right external auditory canal most likely infectious/inflammatory (auricular cellulitis/perichondritis with debris/otitis externa or inflammatory polyp), with neoplasm((external auditory canal squamous cell carcinoma) less likely but to be excluded given age.  Shotty right posterior cervical submandibular and level 2 lymph nodes, favored to be reactive.  PROCEDURES:  Critical Care performed: No  Procedures   MEDICATIONS ORDERED IN ED:  Medications  morphine  (PF) 4 MG/ML injection 4 mg (4 mg Intravenous Given 12/13/24 0831)  ondansetron  (ZOFRAN ) injection 4 mg (4 mg Intravenous Given 12/13/24 0831)  iohexol  (OMNIPAQUE ) 300 MG/ML solution 75 mL (75 mLs Intravenous Contrast Given 12/13/24 0915)  dexamethasone  (DECADRON ) injection 10 mg (10 mg Intravenous Given 12/13/24 0956)  cefTRIAXone  (ROCEPHIN ) 2 g in sodium chloride  0.9 % 100 mL IVPB (0 g Intravenous Stopped 12/13/24 1027)  HYDROmorphone  (DILAUDID ) injection 0.5 mg (0.5 mg Intravenous Given 12/13/24 0956)     IMPRESSION / MDM / ASSESSMENT AND PLAN / ED COURSE   I have reviewed the triage note and vital signs. Vital signs stable   Differential diagnosis includes, but is not limited to, cellulitis, mastoiditis, skin cancer  Patient's presentation is most consistent with acute complicated illness / injury requiring diagnostic workup.  59 year old female presenting to the emergency department for treatment and evaluation of right ear pain,  redness, and swelling.  See HPI for further details.   On exam, right ear is significantly enlarged and erythematous.  TM is normal.  Right side of her face is mildly erythematous as well.  Airway is patent and voice is clear.  No cervical soft tissue tenderness.  Lab studies are overall reassuring.  BUN and creatinine are slightly elevated but at patient's baseline based on review of old labs.   CTA maxillofacial with contrast shows diffuse thickening of the skin and  subcutaneous fat of the right auricle with a 5 mm soft tissue filling defect in the right external auditory canal most likely infectious/inflammatory.  Auricular cellulitis/perichondritis with debris/otitis externa or inflammatory polyp.  Neoplasm not excluded.  While here, Rocephin  and Decadron  was given.  She will be discharged home on clindamycin , prednisone , and a short course of oxycodone .  She was strongly advised to call and schedule a follow-up appointment with ENT.  She was also advised if those symptoms worsen and she is unable to see the specialist, she needs to return to the emergency department immediately.  She is comfortable and agreeable to this plan.     FINAL CLINICAL IMPRESSION(S) / ED DIAGNOSES   Final diagnoses:  Cellulitis of auricle of right ear     Rx / DC Orders   ED Discharge Orders          Ordered    oxyCODONE -acetaminophen  (PERCOCET) 5-325 MG tablet  Every 6 hours PRN        12/13/24 1014    predniSONE  (STERAPRED UNI-PAK 21 TAB) 10 MG (21) TBPK tablet        12/13/24 1014    clindamycin  (CLEOCIN ) 300 MG capsule  3 times daily        12/13/24 1014    Ambulatory Referral to Primary Care (Establish Care)        12/13/24 1014             Note:  This document was prepared using Dragon voice recognition software and may include unintentional dictation errors.   Herlinda Kirk NOVAK, FNP 12/13/24 1334    Arlander Charleston, MD 12/13/24 1336  "

## 2024-12-13 NOTE — Discharge Instructions (Signed)
 Is very important that you call and schedule follow-up appointment with the ear, nose, and throat specialist.  The name and number are listed above.  If your symptoms change or worsen in any way and you are unable to see primary care or the specialist, return to the emergency department.

## 2024-12-13 NOTE — ED Notes (Signed)
 See triage note  Presents with right ear pain for a few days ago But noticed swelling and increased pain yesterday Afebrile on arrival Right red and swollen

## 2024-12-13 NOTE — ED Triage Notes (Addendum)
 C/O right ear pain and swelling x 1 day. Also C/O right facial pain. Has had sinus congestion for a while.  Right ear red and swollen.

## 2025-01-11 ENCOUNTER — Telehealth: Payer: Self-pay

## 2025-01-11 ENCOUNTER — Ambulatory Visit

## 2025-01-11 ENCOUNTER — Telehealth: Payer: Self-pay | Admitting: Oncology

## 2025-01-11 VITALS — BP 130/78 | HR 67 | Temp 98.0°F | Ht 64.0 in | Wt 117.4 lb

## 2025-01-11 DIAGNOSIS — J441 Chronic obstructive pulmonary disease with (acute) exacerbation: Secondary | ICD-10-CM

## 2025-01-11 DIAGNOSIS — F17210 Nicotine dependence, cigarettes, uncomplicated: Secondary | ICD-10-CM

## 2025-01-11 DIAGNOSIS — M810 Age-related osteoporosis without current pathological fracture: Secondary | ICD-10-CM

## 2025-01-11 DIAGNOSIS — C50212 Malignant neoplasm of upper-inner quadrant of left female breast: Secondary | ICD-10-CM

## 2025-01-11 DIAGNOSIS — G8929 Other chronic pain: Secondary | ICD-10-CM | POA: Insufficient documentation

## 2025-01-11 DIAGNOSIS — Z0000 Encounter for general adult medical examination without abnormal findings: Secondary | ICD-10-CM | POA: Insufficient documentation

## 2025-01-11 DIAGNOSIS — J4489 Other specified chronic obstructive pulmonary disease: Secondary | ICD-10-CM

## 2025-01-11 MED ORDER — ALBUTEROL SULFATE HFA 108 (90 BASE) MCG/ACT IN AERS: 2.0000 | INHALATION_SPRAY | Freq: Four times a day (QID) | RESPIRATORY_TRACT | 2 refills | Status: AC | PRN

## 2025-01-11 MED ORDER — MELOXICAM 7.5 MG PO TABS: 7.5000 mg | ORAL_TABLET | Freq: Every day | ORAL | 3 refills | Status: AC

## 2025-01-11 NOTE — Telephone Encounter (Signed)
 Patient instructions printed on printer up front.  I received these after patient had already checked-out, so I mailed them to her address on file.

## 2025-01-11 NOTE — Progress Notes (Unsigned)
 "  New Patient Office Visit  Subjective    Patient ID: Deanna Ramsey, female    DOB: Jun 26, 1966  Age: 59 y.o. MRN: [id number]  CC:  Chief Complaint  Patient presents with   Establish Care    disability    HPI Deanna Ramsey presents to establish care and wanting to change from [facility] in [place].   Pt interested in getting current with health maintenance items including vaccines PAP smear and colonoscopy.   C/O chronic Left hip pain 6/10 constant dull throbbing ache she manages with heat pad and ibuprohen. States that hip pain makes sitting standing and walking difficult. States that she has difficulty walking distances or sitting for any length of time  Pt with DEXA scan positive for oseoporosis on 11/16/2025. Osteoporosis managed by Dr Jacobo (oncologist) and takes 2x annual phosamax injections with last one being December 2025.  Pt states she is followed by pulmonology Dr Darlean and feels her COPD management is OK but that she needs a rescue inhaler for when Symbicort  doesn't work. States she gets SOB with walking in grocery store and with 1 flight of stairs. Would like a rescue albuterol  inhaler. Requests refills on Symbicort . Reports being a current smoker 3 cigarette a week and nicotine  patch.   HTN. doesn't check at home but BPshistorically  140s systolic in chart with BP today 130/78 HR of 67. Well managed this visit on lisinopril  10mg  and HCTZ12.5mg  daily.  Outpatient Encounter Medications as of 01/11/2025  Medication Sig   albuterol  (VENTOLIN  HFA) 108 (90 Base) MCG/ACT inhaler Inhale 2 puffs into the lungs every 6 (six) hours as needed for wheezing or shortness of breath.   meloxicam  (MOBIC ) 7.5 MG tablet Take 1 tablet (7.5 mg total) by mouth daily.   CALCIUM-VITAMIN D PO Take by mouth.   guaiFENesin  (MUCINEX ) 600 MG 12 hr tablet Take 2 tablets (1,200 mg total) by mouth 2 (two) times daily. (Patient not taking: Reported on 11/16/2024)    ipratropium-albuterol  (DUONEB) 0.5-2.5 (3) MG/3ML SOLN Take 3 mLs by nebulization every 2 (two) hours as needed (wheeze, SOB).   lisinopril -hydrochlorothiazide (ZESTORETIC) 10-12.5 MG tablet Take 1 tablet by mouth daily.   loperamide  (IMODIUM  A-D) 2 MG tablet Take 1-2 tablets (2-4 mg total) by mouth 4 (four) times daily as needed for diarrhea or loose stools. (Patient not taking: Reported on 11/16/2024)   nicotine  (NICODERM CQ  - DOSED IN MG/24 HOURS) 21 mg/24hr patch Place 1 patch (21 mg total) onto the skin daily.   ondansetron  (ZOFRAN ) 8 MG tablet Take 0.5-1 tablets (4-8 mg total) by mouth every 6 (six) hours as needed for nausea or vomiting.   oxyCODONE -acetaminophen  (PERCOCET) 5-325 MG tablet Take 1 tablet by mouth every 6 (six) hours as needed.   predniSONE  (STERAPRED UNI-PAK 21 TAB) 10 MG (21) TBPK tablet Take 6 tablets on the first day and decrease by 1 tablet each day until finished.   SYMBICORT  160-4.5 MCG/ACT inhaler Inhale 2 puffs into the lungs in the morning and at bedtime.   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   Pneumonia     Past Surgical History:  Procedure Laterality Date   arm surgery  1984   fractured ulna   BREAST BIOPSY Left 01/2020   IMC  3:00 10cmfn us  bx   BREAST LUMPECTOMY Left 02/04/2020   triple negative, IMC and HG DCIS with positive margins,  6 LN's negative    BREAST LUMPECTOMY Left 02/24/2020   re excision for margins   BREAST LUMPECTOMY WITH SENTINEL LYMPH NODE BIOPSY Left 02/04/2020   Procedure: BREAST LUMPECTOMY WITH SENTINEL LYMPH NODE BX;  Surgeon: Jordis Laneta FALCON, MD;  Location: [facility];  Service: General;  Laterality: Left;   CLEFT LIP REPAIR     FRACTURE SURGERY     kidney stones     lymphnode neck     PORTACATH PLACEMENT N/A 02/04/2020   Procedure: INSERTION PORT-A-CATH;  Surgeon: Jordis Laneta FALCON, MD;  Location: [facility];  Service: General;  Laterality: N/A;   RE-EXCISION OF BREAST LUMPECTOMY Left 02/24/2020   Procedure: RE-EXCISION OF Left BREAST LUMPECTOMY;  Surgeon: Jordis Laneta FALCON, MD;  Location: [facility];  Service: General;  Laterality: Left;   TYMPANOSTOMY TUBE PLACEMENT      Family History  Problem Relation Age of Onset   COPD Other    COPD Mother    Hypertension Mother    COPD Father    Breast cancer Neg Hx     Social History   Socioeconomic History   Marital status: Single    Spouse name: Not on file   Number of children: Not on file   Years of education: Not on file   Highest education level: Associate degree: occupational, scientist, product/process development, or vocational program  Occupational History   Not on file  Tobacco Use   Smoking status: Every Day    Current packs/day: 2.00    Average packs/day: 2.0 packs/day for 40.0 years (80.0 ttl pk-yrs)    Types: Cigarettes   Smokeless tobacco: Never   Tobacco comments:    1PPD 03/07/2022  Vaping Use   Vaping status: Never Used  Substance and Sexual Activity   Alcohol use: No   Drug use: No   Sexual activity: Not on file  Other Topics Concern   Not on file  Social History Narrative   Not on file   Social Drivers of Health   Tobacco Use: High Risk (12/13/2024)   Patient History    Smoking Tobacco Use: Every Day    Smokeless Tobacco Use: Never     Passive Exposure: Not on file  Financial Resource Strain: High Risk (01/06/2025)   Overall Financial Resource Strain (CARDIA)    Difficulty of Paying Living Expenses: Very hard  Food Insecurity: Food Insecurity Present (01/06/2025)   Epic    Worried About Programme Researcher, Broadcasting/film/video in the Last Year: Sometimes true    [employer] of Food in the Last Year: Sometimes true  Transportation Needs: Unmet Transportation Needs (01/06/2025)   Epic    Lack of Transportation (Medical): Yes    Lack of Transportation (Non-Medical): Yes  Physical Activity:  Insufficiently Active (01/06/2025)   Exercise Vital Sign    Days of Exercise per Week: 1 day    Minutes of Exercise per Session: 30 min  Stress: Stress Concern Present (01/06/2025)   [employer] of Occupational Health - Occupational Stress Questionnaire    Feeling of Stress: To some extent  Social Connections: Socially Isolated (01/06/2025)   Social Connection and Isolation Panel    Frequency of Communication with Friends and Family: Once a week    Frequency of Social Gatherings with Friends and Family: Once a week    Attends Religious Services: 1 to 4 times per year    Active Member of [employer] or Organizations: No    Attends Engineer, Structural: Not on file    Marital Status: Divorced  Intimate Partner Violence: Not At Risk (02/13/2024)   Humiliation, Afraid, Rape, and Kick questionnaire    Fear of Current or Ex-Partner: No    Emotionally Abused: No    Physically Abused: No    Sexually Abused: No  Depression (PHQ2-9): Low Risk (01/11/2025)   Depression (PHQ2-9)    PHQ-2 Score: 0  Alcohol Screen: Not on file  Housing: High Risk (01/06/2025)   Epic    Unable to Pay for Housing in the Last Year: Yes    Number of Times Moved in the Last Year: 3    Homeless in the Last Year: Yes  Utilities: At Risk (02/13/2024)   [employer] Utilities    Threatened with loss of utilities: Yes  Health Literacy: Not on file    Review of Systems  Constitutional: Negative.   Negative for fever.  HENT:  Negative for congestion, ear discharge, ear pain, hearing loss, sinus pain and tinnitus.   Eyes: Negative.   Respiratory: Negative.    Cardiovascular: Negative.   Gastrointestinal: Negative.   Genitourinary: Negative.        History of chronic UTIs last UTI early December  Musculoskeletal:  Negative for back pain, joint pain and myalgias.       Mild discomfort with ROM of right hip. Stable without crepitusmildly reduced ROM  Skin: Negative.        Right ear slightly dusky from cellulitis visit to ED 13 Dec 2024. Neg pain with exam of auricle. Mild cerumen buildup in canal unable to visualize TM. Denies pain or any further problems. Neg edema  Neurological: Negative.   Endo/Heme/Allergies: Negative.   Psychiatric/Behavioral: Negative.  Negative for depression and suicidal ideas.         Objective    BP 130/78   Pulse 67   Temp 98 F (36.7 C) (Oral)   Ht 5' 4 (1.626 m)   Wt 117 lb 6.4 oz (53.3 kg)   LMP 02/28/2012   SpO2 94%   BMI 20.15 kg/m   Physical Exam Vitals and nursing note reviewed.  Constitutional:      Appearance: Normal appearance. She is normal weight.  HENT:     Head: Normocephalic and atraumatic.     Right Ear: Tympanic membrane, ear canal and external ear normal.     Left Ear: Ear canal normal.     Ears:     Comments: Unable to visualize because of cerumen buildup. Auricle mildly dusky noty tender to palpation neg edema    Nose: Nose normal.     Mouth/Throat:     Mouth: Mucous membranes are moist.     Pharynx: Oropharynx is clear.  Eyes:     Extraocular Movements: Extraocular movements intact.  Conjunctiva/sclera: Conjunctivae normal.  Cardiovascular:     Rate and Rhythm: Normal rate and regular rhythm.     Pulses: Normal pulses.     Heart sounds: Normal heart sounds.  Pulmonary:     Effort: Pulmonary effort is normal.     Breath sounds: Normal breath sounds.  Abdominal:     General: Abdomen is flat.   Musculoskeletal:        General: No swelling, tenderness, deformity or signs of injury.     Cervical back: Normal range of motion.     Right lower leg: No edema.     Left lower leg: No edema.     Comments: Favors L Hip sitting during exam and grimace with ROM and reduced lateral, fexion and extension of Left hip ROM   Skin:    General: Skin is warm.     Capillary Refill: Capillary refill takes less than 2 seconds.  Neurological:     Mental Status: She is alert and oriented to person, place, and time. Mental status is at baseline.  Psychiatric:        Mood and Affect: Mood normal.        Behavior: Behavior normal.     {Labs (Optional):23779}    Assessment & Plan:   Problem List Items Addressed This Visit       Respiratory   Asthmatic bronchitis , chronic (HCC)   COPD with acute exacerbation (HCC)   COPD and asthmatic Bronchitis symptoms and SOB limit activity and ADLs. Managed by Dr Darlean pulmonology Relevant Medications       Continue Symbicort  inhaler as directed by pulmonologist. Refills on file with pharmacy   Order albuterol  (VENTOLIN  HFA) 108 [phone] Base) MCG/ACT inhaler     Musculoskeletal and Integument   Osteoporosis   Managed by Dr Jacobo with positive Dexa scan for osteoporosis December 2025 and bi-annual injections of osteoporosis injections. 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 Pt declined any cessation counseling or adjuncts at thsi time.   Healthcare maintenance - Primary   Relevant Orders   Tdap vaccine greater than or equal to 7yo IM (Completed)   Varicella-zoster vaccine IM (Shingrix) (Completed) Pt to schedule PAP smear for 2 months at this clinic and wants to check with [facility] to have done there as an  alternate  Will discuss schedule of colonoscopy next visit Will discuss Hep B vaccine next visit as well as Hep C screening   Chronic hip pain, left   Mild impairment of ADLs and mobility. With impact on quality of life  Relevant Medications   meloxicam  (MOBIC ) 7.5 MG tablet (Consider increasing to 15mg  daily if no improvement in comfort or pain level.    Return in about 2 months (around 03/11/2025), or for PAP smear.   Deanna JONETTA Huh, Deanna Ramsey   "

## 2025-01-11 NOTE — Telephone Encounter (Signed)
 Pt called and wanted to speak with Christy B. Stated she needs to schedule her pap smear. Please call with an update. Thank you

## 2025-02-08 ENCOUNTER — Ambulatory Visit

## 2025-05-16 ENCOUNTER — Other Ambulatory Visit

## 2025-05-16 ENCOUNTER — Encounter

## 2025-05-24 ENCOUNTER — Inpatient Hospital Stay

## 2025-05-24 ENCOUNTER — Inpatient Hospital Stay: Admitting: Oncology
